# Patient Record
Sex: Male | Born: 1975 | Hispanic: Yes | Marital: Married | Smoking: Former smoker
Health system: Southern US, Community
[De-identification: ages and names within clinical notes are randomized; demographics above are authoritative.]

## PROBLEM LIST (undated history)

## (undated) DIAGNOSIS — T7840XA Allergy, unspecified, initial encounter: Secondary | ICD-10-CM

## (undated) DIAGNOSIS — K469 Unspecified abdominal hernia without obstruction or gangrene: Secondary | ICD-10-CM

## (undated) DIAGNOSIS — E119 Type 2 diabetes mellitus without complications: Secondary | ICD-10-CM

## (undated) DIAGNOSIS — K219 Gastro-esophageal reflux disease without esophagitis: Secondary | ICD-10-CM

## (undated) DIAGNOSIS — I10 Essential (primary) hypertension: Secondary | ICD-10-CM

## (undated) DIAGNOSIS — E785 Hyperlipidemia, unspecified: Secondary | ICD-10-CM

## (undated) HISTORY — DX: Unspecified abdominal hernia without obstruction or gangrene: K46.9

## (undated) HISTORY — DX: Essential (primary) hypertension: I10

## (undated) HISTORY — DX: Gastro-esophageal reflux disease without esophagitis: K21.9

## (undated) HISTORY — DX: Hyperlipidemia, unspecified: E78.5

## (undated) HISTORY — DX: Type 2 diabetes mellitus without complications: E11.9

## (undated) HISTORY — DX: Allergy, unspecified, initial encounter: T78.40XA

---

## 1999-12-14 ENCOUNTER — Emergency Department (HOSPITAL_COMMUNITY): Admission: EM | Admit: 1999-12-14 | Discharge: 1999-12-14 | Payer: Self-pay | Admitting: *Deleted

## 2010-07-26 HISTORY — PX: INGUINAL HERNIA REPAIR: SUR1180

## 2011-04-27 ENCOUNTER — Ambulatory Visit (INDEPENDENT_AMBULATORY_CARE_PROVIDER_SITE_OTHER): Payer: BC Managed Care – PPO | Admitting: Surgery

## 2011-04-27 ENCOUNTER — Encounter (INDEPENDENT_AMBULATORY_CARE_PROVIDER_SITE_OTHER): Payer: Self-pay | Admitting: Surgery

## 2011-04-27 VITALS — BP 140/86 | HR 60 | Temp 99.0°F | Resp 12 | Ht 69.5 in | Wt 234.0 lb

## 2011-04-27 DIAGNOSIS — K4090 Unilateral inguinal hernia, without obstruction or gangrene, not specified as recurrent: Secondary | ICD-10-CM | POA: Insufficient documentation

## 2011-04-27 NOTE — Progress Notes (Signed)
Chief Complaint  Patient presents with  . Other    new pt- eval supra pubic hernia    HPI Dominic [name] is a 35 y.o. male.   HPI Patient referred by Dr. Providence Lanius for evaluation of right groin pain and a right groin bulge. He has noticed it for several weeks. He is in increasingly worsening groin discomfort. He has had no obstructive symptoms. He reports that it always easily reduces. Past Medical History  Diagnosis Date  . Hyperlipidemia   . Hernia     History reviewed. No pertinent past surgical history.  History reviewed. No pertinent family history.  Social History History  Substance Use Topics  . Smoking status: Never Smoker   . Smokeless tobacco: Not on file  . Alcohol Use: 7.2 oz/week    12 Cans of beer per week    No Known Allergies  No current outpatient prescriptions on file.    Review of Systems Review of Systems  HENT: Negative.   Eyes: Negative.   Respiratory: Negative.   Cardiovascular: Negative.   Gastrointestinal: Negative.   Genitourinary: Negative.   Musculoskeletal: Negative.   Neurological: Negative.   Hematological: Negative.   Psychiatric/Behavioral: Negative.     Blood pressure 140/86, pulse 60, temperature 99 F (37.2 C), temperature source Temporal, resp. rate 12, height 5' 9.5" (1.765 m), weight 234 lb (106.142 kg).  Physical Exam Physical Exam  Constitutional: He is oriented to person, place, and time. He appears well-nourished. No distress.  HENT:  Head: Normocephalic and atraumatic.  Right Ear: External ear normal.  Left Ear: External ear normal.  Nose: Nose normal.  Mouth/Throat: Oropharynx is clear and moist. No oropharyngeal exudate.  Eyes: Conjunctivae are normal. Pupils are equal, round, and reactive to light. No scleral icterus.  Neck: Normal range of motion. Neck supple. No tracheal deviation present. No thyromegaly present.  Cardiovascular: Normal rate, regular rhythm, normal heart sounds and intact distal pulses.   No  murmur heard. Pulmonary/Chest: Effort normal and breath sounds normal. No respiratory distress. He has no wheezes. He has no rales.  Abdominal: Soft. Bowel sounds are normal. He exhibits no distension. There is no tenderness. A hernia is present. Hernia confirmed positive in the right inguinal area. Hernia confirmed negative in the left inguinal area.  Musculoskeletal: Normal range of motion. He exhibits no edema and no tenderness.  Lymphadenopathy:    He has no cervical adenopathy.  Neurological: He is alert and oriented to person, place, and time.  Skin: Skin is warm and dry. No rash noted. No erythema.  Psychiatric: His behavior is normal. Judgment normal.    Data Reviewed   Assessment    Patient with easily reducible symptomatic right inguinal hernia    Plan    Repair is recommended with mesh. I discussed both the laparoscopic and open techniques. He wishes to proceed with laparoscopic right inguinal hernia repair with mesh. I discussed the risks of surgery which include but are not limited to bleeding, infection, chronic pain, nerve entrapment, recurrence, etc. Surgery will be scheduled       Kamla Skilton A 04/27/2011, 11:07 AM

## 2011-05-03 DIAGNOSIS — K4090 Unilateral inguinal hernia, without obstruction or gangrene, not specified as recurrent: Secondary | ICD-10-CM

## 2011-05-11 ENCOUNTER — Encounter (INDEPENDENT_AMBULATORY_CARE_PROVIDER_SITE_OTHER): Payer: Self-pay | Admitting: General Surgery

## 2011-05-13 ENCOUNTER — Encounter (INDEPENDENT_AMBULATORY_CARE_PROVIDER_SITE_OTHER): Payer: Self-pay | Admitting: Surgery

## 2011-05-17 ENCOUNTER — Ambulatory Visit (INDEPENDENT_AMBULATORY_CARE_PROVIDER_SITE_OTHER): Payer: BC Managed Care – PPO | Admitting: Surgery

## 2011-05-17 ENCOUNTER — Encounter (INDEPENDENT_AMBULATORY_CARE_PROVIDER_SITE_OTHER): Payer: Self-pay | Admitting: Surgery

## 2011-05-17 VITALS — BP 142/88 | HR 64 | Temp 99.0°F | Resp 12 | Ht 69.5 in | Wt 230.4 lb

## 2011-05-17 DIAGNOSIS — Z09 Encounter for follow-up examination after completed treatment for conditions other than malignant neoplasm: Secondary | ICD-10-CM

## 2011-05-17 NOTE — Progress Notes (Signed)
Subjective:     Patient ID: Dominic [name], male   DOB: [birth year], 35 y.o.   MRN: [id number]  HPI He is here for his first postoperative visit status post laparoscopic right inguinal hernia repair with mesh. He is doing well and has only mild discomfort  Review of Systems     Objective:   Physical Exam On examination, his incisions are healing well. There is some mild swelling along the testicular cord on the right side but no evidence of recurrent hernia.    Assessment:     Patient status post laparoscopic right inguinal hernia repair with mesh    Plan:     I gave him a note to return to work tomorrow. He will still refrain from heavy lifting for one more week. I will see him back as needed

## 2011-08-17 ENCOUNTER — Ambulatory Visit (INDEPENDENT_AMBULATORY_CARE_PROVIDER_SITE_OTHER): Payer: BC Managed Care – PPO | Admitting: Surgery

## 2011-09-20 ENCOUNTER — Ambulatory Visit (INDEPENDENT_AMBULATORY_CARE_PROVIDER_SITE_OTHER): Payer: BC Managed Care – PPO | Admitting: Surgery

## 2011-09-20 ENCOUNTER — Encounter (INDEPENDENT_AMBULATORY_CARE_PROVIDER_SITE_OTHER): Payer: Self-pay | Admitting: Surgery

## 2011-09-20 VITALS — BP 142/104 | HR 68 | Temp 98.6°F | Resp 16 | Ht 69.0 in | Wt 242.4 lb

## 2011-09-20 DIAGNOSIS — R1031 Right lower quadrant pain: Secondary | ICD-10-CM

## 2011-09-20 NOTE — Progress Notes (Signed)
Subjective:     Patient ID: Dominic [name], male   DOB: [birth year], 36 y.o.   MRN: [id number]  HPI He is here for another postoperative visit. He underwent a laparoscopic right inguinal hernia repair with mesh on May 07, 2011. He is having some discomfort in the right groin. He has noticed a small nodule along his testicular cord. He did not hurt every day. He has been doing heavy lifting. He has no obstructive symptoms  Review of Systems     Objective:   Physical Exam On exam, I can feel no evidence of recurrent hernia. Along the testicular cord and the scrotum there is a 1 cm round nodule which is tender and deep. It feels mobile and like it is along the cord.    Assessment:     Right groin pain of uncertain etiology    Plan:     I'm going to place him on anti-inflammatories and Ultram. He will refrain from heavy lifting. I'll see him back for recheck. If this persists I will get an ultrasound to evaluate the nodule

## 2011-10-11 ENCOUNTER — Other Ambulatory Visit (INDEPENDENT_AMBULATORY_CARE_PROVIDER_SITE_OTHER): Payer: Self-pay | Admitting: Surgery

## 2011-10-11 ENCOUNTER — Telehealth (INDEPENDENT_AMBULATORY_CARE_PROVIDER_SITE_OTHER): Payer: Self-pay | Admitting: Surgery

## 2011-10-11 DIAGNOSIS — K469 Unspecified abdominal hernia without obstruction or gangrene: Secondary | ICD-10-CM

## 2011-10-11 NOTE — Telephone Encounter (Signed)
Pt called and states that had hernia sx about 33mo ago, last time he came for a reck he told Dr. Dorthea Cove about a small bulge he felt in the incision, he was told that it was nothing to worry about.  He states he was also told that if necessary he would order an ultra sound if necessary, he feels the bulge and it is bothering him, he does feel some pain and would like to have an [name].  He is in [place] working right now, he is wondering if he could have that scheduled and possible see Dr. Dorthea Cove on the same day since he has to come back from [place], this way he's not going back and forth.

## 2011-10-25 ENCOUNTER — Other Ambulatory Visit (INDEPENDENT_AMBULATORY_CARE_PROVIDER_SITE_OTHER): Payer: Self-pay | Admitting: Surgery

## 2011-10-25 ENCOUNTER — Ambulatory Visit
Admission: RE | Admit: 2011-10-25 | Discharge: 2011-10-25 | Disposition: A | Discharge status: Home / Self Care | Payer: BC Managed Care – PPO | Source: Ambulatory Visit | Attending: Surgery | Admitting: Surgery

## 2011-10-25 DIAGNOSIS — K469 Unspecified abdominal hernia without obstruction or gangrene: Secondary | ICD-10-CM

## 2011-10-26 ENCOUNTER — Encounter (INDEPENDENT_AMBULATORY_CARE_PROVIDER_SITE_OTHER): Payer: Self-pay | Admitting: Surgery

## 2011-10-26 ENCOUNTER — Ambulatory Visit (INDEPENDENT_AMBULATORY_CARE_PROVIDER_SITE_OTHER): Payer: BC Managed Care – PPO | Admitting: Surgery

## 2011-10-26 VITALS — BP 123/76 | HR 84 | Temp 98.0°F | Resp 20 | Ht 69.0 in | Wt 241.8 lb

## 2011-10-26 DIAGNOSIS — R1031 Right lower quadrant pain: Secondary | ICD-10-CM

## 2011-10-26 NOTE — Progress Notes (Signed)
Subjective:     Patient ID: Dominic [name], male   DOB: 06-Dec-1975, 36 y.o.   MRN: [id number]  HPI He continues to have slight pain in his right groin. It is worse when bending and sitting for long periods of time. The pain is described as sharp. He will intermittently noticed a very Small buldge  Review of Systems     Objective:   Physical Exam On exam, there is still some tenderness in the groin. I can feel a small fullness along the testicular cord but cannot reduce this area. His ultrasound suggests this may be some incarcerated fat in the testicular cord    Assessment:     Patient with right groin pain of uncertain etiology    Plan:     This may represent recurrent hernia versus nerve entrapment. I discussed getting a CAT scan versus expectant management versus groin exploration. He wishes that I proceed with groin exploration which feels very reasonable. I discussed the risks of surgery with him in detail. Surgery will thus be scheduled

## 2011-10-29 ENCOUNTER — Telehealth (INDEPENDENT_AMBULATORY_CARE_PROVIDER_SITE_OTHER): Payer: Self-pay | Admitting: General Surgery

## 2011-10-29 NOTE — Telephone Encounter (Signed)
Called in Rx to [facility] on [address] in [place]. [place], [phone] spoke to [place] and called in Hydrocodone 5/325 mg #30 with out refills ok per Dr Magnus Ivan

## 2011-11-08 ENCOUNTER — Other Ambulatory Visit (INDEPENDENT_AMBULATORY_CARE_PROVIDER_SITE_OTHER): Payer: Self-pay | Admitting: Surgery

## 2011-11-08 ENCOUNTER — Encounter (INDEPENDENT_AMBULATORY_CARE_PROVIDER_SITE_OTHER): Payer: Self-pay | Admitting: Surgery

## 2011-11-15 ENCOUNTER — Telehealth (INDEPENDENT_AMBULATORY_CARE_PROVIDER_SITE_OTHER): Payer: Self-pay

## 2011-11-15 ENCOUNTER — Encounter (INDEPENDENT_AMBULATORY_CARE_PROVIDER_SITE_OTHER): Payer: Self-pay

## 2011-11-15 ENCOUNTER — Encounter (HOSPITAL_COMMUNITY): Payer: Self-pay

## 2011-11-15 ENCOUNTER — Encounter (HOSPITAL_COMMUNITY)
Admission: RE | Admit: 2011-11-15 | Discharge: 2011-11-15 | Disposition: A | Discharge status: Home / Self Care | Payer: BC Managed Care – PPO | Source: Ambulatory Visit | Attending: Surgery | Admitting: Surgery

## 2011-11-15 LAB — SURGICAL PCR SCREEN: MRSA, PCR: NEGATIVE

## 2011-11-15 LAB — CBC
HCT: 49 % (ref 39.0–52.0)
Hemoglobin: 17.1 g/dL — ABNORMAL HIGH (ref 13.0–17.0)
WBC: 7.1 10*3/uL (ref 4.0–10.5)

## 2011-11-15 NOTE — Patient Instructions (Signed)
20 Dominic [name]  11/15/2011   Your procedure is scheduled on:  Friday 11/19/2011 at 0730  Report to [facility] at 0530 AM.  Call this number if you have problems the morning of surgery: [phone]   Remember:   Do not eat food:After Midnight.  May have clear liquids:until Midnight .  [name]  Take these medicines the morning of surgery with A SIP OF WATER: none   Do not wear jewelry  Do not wear lotions, powders, or perfumes.  Do not shave 48 hours prior to surgery(women only-shaving legs)  Do not bring valuables to the hospital.  Contacts, dentures or bridgework may not be worn into surgery.  Leave suitcase in the car. After surgery it may be brought to your room.  For patients admitted to the hospital, checkout time is 11:00 AM the day of discharge.   Patients discharged the day of surgery will not be allowed to drive home.  Name and phone number of your driver: Oscar [phone]  Special Instructions: CHG Shower Use Special Wash: 1/2 bottle night before surgery and 1/2 bottle morning of surgery.   Please read over the following fact sheets that you were given: MRSA Information, incentive spirometry sheet, sleep apnea sheet

## 2011-11-15 NOTE — Pre-Procedure Instructions (Signed)
[facility] and talked to Colman Cater, RN to let Pattricia Boss inform Dr. Abigail Miyamoto of abnormal labs CBC in EPIC.

## 2011-11-15 NOTE — Telephone Encounter (Signed)
The patient called in and spoke to [place] because he speaks [place].  He is asking to fly to [place] postop to recover from his surgery 4/26 groin exploration.  He wanted to go a week postop but I advised at least 2 weeks in case he postop complications.

## 2011-11-18 NOTE — H&P (Signed)
Dominic [name] is an 36 y.o. male.   Chief Complaint: right groin pain HPI: patient has had previous right inguinal hernia repaired with mesh.  Now with groin pain and possible bulge.  Ultrasound suggests possible hernia containing fat.  Past Medical History  Diagnosis Date  . Hyperlipidemia   . Hernia     Past Surgical History  Procedure Date  . Inguinal hernia repair 2012    right    No family history on file. Social History:  reports that he has been smoking Cigarettes.  He has a .75 pack-year smoking history. He does not have any smokeless tobacco history on file. He reports that he drinks alcohol. He reports that he does not use illicit drugs.  Allergies: No Known Allergies  No prescriptions prior to admission    No results found for this or any previous visit (from the past 48 hour(s)). No results found.  Review of Systems  All other systems reviewed and are negative.    There were no vitals taken for this visit. Physical Exam  Constitutional: He is oriented to person, place, and time. He appears well-developed and well-nourished. No distress.  Eyes: Conjunctivae are normal. Pupils are equal, round, and reactive to light.  Neck: Normal range of motion. Neck supple. No tracheal deviation present.  Cardiovascular: Normal rate, regular rhythm, normal heart sounds and intact distal pulses.   No murmur heard. Respiratory: Effort normal and breath sounds normal. No respiratory distress.  GI: Soft. Bowel sounds are normal. He exhibits no distension. There is no tenderness.       Slight fullness in right groin with tenderness  Musculoskeletal: Normal range of motion. He exhibits no edema and no tenderness.  Neurological: He is alert and oriented to person, place, and time.  Skin: Skin is warm and dry.     Assessment/Plan Patient with right groin pain, possible recurrent hernia.  Plan right groin exploration and hernia repair with mesh if hernia is present.  Risks  discussed in detail including but not limited to bleeding, infection, never entrapment, chronic pain, recurrence, etc. Discussed.  Likelihood of success unknown.  Tesla Bochicchio A 11/18/2011, 9:47 PM

## 2011-11-18 NOTE — Anesthesia Preprocedure Evaluation (Addendum)
Anesthesia Evaluation  Patient identified by MRN, date of birth, ID band Patient awake    Reviewed: Allergy & Precautions, H&P , NPO status , Patient's Chart, lab work & pertinent test results  Airway Mallampati: II TM Distance: >3 FB Neck ROM: full    Dental No notable dental hx. (+) Teeth Intact, Dental Advisory Given and Caps,    Pulmonary neg pulmonary ROS,  breath sounds clear to auscultation  Pulmonary exam normal       Cardiovascular Exercise Tolerance: Good negative cardio ROS  Rhythm:regular Rate:Normal     Neuro/Psych negative neurological ROS  negative psych ROS   GI/Hepatic negative GI ROS, Neg liver ROS,   Endo/Other  negative endocrine ROS  Renal/GU negative Renal ROS  negative genitourinary   Musculoskeletal   Abdominal   Peds  Hematology negative hematology ROS (+)   Anesthesia Other Findings   Reproductive/Obstetrics negative OB ROS                          Anesthesia Physical Anesthesia Plan  ASA: I  Anesthesia Plan: General   Post-op Pain Management:    Induction: Intravenous  Airway Management Planned: LMA  Additional Equipment:   Intra-op Plan:   Post-operative Plan:   Informed Consent: I have reviewed the patients History and Physical, chart, labs and discussed the procedure including the risks, benefits and alternatives for the proposed anesthesia with the patient or authorized representative who has indicated his/her understanding and acceptance.   Dental Advisory Given  Plan Discussed with: CRNA and Surgeon  Anesthesia Plan Comments:         Anesthesia Quick Evaluation  

## 2011-11-19 ENCOUNTER — Encounter (HOSPITAL_COMMUNITY)
Admission: RE | Disposition: A | Discharge status: Home / Self Care | Payer: Self-pay | Source: Ambulatory Visit | Attending: Surgery

## 2011-11-19 ENCOUNTER — Ambulatory Visit (HOSPITAL_COMMUNITY): Payer: BC Managed Care – PPO | Admitting: Anesthesiology

## 2011-11-19 ENCOUNTER — Ambulatory Visit (HOSPITAL_COMMUNITY)
Admission: RE | Admit: 2011-11-19 | Discharge: 2011-11-19 | Disposition: A | Discharge status: Home / Self Care | Payer: BC Managed Care – PPO | Source: Ambulatory Visit | Attending: Surgery | Admitting: Surgery

## 2011-11-19 ENCOUNTER — Encounter (HOSPITAL_COMMUNITY): Payer: Self-pay | Admitting: Anesthesiology

## 2011-11-19 ENCOUNTER — Encounter (HOSPITAL_COMMUNITY): Payer: Self-pay | Admitting: *Deleted

## 2011-11-19 DIAGNOSIS — K4090 Unilateral inguinal hernia, without obstruction or gangrene, not specified as recurrent: Secondary | ICD-10-CM | POA: Insufficient documentation

## 2011-11-19 DIAGNOSIS — K4091 Unilateral inguinal hernia, without obstruction or gangrene, recurrent: Secondary | ICD-10-CM

## 2011-11-19 DIAGNOSIS — D214 Benign neoplasm of connective and other soft tissue of abdomen: Secondary | ICD-10-CM | POA: Insufficient documentation

## 2011-11-19 HISTORY — PX: INGUINAL HERNIA REPAIR: SHX194

## 2011-11-19 HISTORY — PX: GROIN DISSECTION: SHX5250

## 2011-11-19 SURGERY — EXPLORATION, INGUINAL REGION
Anesthesia: General | Site: Groin | Laterality: Right | Wound class: Clean

## 2011-11-19 MED ORDER — LIDOCAINE HCL (CARDIAC) 20 MG/ML IV SOLN
INTRAVENOUS | Status: DC | PRN
  Administered 2011-11-19: 100 mg via INTRAVENOUS

## 2011-11-19 MED ORDER — MIDAZOLAM HCL 5 MG/5ML IJ SOLN
INTRAMUSCULAR | Status: DC | PRN
  Administered 2011-11-19: 2 mg via INTRAVENOUS

## 2011-11-19 MED ORDER — 0.9 % SODIUM CHLORIDE (POUR BTL) OPTIME
TOPICAL | Status: DC | PRN
  Administered 2011-11-19: 1000 mL

## 2011-11-19 MED ORDER — OXYCODONE HCL 5 MG PO TABS
ORAL_TABLET | ORAL | Status: AC
  Administered 2011-11-19: 5 mg
  Filled 2011-11-19: qty 1

## 2011-11-19 MED ORDER — FENTANYL CITRATE 0.05 MG/ML IJ SOLN
INTRAMUSCULAR | Status: AC
  Filled 2011-11-19: qty 2

## 2011-11-19 MED ORDER — ACETAMINOPHEN 10 MG/ML IV SOLN
INTRAVENOUS | Status: AC
  Filled 2011-11-19: qty 100

## 2011-11-19 MED ORDER — FENTANYL CITRATE 0.05 MG/ML IJ SOLN
INTRAMUSCULAR | Status: DC | PRN
  Administered 2011-11-19 (×5): 50 ug via INTRAVENOUS

## 2011-11-19 MED ORDER — LACTATED RINGERS IV SOLN
INTRAVENOUS | Status: DC
  Administered 2011-11-19: 1000 mL via INTRAVENOUS

## 2011-11-19 MED ORDER — DEXAMETHASONE SODIUM PHOSPHATE 10 MG/ML IJ SOLN
INTRAMUSCULAR | Status: DC | PRN
  Administered 2011-11-19: 10 mg via INTRAVENOUS

## 2011-11-19 MED ORDER — OXYCODONE-ACETAMINOPHEN 7.5-325 MG PO TABS: 1.0000 | ORAL_TABLET | ORAL | Status: AC | PRN

## 2011-11-19 MED ORDER — PROPOFOL 10 MG/ML IV EMUL
INTRAVENOUS | Status: DC | PRN
  Administered 2011-11-19: 200 mg via INTRAVENOUS
  Administered 2011-11-19: 50 mg via INTRAVENOUS
  Administered 2011-11-19: 100 mg via INTRAVENOUS

## 2011-11-19 MED ORDER — CEFAZOLIN SODIUM-DEXTROSE 2-3 GM-% IV SOLR
2.0000 g | INTRAVENOUS | Status: AC
  Administered 2011-11-19: 2 g via INTRAVENOUS

## 2011-11-19 MED ORDER — ONDANSETRON HCL 4 MG/2ML IJ SOLN
INTRAMUSCULAR | Status: DC | PRN
  Administered 2011-11-19: 4 mg via INTRAVENOUS

## 2011-11-19 MED ORDER — FENTANYL CITRATE 0.05 MG/ML IJ SOLN
25.0000 ug | INTRAMUSCULAR | Status: DC | PRN
  Administered 2011-11-19 (×4): 25 ug via INTRAVENOUS

## 2011-11-19 MED ORDER — OXYCODONE HCL 5 MG PO TABS: 5.0000 mg | ORAL_TABLET | ORAL | Status: DC | PRN

## 2011-11-19 MED ORDER — LACTATED RINGERS IV SOLN
INTRAVENOUS | Status: DC | PRN
  Administered 2011-11-19: 07:00:00 via INTRAVENOUS

## 2011-11-19 MED ORDER — BUPIVACAINE-EPINEPHRINE 0.5% -1:200000 IJ SOLN
INTRAMUSCULAR | Status: DC | PRN
  Administered 2011-11-19: 20 mL

## 2011-11-19 MED ORDER — CEFAZOLIN SODIUM-DEXTROSE 2-3 GM-% IV SOLR
INTRAVENOUS | Status: AC
  Filled 2011-11-19: qty 50

## 2011-11-19 MED ORDER — ACETAMINOPHEN 10 MG/ML IV SOLN
INTRAVENOUS | Status: DC | PRN
  Administered 2011-11-19: 1000 mg via INTRAVENOUS

## 2011-11-19 MED ORDER — BUPIVACAINE-EPINEPHRINE (PF) 0.5% -1:200000 IJ SOLN
INTRAMUSCULAR | Status: AC
  Filled 2011-11-19: qty 10

## 2011-11-19 MED ORDER — SODIUM CHLORIDE 0.9 % IV SOLN: 250.0000 mL | INTRAVENOUS | Status: DC | PRN

## 2011-11-19 SURGICAL SUPPLY — 50 items
APL SKNCLS STERI-STRIP NONHPOA (GAUZE/BANDAGES/DRESSINGS) ×1
APPLICATOR COTTON TIP 6IN STRL (MISCELLANEOUS) IMPLANT
BENZOIN TINCTURE PRP APPL 2/3 (GAUZE/BANDAGES/DRESSINGS) ×1 IMPLANT
BLADE SURG 10 STRL SS (BLADE) ×2 IMPLANT
BLADE SURG ROTATE 9660 (MISCELLANEOUS) IMPLANT
CANISTER SUCTION 1200CC (MISCELLANEOUS) IMPLANT
CANISTER SUCTION 2500CC (MISCELLANEOUS) IMPLANT
CLEANER CAUTERY TIP 5X5 PAD (MISCELLANEOUS) ×1 IMPLANT
CLOTH BEACON ORANGE TIMEOUT ST (SAFETY) ×2 IMPLANT
CLSR STERI-STRIP ANTIMIC 1/2X4 (GAUZE/BANDAGES/DRESSINGS) ×1 IMPLANT
COVER MAYO STAND STRL (DRAPES) ×2 IMPLANT
COVER TABLE BACK 60X90 (DRAPES) ×2 IMPLANT
DRAIN PENROSE 18X1/2 LTX STRL (DRAIN) ×2 IMPLANT
DRAPE INCISE IOBAN 66X45 STRL (DRAPES) ×2 IMPLANT
DRAPE PED LAPAROTOMY (DRAPES) ×2 IMPLANT
DRSG TEGADERM 4X4.75 (GAUZE/BANDAGES/DRESSINGS) ×1 IMPLANT
ELECT REM PT RETURN 9FT ADLT (ELECTROSURGICAL) ×2
ELECTRODE REM PT RTRN 9FT ADLT (ELECTROSURGICAL) ×1 IMPLANT
GLOVE INDICATOR 8.0 STRL GRN (GLOVE) ×2 IMPLANT
GLOVE OPTIFIT SS 7.5 STRL LX (GLOVE) ×2 IMPLANT
GOWN W/COTTON TOWEL STD LRG (GOWNS) ×2 IMPLANT
GOWN XL W/COTTON TOWEL STD (GOWNS) ×2 IMPLANT
MESH PARIETEX PROGRIP RIGHT (Mesh General) ×1 IMPLANT
NDL HYPO 25X1 1.5 SAFETY (NEEDLE) ×1 IMPLANT
NEEDLE HYPO 22GX1.5 SAFETY (NEEDLE) ×2 IMPLANT
NEEDLE HYPO 25X1 1.5 SAFETY (NEEDLE) ×2 IMPLANT
NS IRRIG 500ML POUR BTL (IV SOLUTION) ×2 IMPLANT
PACK BASIN DAY SURGERY FS (CUSTOM PROCEDURE TRAY) ×2 IMPLANT
PAD CLEANER CAUTERY TIP 5X5 (MISCELLANEOUS) ×1
PENCIL BUTTON HOLSTER BLD 10FT (ELECTRODE) ×2 IMPLANT
SPONGE GAUZE 4X4 12PLY (GAUZE/BANDAGES/DRESSINGS) ×1 IMPLANT
SPONGE LAP 4X18 X RAY DECT (DISPOSABLE) ×2 IMPLANT
STAPLER VISISTAT 35W (STAPLE) IMPLANT
STRIP CLOSURE SKIN 1/2X4 (GAUZE/BANDAGES/DRESSINGS) ×1 IMPLANT
SUT MNCRL AB 4-0 PS2 18 (SUTURE) ×2 IMPLANT
SUT PROLENE 2 0 CT2 30 (SUTURE) ×4 IMPLANT
SUT SILK 2 0 SH (SUTURE) ×1 IMPLANT
SUT SURG 0 T 19/GS 22 1969 62 (SUTURE) IMPLANT
SUT VIC AB 3-0 54X BRD REEL (SUTURE) IMPLANT
SUT VIC AB 3-0 BRD 54 (SUTURE)
SUT VIC AB 3-0 CT1 27 (SUTURE) ×2
SUT VIC AB 3-0 CT1 27XBRD (SUTURE) ×1 IMPLANT
SUT VIC AB 3-0 CT1 TAPERPNT 27 (SUTURE) IMPLANT
SYR BULB IRRIGATION 50ML (SYRINGE) IMPLANT
SYR CONTROL 10ML LL (SYRINGE) IMPLANT
TOWEL OR 17X24 6PK STRL BLUE (TOWEL DISPOSABLE) ×4 IMPLANT
TRAY DSU PREP LF (CUSTOM PROCEDURE TRAY) ×2 IMPLANT
TUBE CONNECTING 12X1/4 (SUCTIONS) IMPLANT
WATER STERILE IRR 500ML POUR (IV SOLUTION) ×1 IMPLANT
YANKAUER SUCT BULB TIP NO VENT (SUCTIONS) IMPLANT

## 2011-11-19 NOTE — Progress Notes (Signed)
Pt up in room and ambulated down hall to bathroom.  Pt voided large amount clear yellow urine. Pt tolerated well.  Pt is requesting a second pain pill.

## 2011-11-19 NOTE — Op Note (Signed)
GROIN EXPLORATION, HERNIA REPAIR INGUINAL ADULT  Procedure Note  Dominic [name] 11/19/2011   Pre-op Diagnosis: right groin pain     Post-op Diagnosis: recurrent right inguinal hernia and right groin mass  Procedure(s): GROIN EXPLORATION HERNIA REPAIR INGUINAL ADULT RIGHT WITH MESH EXCISION RIGHT GROIN MASS  Surgeon(s): Shelly Rubenstein, MD  Anesthesia: General  Staff:  Gerda Diss, RN - Circulator Jessica Sweat Mann, RN - Scrub Person  Estimated Blood Loss: Minimal               Specimens:  Hernia sac and groin mass  Indications: This is [name] 36 year old gentleman who has had [name] previous laparoscopic right inguinal hernia repair with mesh. He now has increasing groin pain and [name] right groin mass. An ultrasound suggested [name] recurrent hernia.  Findings: The patient was found to have [name] firm, fatty mass along the testicular cord on the right side. He also had [name] recurrent indirect inguinal hernia.  Procedure: The patient was brought to the operating room and identified as the correct patient. He was placed supine on the operating room table and general anesthesia was induced. I performed an ilioinguinal nerve block with Marcaine and anesthetized with Marcaine as well after the patient was prepped and draped. I then made [name] longitudinal incision in the right groin. I took this down through Scarpa's fascia with electrocautery. The testicular cord and its structures were identified and controlled [name] Penrose drain. The patient had [name] recurrent indirect hernia sac which was then separated from cord structures. He also had [name] hard, fatty mass along his testicular cord. This did not appear to be consistent with the testicle. I examined the scrotum and both testicles appeared to be intact. At this point I excised the hernia sac and the mass and control the base of the sac and the mass with [name] silk suture. I then sent the mass and the sac to pathology for evaluation. I then brought [name] piece of pro-grip  Prolene mesh to the field and placed around the cord structures. I secured it to the pubic tubercle with [name] 2-0 Vicryl suture. Good coverage of the inguinal floor appeared to be achieved. I then closed tissue of the top with [name] running 2-0 Vicryl suture. I anesthetized the skin further with Marcaine. Scarpa's fascia then closed with 3-0 Vicryl sutures and the skin was closed with [name] running 4-0 Monocryl. Steri-Strips, gauze, and Tegaderm were then applied. I again examined the scrotum both testicles were found to be in place. The patient tolerated the procedure well. All the counts were correct at the end of the procedure. The patient was then extubated in the operating room and taken in [name] stable condition to the recovery room.          Dominic [name]   Date: 11/19/2011  Time: 7:54 AM

## 2011-11-19 NOTE — Anesthesia Postprocedure Evaluation (Signed)
  Anesthesia Post-op Note  Patient: Dominic [name]  Procedure(s) Performed: Procedure(s) (LRB): GROIN EXPLORATION (Right) HERNIA REPAIR INGUINAL ADULT (Right)  Patient Location: PACU  Anesthesia Type: General  Level of Consciousness: awake and alert   Airway and Oxygen Therapy: Patient Spontanous Breathing  Post-op Pain: mild  Post-op Assessment: Post-op Vital signs reviewed, Patient's Cardiovascular Status Stable, Respiratory Function Stable, Patent Airway and No signs of Nausea or vomiting  Post-op Vital Signs: stable  Complications: No apparent anesthesia complications

## 2011-11-19 NOTE — Progress Notes (Signed)
Pt's ice pack refilled and placed on pt's right groin for comfort.

## 2011-11-19 NOTE — Transfer of Care (Signed)
Immediate Anesthesia Transfer of Care Note  Patient: Dominic [name]  Procedure(s) Performed: Procedure(s) (LRB): GROIN EXPLORATION (Right) HERNIA REPAIR INGUINAL ADULT (Right)  Patient Location: PACU  Anesthesia Type: General  Level of Consciousness: sedated, patient cooperative and responds to stimulaton  Airway & Oxygen Therapy: Patient Spontanous Breathing and Patient connected to face mask oxgen  Post-op Assessment: Report given to PACU RN and Post -op Vital signs reviewed and stable  Post vital signs: Reviewed and stable  Complications: No apparent anesthesia complications

## 2011-11-19 NOTE — Interval H&P Note (Signed)
History and Physical Interval Note:  He has had no change in his history or exam  11/19/2011 7:01 AM  Dominic [name]  has presented today for surgery, with the diagnosis of right groin pain  The various methods of treatment have been discussed with the patient and family. After consideration of risks, benefits and other options for treatment, the patient has consented to  Procedure(s) (LRB): GROIN EXPLORATION (Right) as a surgical intervention .  The patients' history has been reviewed, patient examined, no change in status, stable for surgery.  I have reviewed the patients' chart and labs.  Questions were answered to the patient's satisfaction.     [name]

## 2011-11-19 NOTE — Discharge Instructions (Signed)
[facility] _______[facility]  UMBILICAL OR INGUINAL HERNIA REPAIR: POST OP INSTRUCTIONS  Always review your discharge instruction sheet given to you by the facility where your surgery was performed. IF YOU HAVE DISABILITY OR FAMILY LEAVE FORMS, YOU MUST BRING THEM TO THE OFFICE FOR PROCESSING.   DO NOT GIVE THEM TO YOUR DOCTOR.  1. A  prescription for pain medication may be given to you upon discharge.  Take your pain medication as prescribed, if needed.  If narcotic pain medicine is not needed, then you may take acetaminophen (Tylenol) or ibuprofen (Advil) as needed. 2. Take your usually prescribed medications unless otherwise directed. 3. If you need a refill on your pain medication, please contact your pharmacy.  They will contact our office to request authorization. Prescriptions will not be filled after 5 pm or on week-ends. 4. You should follow a light diet the first 24 hours after arrival home, such as soup and crackers, etc.  Be sure to include lots of fluids daily.  Resume your normal diet the day after surgery. 5. Most patients will experience some swelling and bruising around the umbilicus or in the groin and scrotum.  Ice packs and reclining will help.  Swelling and bruising can take several days to resolve.  6. It is common to experience some constipation if taking pain medication after surgery.  Increasing fluid intake and taking a stool softener (such as Colace) will usually help or prevent this problem from occurring.  A mild laxative (Milk of Magnesia or Miralax) should be taken according to package directions if there are no bowel movements after 48 hours. 7. Unless discharge instructions indicate otherwise, you may remove your bandages 24-48 hours after surgery, and you may shower at that time.  You may have steri-strips (small skin tapes) in place directly over the incision.  These strips should be left on the skin for 7-10 days.  If your surgeon used skin glue on the  incision, you may shower in 24 hours.  The glue will flake off over the next 2-3 weeks.  Any sutures or staples will be removed at the office during your follow-up visit. 8. ACTIVITIES:  You may resume regular (light) daily activities beginning the next day--such as daily self-care, walking, climbing stairs--gradually increasing activities as tolerated.  You may have sexual intercourse when it is comfortable.  Refrain from any heavy lifting or straining until approved by your doctor. a. You may drive when you are no longer taking prescription pain medication, you can comfortably wear a seatbelt, and you can safely maneuver your car and apply brakes. b. RETURN TO WORK:  _____HE CAN DO NO LIFTING GREATER THAN 20 POUNDS FOR 4 TO 6 WEEKS_______________________________________________ 9. You should see your doctor in the office for a follow-up appointment approximately 2-3 weeks after your surgery.  Make sure that you call for this appointment within a day or two after you arrive home to insure a convenient appointment time. 10. OTHER INSTRUCTIONS: ice pack as needed 11. You may fly on plane in 1 week __________________________________________________________________________________________________________________________________________________________________________________________  WHEN TO CALL YOUR DOCTOR: 1. Fever over 101.0 2. Inability to urinate 3. Nausea and/or vomiting 4. Extreme swelling or bruising 5. Continued bleeding from incision. 6. Increased pain, redness, or drainage from the incision  The clinic staff is available to answer your questions during regular business hours.  Please don't hesitate to call and ask to speak to one of the nurses for clinical concerns.  If you have a medical emergency,  go to the nearest emergency room or call 911.  A surgeon from [facility] is always on call at the hospital   [address], [address], [place], [place]  [address] ?  [address] [address], [place], [place]   [address] [phone] ? [phone] ? FAX [phone] Web site: [email or web address]    TO WHOM IT MAY CONCERN,  MR Cerney CAN DO NO LIFTING GREATER THAN 20 POUND FOR 4 TO 6 WEEKS.  IF YOU HAVE ANY QUESTIONS, PLEASE FEEL FREE TO CALL.  DR. Abigail Miyamoto

## 2011-11-24 ENCOUNTER — Encounter (HOSPITAL_COMMUNITY): Payer: Self-pay | Admitting: Surgery

## 2011-11-25 ENCOUNTER — Ambulatory Visit (INDEPENDENT_AMBULATORY_CARE_PROVIDER_SITE_OTHER): Payer: BC Managed Care – PPO | Admitting: Surgery

## 2011-11-25 ENCOUNTER — Encounter (INDEPENDENT_AMBULATORY_CARE_PROVIDER_SITE_OTHER): Payer: Self-pay | Admitting: Surgery

## 2011-11-25 VITALS — BP 125/86 | HR 87 | Temp 97.3°F | Ht 69.0 in | Wt 240.0 lb

## 2011-11-25 DIAGNOSIS — Z09 Encounter for follow-up examination after completed treatment for conditions other than malignant neoplasm: Secondary | ICD-10-CM

## 2011-11-25 NOTE — Progress Notes (Signed)
Subjective:     Patient ID: Dominic [name], male   DOB: [birth year], 36 y.o.   MRN: [id number]  HPI He is here for a postop visit status post open right inguinal hernia. Mesh as well as removal of a large lipoma along the cord. He is feeling much better.  Review of Systems     Objective:   Physical Exam On exam, his incision is healing well. There is moderate swelling. There is no evidence of recurrence    Assessment:     A. status post right inguinal hernia with mesh for recurrent hernia    Plan:     I will see him back in 2 months. I renewed his hydrocodone. He will refrain from heavy lifting for 3 more weeks

## 2012-01-21 ENCOUNTER — Encounter (INDEPENDENT_AMBULATORY_CARE_PROVIDER_SITE_OTHER): Payer: BC Managed Care – PPO | Admitting: Surgery

## 2012-01-31 ENCOUNTER — Other Ambulatory Visit (INDEPENDENT_AMBULATORY_CARE_PROVIDER_SITE_OTHER): Payer: Self-pay | Admitting: General Surgery

## 2012-01-31 ENCOUNTER — Telehealth (INDEPENDENT_AMBULATORY_CARE_PROVIDER_SITE_OTHER): Payer: Self-pay | Admitting: General Surgery

## 2012-01-31 ENCOUNTER — Encounter (INDEPENDENT_AMBULATORY_CARE_PROVIDER_SITE_OTHER): Payer: Self-pay | Admitting: General Surgery

## 2012-01-31 NOTE — Telephone Encounter (Signed)
Called in Rx to [facility] in [place]. [place], [phone].  called in Hydrocodone 5/325 #40 w/o refills per Dr Magnus Ivan and also told pt that I faxed his note to his work place

## 2012-03-06 ENCOUNTER — Ambulatory Visit (INDEPENDENT_AMBULATORY_CARE_PROVIDER_SITE_OTHER): Payer: BC Managed Care – PPO | Admitting: Surgery

## 2012-03-06 ENCOUNTER — Encounter (INDEPENDENT_AMBULATORY_CARE_PROVIDER_SITE_OTHER): Payer: Self-pay | Admitting: Surgery

## 2012-03-06 VITALS — BP 126/80 | HR 82 | Temp 98.4°F | Resp 18 | Ht 69.0 in | Wt 241.4 lb

## 2012-03-06 DIAGNOSIS — R109 Unspecified abdominal pain: Secondary | ICD-10-CM

## 2012-03-06 DIAGNOSIS — G8918 Other acute postprocedural pain: Secondary | ICD-10-CM

## 2012-03-06 NOTE — Progress Notes (Signed)
Subjective:     Patient ID: Dominic [name], male   DOB: [birth year], 36 y.o.   MRN: [id number]  HPI He is still having some postoperative discomfort in the right groin but he continues to slowly improve. He has returned to work  Review of Systems     Objective:   Physical Exam There is much less swelling in the right groin. I can feel no evidence of recurrence    Assessment:     Patient status post right inguinal hernia repair with mesh and removal of a small benign mass on the testicular cord    Plan:     I renewed his hydrocodone. I will see him back as needed

## 2014-04-06 ENCOUNTER — Ambulatory Visit (INDEPENDENT_AMBULATORY_CARE_PROVIDER_SITE_OTHER): Payer: BC Managed Care – PPO | Admitting: Family Medicine

## 2014-04-06 VITALS — BP 122/84 | HR 58 | Temp 98.1°F | Resp 16 | Ht 68.0 in | Wt 238.0 lb

## 2014-04-06 DIAGNOSIS — Z0000 Encounter for general adult medical examination without abnormal findings: Secondary | ICD-10-CM

## 2014-04-06 LAB — POCT CBC
GRANULOCYTE PERCENT: 66.7 % (ref 37–80)
HEMATOCRIT: 52.5 % (ref 43.5–53.7)
HEMOGLOBIN: 17.6 g/dL (ref 14.1–18.1)
Lymph, poc: 2 (ref 0.6–3.4)
MCH: 31.1 pg (ref 27–31.2)
MCHC: 33.6 g/dL (ref 31.8–35.4)
MCV: 92.6 fL (ref 80–97)
MID (CBC): 0.2 (ref 0–0.9)
MPV: 8.3 fL (ref 0–99.8)
PLATELET COUNT, POC: 204 10*3/uL (ref 142–424)
POC Granulocyte: 4.5 (ref 2–6.9)
POC LYMPH PERCENT: 29.9 %L (ref 10–50)
POC MID %: 3.4 %M (ref 0–12)
RBC: 5.66 M/uL (ref 4.69–6.13)
RDW, POC: 12.5 %
WBC: 6.8 10*3/uL (ref 4.6–10.2)

## 2014-04-06 LAB — COMPREHENSIVE METABOLIC PANEL
ALT: 32 U/L (ref 0–53)
AST: 25 U/L (ref 0–37)
Albumin: 4.5 g/dL (ref 3.5–5.2)
Alkaline Phosphatase: 68 U/L (ref 39–117)
BILIRUBIN TOTAL: 2.1 mg/dL — AB (ref 0.2–1.2)
BUN: 11 mg/dL (ref 6–23)
CALCIUM: 9.8 mg/dL (ref 8.4–10.5)
CO2: 23 meq/L (ref 19–32)
Chloride: 103 mEq/L (ref 96–112)
Creat: 0.65 mg/dL (ref 0.50–1.35)
Glucose, Bld: 101 mg/dL — ABNORMAL HIGH (ref 70–99)
POTASSIUM: 4 meq/L (ref 3.5–5.3)
SODIUM: 137 meq/L (ref 135–145)
Total Protein: 7.8 g/dL (ref 6.0–8.3)

## 2014-04-06 LAB — LIPID PANEL
CHOL/HDL RATIO: 4.6 ratio
Cholesterol: 176 mg/dL (ref 0–200)
HDL: 38 mg/dL — AB (ref >39)
LDL Cholesterol: 91 mg/dL (ref 0–99)
TRIGLYCERIDES: 233 mg/dL — AB (ref <150)
VLDL: 47 mg/dL — ABNORMAL HIGH (ref 0–40)

## 2014-04-06 LAB — IFOBT (OCCULT BLOOD): IFOBT: NEGATIVE

## 2014-04-06 LAB — POCT GLYCOSYLATED HEMOGLOBIN (HGB A1C): HEMOGLOBIN A1C: 5.2

## 2014-04-06 NOTE — Progress Notes (Signed)
Physical examination:  History: Patient is here for a job physical examination and he would like her labs checked. He has lost about 618 pounds over the last year he says. He wants to lose more. He does have a history of fatty liver  Past medical history: Operations: Umbilical and inguinal herniorrhaphies Medical illnesses: None Allergies: None Regular medications: None  Family history: Both parents live in [place] but have diabetes and hyperlipidemia  Social history: He works doing Teacher, music for [employer]. His wife and 3 children are in [place]. He is trying to get more regular exercise in the right and drink less beer. He lives with some friends here. He has not been back to [place] to see his family for a year or more. They are trying to get these is to come to the [name]. He is a [name] citizen.  Review of systems: Constitutional: Unremarkable H. EENT: Unremarkable Cardiovascular: Unremarkable Respiratory: From his weight he gets a little short of breath on exertion. Gastrointestinal: Unremarkable Genitourinary: Unremarkable Musculoskeletal: Unremarkable Neurologic: Unremarkable Dermatologic: Unremarkable Hematologic: Unremarkable Psychiatric: Unremarkable   Physical examination: Well-developed obese man in no acute distress. TMs are normal. Eyes PERRLA. Fundi benign. Throat clear. Neck supple without nodes thyromegaly. No carotid bruits. Chest is clear to auscultation. Heart regular without murmurs gallops or arrhythmias. And soft without mass tenderness. Normal male external genitalia with no hernias. Digital rectal exam his prostate be normal in size shape. Extremities unremarkable. Skin unremarkable. Joints appear unremarkable. He hurts some in his knees.  Assessment: Annual physical examination Obesity History of inguinal and umbilical herniorrhaphies Arthralgias of knees Family history of diabetes History of fatty liver  Plan: Stressed the need to lose weight. Check  labs.  Results for orders placed in visit on 04/06/14  POCT CBC      Result Value Ref Range   WBC 6.8  4.6 - 10.2 K/uL   Lymph, poc 2.0  0.6 - 3.4   POC LYMPH PERCENT 29.9  10 - 50 %L   MID (cbc) 0.2  0 - 0.9   POC MID % 3.4  0 - 12 %M   POC Granulocyte 4.5  2 - 6.9   Granulocyte percent 66.7  37 - 80 %G   RBC 5.66  4.69 - 6.13 M/uL   Hemoglobin 17.6  14.1 - 18.1 g/dL   HCT, POC 52.5  43.5 - 53.7 %   MCV 92.6  80 - 97 fL   MCH, POC 31.1  27 - 31.2 pg   MCHC 33.6  31.8 - 35.4 g/dL   RDW, POC 12.5     Platelet Count, POC 204  142 - 424 K/uL   MPV 8.3  0 - 99.8 fL  IFOBT (OCCULT BLOOD)      Result Value Ref Range   IFOBT Negative    POCT GLYCOSYLATED HEMOGLOBIN (HGB A1C)      Result Value Ref Range   Hemoglobin A1C 5.2

## 2014-04-06 NOTE — Patient Instructions (Addendum)
Get regular exercise and try to eat less and drink less beer.  Take Tylenol or ibuprofen or Aleve if you're having a lot of trouble with knee pain.  I will let you know the results of the rest your labs in a few days.  Get an annual physical.  Your blood tests do not show any evidence of diabetes.

## 2014-04-07 ENCOUNTER — Encounter: Payer: Self-pay | Admitting: Family Medicine

## 2014-04-17 ENCOUNTER — Telehealth: Payer: Self-pay | Admitting: *Deleted

## 2014-04-17 NOTE — Telephone Encounter (Signed)
Pt called to inquire about recent lab results. Looks like they haven't yet been reviewed , please advise.

## 2014-04-21 NOTE — Telephone Encounter (Signed)
The labs were sent as well as called on 9/13.  Please resend a copy of the labs.

## 2014-05-31 ENCOUNTER — Ambulatory Visit (INDEPENDENT_AMBULATORY_CARE_PROVIDER_SITE_OTHER): Payer: BC Managed Care – PPO | Admitting: Family Medicine

## 2014-05-31 ENCOUNTER — Ambulatory Visit (INDEPENDENT_AMBULATORY_CARE_PROVIDER_SITE_OTHER): Payer: BC Managed Care – PPO

## 2014-05-31 VITALS — BP 126/76 | HR 56 | Temp 98.5°F | Resp 16 | Ht 68.0 in | Wt 229.0 lb

## 2014-05-31 DIAGNOSIS — R1011 Right upper quadrant pain: Secondary | ICD-10-CM

## 2014-05-31 DIAGNOSIS — Z23 Encounter for immunization: Secondary | ICD-10-CM

## 2014-05-31 DIAGNOSIS — K219 Gastro-esophageal reflux disease without esophagitis: Secondary | ICD-10-CM

## 2014-05-31 DIAGNOSIS — S39012A Strain of muscle, fascia and tendon of lower back, initial encounter: Secondary | ICD-10-CM

## 2014-05-31 DIAGNOSIS — E669 Obesity, unspecified: Secondary | ICD-10-CM

## 2014-05-31 LAB — POCT CBC
GRANULOCYTE PERCENT: 63.5 % (ref 37–80)
HCT, POC: 46.9 % (ref 43.5–53.7)
HEMOGLOBIN: 16.1 g/dL (ref 14.1–18.1)
Lymph, poc: 2.8 (ref 0.6–3.4)
MCH: 31.3 pg — AB (ref 27–31.2)
MCHC: 34.4 g/dL (ref 31.8–35.4)
MCV: 91.2 fL (ref 80–97)
MID (cbc): 0.3 (ref 0–0.9)
MPV: 8.8 fL (ref 0–99.8)
POC Granulocyte: 5.4 (ref 2–6.9)
POC LYMPH PERCENT: 32.6 %L (ref 10–50)
POC MID %: 3.9 % (ref 0–12)
Platelet Count, POC: 217 10*3/uL (ref 142–424)
RBC: 5.15 M/uL (ref 4.69–6.13)
RDW, POC: 12.7 %
WBC: 8.5 10*3/uL (ref 4.6–10.2)

## 2014-05-31 MED ORDER — METHOCARBAMOL 500 MG PO TABS: 500.0000 mg | ORAL_TABLET | Freq: Three times a day (TID) | ORAL | Status: DC | PRN

## 2014-05-31 NOTE — Patient Instructions (Signed)
We are going to set you up for a gallbladder ultrasound over the next few days.  If you have any worsening of your symptoms of fever please seek care right away I will be in touch with the rest of your labs Use the robaxin as needed to relax your back muscles- let me know if you are not getting better.

## 2014-05-31 NOTE — Progress Notes (Signed)
[facility] [address] [place] [address] [phone]  Date:  05/31/2014   Name:  Dominic [name]   DOB:  1975-08-26   MRN:  [id number]  PCP:  PROVIDER NOT IN SYSTEM    Chief Complaint: Abdominal Pain; Flu Vaccine; and Medication Refill   History of Present Illness:  Dominic [name] is a 38 y.o. very pleasant male patient who presents with the following:  He has noted some RUQ pain for about 3 weeks.  It is there "pretty much all the time."  Not worse after eating.  He feels worse when he lies down at night. He is trying to avoid greasy foods and has lost some weight due to his diet changes He is still taking omeprazole and does not feel that he has any heartburn.  However he does need a refill  No nausea or vomiting.   No chest pain, no SOB  He has also noted a feeling of muscle spasm in his left lower back.  The pain does not run down his leg, no numbness or weakness  Wt Readings from Last 3 Encounters:  05/31/14 229 lb (103.874 kg)  04/06/14 238 lb (107.956 kg)  03/06/12 241 lb 6.4 oz (109.498 kg)   He is working in [employer] right now.  He is home on Fridays and would be available for an ultrasound on Friday   Patient Active Problem List   Diagnosis Date Noted  . Right inguinal hernia 04/27/2011    Past Medical History  Diagnosis Date  . Hyperlipidemia   . Hernia     Past Surgical History  Procedure Laterality Date  . Inguinal hernia repair  2012    right  . Groin dissection  11/19/2011    Procedure: GROIN EXPLORATION;  Surgeon: Harl Bowie, MD;  Location: [facility];  Service: General;  Laterality: Right;  Right Groin Exploration  . Inguinal hernia repair  11/19/2011    Procedure: HERNIA REPAIR INGUINAL ADULT;  Surgeon: Harl Bowie, MD;  Location: [facility];  Service: General;  Laterality: Right;  excision of right groin mass    History  Substance Use Topics  . Smoking status: Former Smoker -- 0.25 packs/day for 3 years   Types: Cigarettes  . Smokeless tobacco: Not on file  . Alcohol Use: 0.0 oz/week    5-6 Cans of beer per week     Comment: per week    History reviewed. No pertinent family history.  No Known Allergies  Medication list has been reviewed and updated.  No current outpatient prescriptions on file prior to visit.   No current facility-administered medications on file prior to visit.    Review of Systems:  As per HPI- otherwise negative.   Physical Examination: Filed Vitals:   05/31/14 1147  BP: 126/76  Pulse: 56  Temp: 99.2 F (37.3 C)  Resp: 16   Filed Vitals:   05/31/14 1147  Height: 5\' 8"  (1.727 m)  Weight: 229 lb (103.874 kg)   Body mass index is 34.83 kg/(m^2). Ideal Body Weight: Weight in (lb) to have BMI = 25: 164.1  GEN: WDWN, NAD, Non-toxic, A & O x 3, obese but looks well HEENT: Atraumatic, Normocephalic. Neck supple. No masses, No LAD.  Bilateral TM wnl, oropharynx normal.  PEERL,EOMI.   Ears and Nose: No external deformity. CV: RRR, No M/G/R. No JVD. No thrill. No extra heart sounds. PULM: CTA B, no wheezes, crackles, rhonchi. No retractions. No resp. distress. No accessory  muscle use. ABD: S, NT, ND, +BS. No rebound. No HSM.  Mild RUQ tenderness, negative murphy's sign EXTR: No c/c/e NEURO Normal gait.  PSYCH: Normally interactive. Conversant. Not depressed or anxious appearing.  Calm demeanor.  tenderness and spasm in left lower lumbar muscles Normal BLE strength, sensation and DTR  [facility] reading (PRIMARY) by  Dr. Lorelei Pont. Right ribs: negative CXR: negative  RIGHT RIBS AND CHEST - 3+ VIEW  COMPARISON: Concurrently obtained chest x-ray  FINDINGS: No fracture or other bone lesions are seen involving the ribs. There is no evidence of pneumothorax or pleural effusion. Both lungs are clear. Heart size and mediastinal contours are within normal limits.  IMPRESSION: Negative.  EXAM: CHEST - 1 VIEW  COMPARISON: Concurrently obtained radiographs  of the right ribs  FINDINGS: The frontal view of the chest is included with the rib series images.  The lungs are clear and negative for focal airspace consolidation, pulmonary edema or suspicious pulmonary nodule. No pleural effusion or pneumothorax. Cardiac and mediastinal contours are within normal limits. No acute fracture or lytic or blastic osseous lesions. The visualized upper abdominal bowel gas pattern is unremarkable.  IMPRESSION: No active cardiopulmonary disease.    Results for orders placed or performed in visit on 05/31/14  POCT CBC  Result Value Ref Range   WBC 8.5 4.6 - 10.2 K/uL   Lymph, poc 2.8 0.6 - 3.4   POC LYMPH PERCENT 32.6 10 - 50 %L   MID (cbc) 0.3 0 - 0.9   POC MID % 3.9 0 - 12 %M   POC Granulocyte 5.4 2 - 6.9   Granulocyte percent 63.5 37 - 80 %G   RBC 5.15 4.69 - 6.13 M/uL   Hemoglobin 16.1 14.1 - 18.1 g/dL   HCT, POC 46.9 43.5 - 53.7 %   MCV 91.2 80 - 97 fL   MCH, POC 31.3 (A) 27 - 31.2 pg   MCHC 34.4 31.8 - 35.4 g/dL   RDW, POC 12.7 %   Platelet Count, POC 217 142 - 424 K/uL   MPV 8.8 0 - 99.8 fL    Assessment and Plan: RUQ pain - Plan: POCT CBC, Comprehensive metabolic panel, DG Chest 1 View, DG Ribs Unilateral W/Chest Right, methocarbamol (ROBAXIN) 500 MG tablet  Immunization due  Gastroesophageal reflux disease, esophagitis presence not specified  Flu vaccine need - Plan: Flu Vaccine QUAD 36+ mos IM  Lumbar strain, initial encounter - Plan: US Abdomen Limited RUQ  [name] has noted RUQ pain for 3 weeks which is suspicious for gallstones.  VS and CBC are reassuring that he does not have cholecystitis.  Will need an ultrasound We were able to set up a RUQ [name] for him next week, 11/13 at 9:40 [place] [facility].  He is aware of appt and will seek care right away if any worsening in the meantime  Robaxin as needed for back pain  Signed Dominic Blinks, MD

## 2014-06-01 LAB — COMPREHENSIVE METABOLIC PANEL
ALBUMIN: 4.6 g/dL (ref 3.5–5.2)
ALT: 28 U/L (ref 0–53)
AST: 24 U/L (ref 0–37)
Alkaline Phosphatase: 69 U/L (ref 39–117)
BUN: 14 mg/dL (ref 6–23)
CALCIUM: 9.3 mg/dL (ref 8.4–10.5)
CHLORIDE: 105 meq/L (ref 96–112)
CO2: 23 meq/L (ref 19–32)
CREATININE: 0.65 mg/dL (ref 0.50–1.35)
Glucose, Bld: 84 mg/dL (ref 70–99)
POTASSIUM: 4.1 meq/L (ref 3.5–5.3)
SODIUM: 140 meq/L (ref 135–145)
TOTAL PROTEIN: 7.5 g/dL (ref 6.0–8.3)
Total Bilirubin: 1.7 mg/dL — ABNORMAL HIGH (ref 0.2–1.2)

## 2014-06-02 ENCOUNTER — Encounter: Payer: Self-pay | Admitting: Family Medicine

## 2014-06-07 ENCOUNTER — Telehealth: Payer: Self-pay

## 2014-06-07 ENCOUNTER — Ambulatory Visit
Admission: RE | Admit: 2014-06-07 | Discharge: 2014-06-07 | Disposition: A | Discharge status: Home / Self Care | Payer: BC Managed Care – PPO | Source: Ambulatory Visit | Attending: Family Medicine | Admitting: Family Medicine

## 2014-06-07 DIAGNOSIS — S39012A Strain of muscle, fascia and tendon of lower back, initial encounter: Secondary | ICD-10-CM

## 2014-06-07 DIAGNOSIS — R1011 Right upper quadrant pain: Secondary | ICD-10-CM

## 2014-06-07 NOTE — Telephone Encounter (Signed)
Called and LMOM- his [name] looked ok.  Will call him back tomorrow to go over details but there is nothing emergent

## 2014-06-07 NOTE — Telephone Encounter (Signed)
Patient went to [facility] today and was told they sent [name] his results.  He is very concerned and came immediately over here to see if he could get the results.  I told him [facility] was not here and he asked if any other doctor could look at the results.  Please call [phone]

## 2014-06-07 NOTE — Telephone Encounter (Signed)
Please advise results to report to the pt.

## 2014-06-08 NOTE — Telephone Encounter (Signed)
Called him again- no answer.  LMOM

## 2014-06-10 ENCOUNTER — Encounter: Payer: Self-pay | Admitting: Family Medicine

## 2014-06-10 MED ORDER — METHOCARBAMOL 500 MG PO TABS: 500.0000 mg | ORAL_TABLET | Freq: Three times a day (TID) | ORAL | Status: DC | PRN

## 2014-06-10 NOTE — Telephone Encounter (Signed)
Called him back.  His gallbladder looks ok but it does appear that he has fatty liver. Encouraged further weight loss; he will try to get to 210- 215. He will come and see me in about 2 months for a recheck.  Will follow-up on his labs then

## 2014-06-10 NOTE — Addendum Note (Signed)
Addended by: Lamar Blinks C on: 06/10/2014 05:53 PM   Modules accepted: Orders

## 2014-08-09 ENCOUNTER — Ambulatory Visit (INDEPENDENT_AMBULATORY_CARE_PROVIDER_SITE_OTHER): Payer: BLUE CROSS/BLUE SHIELD | Admitting: Family Medicine

## 2014-08-09 VITALS — BP 126/82 | HR 59 | Temp 98.2°F | Resp 18 | Ht 69.0 in | Wt 225.0 lb

## 2014-08-09 DIAGNOSIS — K219 Gastro-esophageal reflux disease without esophagitis: Secondary | ICD-10-CM

## 2014-08-09 DIAGNOSIS — M722 Plantar fascial fibromatosis: Secondary | ICD-10-CM

## 2014-08-09 DIAGNOSIS — J069 Acute upper respiratory infection, unspecified: Secondary | ICD-10-CM

## 2014-08-09 DIAGNOSIS — R05 Cough: Secondary | ICD-10-CM

## 2014-08-09 DIAGNOSIS — M545 Low back pain, unspecified: Secondary | ICD-10-CM

## 2014-08-09 DIAGNOSIS — R059 Cough, unspecified: Secondary | ICD-10-CM

## 2014-08-09 MED ORDER — METHOCARBAMOL 500 MG PO TABS
500.0000 mg | ORAL_TABLET | Freq: Three times a day (TID) | ORAL | Status: DC | PRN
Start: 2014-08-09 — End: 2014-10-05

## 2014-08-09 MED ORDER — BENZONATATE 100 MG PO CAPS: 100.0000 mg | ORAL_CAPSULE | Freq: Three times a day (TID) | ORAL | Status: DC | PRN

## 2014-08-09 MED ORDER — OMEPRAZOLE 20 MG PO CPDR: 20.0000 mg | DELAYED_RELEASE_CAPSULE | Freq: Every day | ORAL | Status: DC | PRN

## 2014-08-09 NOTE — Progress Notes (Addendum)
Subjective:  This chart was scribed for Merri Ray, MD, by Tamsen Roers, at [facility].  This patient was seen in room 13 and the patient's care was started at 8:55 AM.   Patient ID: Dominic [name], male    DOB: Jun 03, 1976, 39 y.o.   MRN: [id number]  HPI  HPI Comments: Dominic [name] is a 39 y.o. male who presents to [facility] complaining of a cough and foot pain.   Cough Patient states he has a dry cough which started three days ago and has been sick ever since. Patient notes his first symptom was a cough.  Patient states that those who are around him have been sick as well (at home and work).  Patient has not measured his temperature but feels flushed. He is not taking any medication for his cold and he denies a fever.    Foot pain: Foot pain has been present for two months and notes it is worse in the mornings when he first wakes up.  Patient has never had any injury to his foot no has he  had any changes in his work.  Notes he only messages his foot to alleviate the pain.    ---Dr. Silvestre Mesi  treats him for GERD and his back pain.  GERD: Takes an acid blocker every couple days and notes it helps with the heartburn.  He was last seen in January and notes he will see Dr. Edilia Bo again in about a month.  Patient notes he is now eating more vegetables and trying to watch his diet.   Back pain:  Patient takes a muscle relaxer once a day at bedtime  for back pain.  He notes he may have pulled a muscle at work (he works Teacher, music).  He has not tried going without medication. He was last seen in  November by Dr. Edilia Bo.  Denies bowel incontinence, numbness,radiating pain, abdominal pain, hematochezia, or changes in stool color.   Notes the pain is alleviates when he takes medication but notices it again when he drives long hours. Patient is currently not doing any exercises for the pain.     --[place] and Spanish spoken as patient  understands both.   At the end of the visit as providing after the visit summary, patient requested refills of his other medications.    Patient Active Problem List   Diagnosis Date Noted  . Obesity 05/31/2014  . Right inguinal hernia 04/27/2011   Past Medical History  Diagnosis Date  . Hyperlipidemia   . Hernia    Past Surgical History  Procedure Laterality Date  . Inguinal hernia repair  2012    right  . Groin dissection  11/19/2011    Procedure: GROIN EXPLORATION;  Surgeon: Harl Bowie, MD;  Location: [facility];  Service: General;  Laterality: Right;  Right Groin Exploration  . Inguinal hernia repair  11/19/2011    Procedure: HERNIA REPAIR INGUINAL ADULT;  Surgeon: Harl Bowie, MD;  Location: [facility];  Service: General;  Laterality: Right;  excision of right groin mass   No Known Allergies Prior to Admission medications   Medication Sig Start Date End Date Taking? Authorizing Provider  methocarbamol (ROBAXIN) 500 MG tablet Take 1 tablet (500 mg total) by mouth every 8 (eight) hours as needed for muscle spasms. 06/10/14  Yes Gay Filler Copland, MD  OMEPRAZOLE PO Take 20 mg by mouth daily.   Yes Historical Provider, MD   History  Social History  . Marital Status: Married    Spouse Name: N/A    Number of Children: N/A  . Years of Education: N/A   Occupational History  . Not on file.   Social History Main Topics  . Smoking status: Former Smoker -- 0.25 packs/day for 3 years    Types: Cigarettes  . Smokeless tobacco: Not on file  . Alcohol Use: 0.0 oz/week    5-6 Cans of beer per week     Comment: per week  . Drug Use: No  . Sexual Activity: Not on file   Other Topics Concern  . Not on file   Social History Narrative         Review of Systems  Constitutional: Negative for fever.  Respiratory: Positive for cough.   Musculoskeletal: Positive for myalgias.       Objective:   Physical Exam  Constitutional: He is oriented to person, place, and  time. He appears well-developed and well-nourished.  HENT:  Head: Normocephalic and atraumatic.  Right Ear: Tympanic membrane, external ear and ear canal normal.  Left Ear: Tympanic membrane, external ear and ear canal normal.  Nose: No rhinorrhea.  Mouth/Throat: Oropharynx is clear and moist and mucous membranes are normal. No oropharyngeal exudate or posterior oropharyngeal erythema.  Sinuses are non tender   Eyes: Conjunctivae are normal. Pupils are equal, round, and reactive to light.  Neck: Neck supple.  No lymphoadenopathy   Cardiovascular: Normal rate, regular rhythm, normal heart sounds and intact distal pulses.   No murmur heard. Pulmonary/Chest: Effort normal and breath sounds normal. He has no wheezes. He has no rhonchi. He has no rales.  Abdominal: Soft. There is no tenderness.  Musculoskeletal:  Right Foot:  Tender to palpation on the proximal plantar fascaia  No bony tenderness in foot or ankle.   Exhibits some mild pes planus  Has a mortons second toe.   Low back has full ROM Minimal tenderness on the left para spinals Approx. L4- L5     Lymphadenopathy:    He has no cervical adenopathy.  Neurological: He is alert and oriented to person, place, and time.  Reflexes are 2+ to patella and achilles   Skin: Skin is warm and dry. No rash noted.  Psychiatric: He has a normal mood and affect. His behavior is normal.  Vitals reviewed.   Filed Vitals:   08/09/14 0827  BP: 126/82  Pulse: 59  Temp: 98.2 F (36.8 C)  TempSrc: Oral  Resp: 18  Height: 5\' 9"  (1.753 m)  Weight: 225 lb (102.059 kg)  SpO2: 96%         Assessment & Plan:   Dominic [name] is a 39 y.o. male Cough - Plan: benzonatate (TESSALON) 100 MG capsule, Acute upper respiratory infection  -viral URI likely. Reassuring exam. Sx care wih fluids, rest, saline ns, mucinex or mucinex DM, or Tessalon if needed. rtc precautions given and if not improving into next week - consider abx then, but discussed  viral etiology most likely now.  Understanding expressed.   Plantar fasciitis of right foot  -stretches, arch insert, and ice massage as needed. rtc if persistent or worsening.   Gastroesophageal reflux disease, esophagitis presence not specified - Plan: omeprazole (PRILOSEC) 20 MG capsule  -trigger avoidance per handout, refilled omeprazole.   Left-sided low back pain without sciatica - Plan: methocarbamol (ROBAXIN) 500 MG tablet  -recurrent,  notes after long drive. HEP and back care manual given. Refilled Robaxin for now - intermittent  use if needed, and if persistent next month - RTC for recheck.   Meds ordered this encounter  Medications  . benzonatate (TESSALON) 100 MG capsule    Sig: Take 1 capsule (100 mg total) by mouth 3 (three) times daily as needed for cough.    Dispense:  20 capsule    Refill:  0  . omeprazole (PRILOSEC) 20 MG capsule    Sig: Take 1 capsule (20 mg total) by mouth daily as needed.    Dispense:  30 capsule    Refill:  0  . methocarbamol (ROBAXIN) 500 MG tablet    Sig: Take 1 tablet (500 mg total) by mouth every 8 (eight) hours as needed for muscle spasms.    Dispense:  30 tablet    Refill:  0   Patient Instructions  Saline nasal spray atleast 4 times per day, over the counter mucinex or mucinex DM for cough OR tessalon perles if needed. drink plenty of fluids. Return to the clinic or go to the nearest emergency room if any of your symptoms worsen or new symptoms occur.  Stretches as discussed and ice massage for foot pain.  If not improving in next 6 weeks - recheck.   Exercises for low back pain, muscle relaxant (Robaxin) if needed. Follow up in next month if not improved.   See foods to avoid for heartburn. Omeprazole if needed and follow up with Dr. Lorelei Pont as planned.   Return to the clinic or go to the nearest emergency room if any of your symptoms worsen or new symptoms occur.  Tos - Adultos  (Cough, Adult)  La tos es un reflejo que ayuda a  limpiar las vas areas y Patent examiner. Puede ayudar a curar el organismo o ser Ardelia Mems reaccin a un irritante. La tos puede durar [employer] 2  3 semanas (aguda) o puede durar ms de 8 semanas (crnica)  CAUSAS  Tos aguda:   Infecciones virales o bacterianas. Tos crnica.   Infecciones.  Alergias.  Asma.  Goteo post nasal.  El hbito de fumar.  Acidez o reflujo gstrico.  Algunos medicamentos.  Problemas pulmonares crnicos  Cncer. SNTOMAS   Tos.  Cristy Hilts.  Dolor en el pecho.  Aumento en el ritmo respiratorio.  Ruidos agudos al respirar (sibilancias).  Moco coloreado al toser (esputo). TRATAMIENTO   Un tos de causa bacteriana puede tratarse con antibiticos.  La tos de origen viral debe seguir su curso y no responde a los antibiticos.  El mdico podr recomendar otros tratamientos si tiene tos crnica. INSTRUCCIONES PARA EL CUIDADO EN EL HOGAR   Solo tome medicamentos que se pueden comprar sin receta o recetados para Conservation officer, historic buildings, Tree surgeon o fiebre, como le indica el mdico. Utilice antitusivos slo en la forma indicada por el mdico.  Use un vaporizador o humidificador de niebla fra en la habitacin para ayudar a aflojar las secreciones.  Duerma en posicin semi erguida si la tos empeora por la noche.  Descanse todo lo que pueda.  Si fuma, abandone el hbito. SOLICITE ATENCIN MDICA DE INMEDIATO SI:   Observa pus en el esputo.  La tos empeora.  No puede controlar la tos con antitusivos y no puede dormir debido a Presenter, broadcasting.  Comienza a escupir sangre al toser.  Tiene dificultad para respirar.  El dolor empeora o no puede controlarlo con los medicamentos.  Tiene fiebre. ASEGRESE DE QUE:   Comprende estas instrucciones.  Controlar su enfermedad.  Solicitar ayuda de inmediato si no mejora o si  empeora. Document Released: 02/17/2011 Document Revised: 10/04/2011 [facility] Patient Information 2015 [place]. This information is not intended to  replace advice given to you by your health care provider. Make sure you discuss any questions you have with your health care provider.  Infeccin de las vas areas superiores en los adultos (Upper Respiratory Infection, Adult)  La infeccin respiratoria de las vas areas superiores se conoce tambin como resfro comn. Las vas areas superiores [employer] senos nasales, la garganta, la trquea, y los bronquios. Los bronquios son las vas areas que conducen el aire a los pulmones. La mayor parte de las personas mejora luego de una [place], Armed forces training and education officer los sntomas pueden durar [employer] Disney. La tos residual puede durar ms. CAUSAS Varios tipos de virus pueden causar la infeccin de los tejidos que cubren las vas areas superiores. Los tejidos se irritan y se inflaman y se originan secreciones. Tambin es frecuente la produccin de moco. El resfro es contagioso. El virus se disemina fcilmente a otras personas por contacto oral. Aqu se incluyen los besos, el compartir un vaso y el toser o Brewing technologist. Tambin puede diseminarse tocndose la boca o la [place] y luego tocando una superficie que luego tocan Producer, television/film/video.  SNTOMAS Los sntomas se desarrollan entre uno y tres das luego de Dietitian en contacto con el virus. Pueden variar de Ardelia Mems persona a otra. Incluyen:  Secrecin nasal.  Estornudos  Congestin nasal.  Irritacin de los senos nasales.  Dolor de Investment banker, operational.  Prdida de la voz (laringitis).  Tos.  Fatiga.  Dolores musculares.  Prdida del apetito.  Dolor de [place].  Fiebre no muy elevada. DIAGNSTICO Puede diagnosticarse a s mismo la infeccin respiratoria, segn los sntomas habituales, ya que la mayor parte de las personas se resfra dos o tres veces al ao. El profesional puede confirmarlo basndose en el examen fsico. Lo ms importante es que el profesional verifique que los sntomas no se deben a otra enfermedad como anginas, sinusitis, neumona, asma o epiglotitis.  Para diagnosticar el resfrio comn, no es necesario que haga anlisis de [place], pruebas en la garganta o radiografas, pero en algunos casos puede ser de utilidad para excluir otros problemas ms graves. El mdico decidir si necesita otras pruebas. RIESGOS Y COMPLICACIONES Tendr mayor riesgo de sufrir un resfro grave si consume cigarrillos, sufre una enfermedad cardaca (como insuficiencia cardaca) o pulmonar crnica (como asma) o si tiene un debilitamiento del sistema inmunolgico. Las personas muy jvenes o muy mayores tienen riesgo de sufrir infecciones ms graves. La sinusitis bacteriana, las infecciones del odo medio y la neumona bacteriana pueden complicar el resfro comn. El resfro puede exacerbar el asma y la enfermedad pulmonar obstructiva crnica. En algunos casos estas complicaciones requieren la atencin en un servicio de emergencias y pueden poner en peligro la vida. PREVENCIN La mejor manera de protegerse para no contraer un resfro es Theatre manager una buena higiene. Evite el contacto bucal o de las manos con personas con sntomas de resfro. Si se produce el contacto, lvese las manos con frecuencia. No hay pruebas firmes que indiquen que la vitamina C, la vitamina E, la equincea o la actividad fsica reduzcan las posibilidades de tener una infeccin. Sin embargo, siempre se recomienda Scientific laboratory technician y Lucilla Edin buena nutricin. TRATAMIENTO El tratamiento est dirigido a Herbalist sntomas. Esta enfermedad no tiene [place]. Los antibiticos no son eficaces, ya que esta infeccin la causa un virus y no una bacteria. El tratamiento incluye:  Aumente la ingesta de lquidos. Consumo de  bebidas deportivas, que proporcionan electrolitos,azcares e hidratacin.  Inhale vapor caliente (de un vaporizador o de la ducha).  Tomar sopa de pollo u otros lquidos claros, y [employer] buena nutricin.  Descanse lo suficiente.  Haga grgaras o coma pastillas para [employer].  Control de la fiebre con ibuprofeno o acetaminofen, segn las indicaciones del mdico.  Aumento del uso del inhalador, si sufre asma. Las pastillas y los geles de zinc durante las primeras 24 horas de iniciado el resfro comn, pueden disminuir la duracin y Public house manager la gravedad de los sntomas. Los medicamentos para Conservation officer, historic buildings pueden disminuir la fiebre, Public house manager los dolores musculares y Conservation officer, historic buildings de Investment banker, operational. Se dispone de una gran variedad de medicamentos de venta libre para tratar la congestin y la secrecin nasal. El profesional podr recomendarle inhalantes para los otros sntomas. INSTRUCCIONES PARA EL CUIDADO DOMICILIARIO  Utilice los medicamentos de venta libre o de prescripcin para Conservation officer, historic buildings, el malestar o la [place], segn se lo indique el profesional que lo asiste.  Utilice un vaporizador caliente o inhale vapor, haciendo salir agua de la ducha para aumentar la humedad [place]. Esto mantendr las secreciones hmedas y Arts development officer ms fcil respirar.  Beba gran cantidad de lquido para mantener la orina de tono claro o color amarillo plido.  Descanse todo lo que pueda.  Regrese a su trabajo cuando la temperatura se haya normalizado, o cuando el profesional que lo asiste se lo indique. Quizs sea necesario que permanezca en su casa durante un tiempo ms prolongado para Industrial/product designer a Producer, television/film/video. Tambin puede utilizar un barbijo y ser cuidadoso con el lavado de manos para evitar la diseminacin del virus. SOLICITE ATENCIN MDICA SI:  Luego de los primeros das siente que empeora en vez de Blue.  Necesita que Software engineer brinde ms informacin relacionada con los medicamentos para [employer] sntomas.  Siente escalofros, le falta el aire o escupe moco de color marrn o rojo. Estos pueden ser sntomas de neumona.  Tiene una secrecin nasal de color amarillo o marrn, o siente dolor en el rostro, especialmente cuando se inclina hacia adelante. Estos  pueden ser sntomas de sinusitis.  Tiene fiebre, siente el cuello hinchado, tiene dolor al tragar u observa manchas blancas en el fondo de la garganta. Estos pueden ser sntomas de angina por estreptococo. Dana Allan ATENCIN MDICA DE INMEDIATO SI:  Jaclynn Guarneri.  Comienza a sentir Clinical cytogeneticist de cabeza intenso o persistente, dolor de odos, en el seno nasal o en el pecho.  Tiene tos y esta se prolonga demasiado, tose y escupe sangre, la mucosidad habitual se modifica (si tiene una enfermedad pulmonar crnica) o respira con dificultad.  Siente rigidez en el cuello o dolor de cabeza intenso. Document Released: 04/21/2005 Document Revised: 10/04/2011 [facility] Patient Information 2015 [place]. This information is not intended to replace advice given to you by your health care provider. Make sure you discuss any questions you have with your health care provider.  Fascitis plantar (sndrome del espoln en el taln) con rehabilitacin (Plantar Fasciitis, Heel Spur Syndrome, with Rehab) La fascia plantar es una estructura fibrosa, tipo ligamento, de tejido blando que abarca la parte inferior del pie. La fascitis plantar es una enfermedad que ocasiona dolor en el pie debido a la inflamacin del tejido. SNTOMAS  Dolor y sensibilidad en la planta del pie.  Dolor especialmente al ponerse de pie o caminar. CAUSAS La fascitis plantar est causada por irritacin y lesin en la fascia plantar debajo  del pie. Los mecanismos ms frecuentes de una lesin son:  Golpe directo en la planta del pie.  Dao a un pequeo nervio que [employer], Mudlogger en que la fascia principal se une al hueso del taln.  Estrs aplicado en la fascia plantar debido a espolones seos. EL RIESGO AUMENTA CON:   Actividades que estresan la fascia plantar (correr, saltar, pivotar o cortar).  Poca fuerza y flexibilidad.  Calzado mal ajustado.  Msculos de la pantorrilla tensos.  Pie plano.  No hacer un  precalentamiento adecuado.  Obesidad. PREVENCIN  Precalentamiento adecuado y elongacin antes de la [place].  Descanso y recuperacin entre actividades.  Mantener la forma fsica:  Kerry Hough, flexibilidad y resistencia muscular.  Capacidad cardiovascular.  Mantenga un peso corporal adecuado.  Evite el estrs en la fascia plantar.  Para deportistas con pie plano, utilizacin de plantillas anatmicas para los arcos. PRONSTICO Si se trata adecuadamente, generalmente es curable sin [place]. En ocasiones requiere someterse a [place]. POSIBLES COMPLICACIONES  La recurrencia frecuente de los sntomas puede dar como resultado un problema crnico.  Problemas en la cintura causados para compensar la lesin, como renguera.  Dolor o debilidad en el pie al adelantar el pie luego de la [place].  Inflamacin crnica, cicatrizacin y ruptura parcial o completa de la fascia, que se produce luego de repetidas inyecciones. TRATAMIENTO El tratamiento inicial incluye el uso de medicamentos y la aplicacin de hielo para reducir Conservation officer, historic buildings y la inflamacin. Los ejercicios de elongacin y fortalecimiento pueden ayudar a reducir Conservation officer, historic buildings con la actividad, en especial los dirigidos al tendn de [place]. Los ejercicios pueden Press photographer o con un terapeuta. El [employer] podr recomendar que utilice tacos o soportes para el arco para ayudar a Software engineer de la fascia plantar. En algunos casos se indica una inyeccin de corticoides para reducir la inflamacin. Si los sntomas persisten por ms de 6 meses de tratamiento no quirrgico (conservador), se Biochemist, clinical.  MEDICAMENTOS  Si necesita analgsicos, se recomiendan los antiinflamatorios no esteroides, como aspirina e ibuprofeno y otros calmantes menores, como acetaminofeno  No tome medicamentos para Conservation officer, historic buildings dentro de los 7 das previos a la [place].  Los analgsicos prescriptos se indicarn si el mdico lo considera  necesario. Utilcelos como se le indique y slo cuando lo necesite.  En algunos casos se indica una inyeccin de corticosteroides. Estas inyecciones deben reservarse para los casos graves, porque slo se pueden administrar una determinada cantidad de veces. CALOR Y FRO  El tratamiento con fro [employer] y reduce la inflamacin. El fro debe aplicarse durante 10 a 15 minutos cada 2  3 horas para reducir la inflamacin y Conservation officer, historic buildings e inmediatamente despus de cualquier actividad que agrava los sntomas. Utilice bolsas de hielo o masajee la zona con un trozo de hielo (masaje de hielo).  El calor puede usarse antes de Neurosurgeon y [place] fortalecimiento indicadas por el profesional, le fisioterapeuta o Industrial/product designer. Utilice una bolsa trmica o sumerja la lesin en agua caliente. SOLICITE ATENCIN MDICA DE INMEDIATO SI:  El tratamiento no lo beneficia, o el trastorno empeora.  Los medicamentos producen efectos secundarios. EJERCICIOS EJERCICIOS DE AMPLITUD DE MOVIMIENTOS Y ELONGACIN - Fascitis plantar (sndrome del espoln en el taln) Estos ejercicios le ayudarn en la recuperacin de la lesin. Los sntomas podrn aliviarse con o sin asistencia adicional de su mdico, fisioterapeuta o Administrator, sports. Al completar estos ejercicios, recuerde:   Restaurar la  flexibilidad del tejido ayuda a que las articulaciones recuperen el movimiento normal. Esto permite que el movimiento y la actividad sea ms saludables y [employer].  Para que sea efectiva, cada elongacin debe realizarse durante al menos 30 segundos.  La elongacin nunca debe ser dolorosa. Deber sentir slo un alargamiento o distensin suave del tejido que estira. AMPLITUD DE MOVIMIENTOS - Extensin de los dedos - flexin  Sintese con la pierna derecha / izquierdo cruzada sobre la rodilla opuesta.  [employer] dedos de los pies y empjelos hacia usted. Debe sentir un estiramiento suave en la zona inferior de los dedos y  del pie.  Mantenga esta posicin durante __________ segundos.  Luego, tome los dedos de los pies y empjelos [place]. Debe sentir un estiramiento suave en la zona superior de los dedos y del pie.  Mantenga esta posicin durante __________ segundos. Reptalo __________ veces. Realice este estiramiento __________ Vicenta Aly por da.  AMPLITUD DE MOVIMIENTOS - Dorsiflexin del tobillo - Rosealee Albee, asistida  Qutese los zapatos y sintese en una silla, preferiblemente en una superficie sin alfombra.  Coloque el pie derecha / izquierdo debajo de la rodilla. Extienda la pierna contraria para estar apoyado.  Con el taln hacia abajo, deslice el pie derecha / izquierdo hacia la silla hasta que sienta un estiramiento en el tobillo o pantorrilla. Si no lo siente, deslice la cadera hacia adelante [employer] borde de la silla, manteniendo el taln [place].  Mantenga esta posicin durante __________ segundos. Reptalo __________ veces. Realice este estiramiento __________ Vicenta Aly por da.  Imbery manos en la pared.  Extienda la pierna derecha / izquierdo y Quarry manager la rodilla levemente flexionada.  Apunte los dedos ligeramente hacia adentro con el pie de atrs.  Mantenga el taln derecha / izquierdo en el suelo y la rodilla recta, cambie el peso hacia la pared y no permita que la espalda se arquee.  Debe sentir un estiramiento en la pantorrila derecha / izquierdo. Mantenga esta posicicin durante __________ segundos. Reptalo __________ veces. Realice este estiramiento __________ Vicenta Aly por da. [place] manos en la pared.  Extienda la pierna derecha / izquierdo y Quarry manager la rodilla levemente flexionada.  Apunte los dedos ligeramente hacia adentro con el pie de atrs.  Mantenga el taln derecha / izquierdo en el suelo, doble la rodilla de atrs y cambie suavemente el peso sobre la pierna de atrs hasta que sienta un ligero  estiramiento en la pantorrilla de atrs.  Mantenga esta posicicin durante __________ segundos. Reptalo __________ veces. Realice este estiramiento __________ Vicenta Aly por da. ELONGACIN - Gastrocsoleus De pie Nota: Este ejercicio puede ser muy estresante para el pie y el tobillo. Realice los ejercicios slo en la forma indicada por el profesional que lo asiste.   Coloque la regin metatarsiana del pie derecha / izquierdo y Banker en el mismo escaln.  Si es necesario, sostngase de la pared o de la barandilla de una escalera para mantener el equilibrio.  Levante lentamente el [employer], y permita que el peso del cuerpo presione el taln sobre el borde del escaln.  Debe sentir un estiramiento en la pantorrilla derecha / izquierdo.  Mantenga esta posicicin durante __________ segundos.  Repita este ejercicio con la rodilla derecha / izquierdo levemente flexionada. Reptalo __________ veces. Realice este estiramiento __________ Vicenta Aly por da.  EJERCICIOS DE FORTALECIMIENTO - Fascitis plantar (sndrome del espoln en el taln) Estos ejercicios le ayudarn en la recuperacin  de la lesin. Los sntomas podrn desaparecer con o sin mayor intervencin del profesional, el fisioterapeuta o Industrial/product designer. Al completar estos ejercicios, recuerde:   Los msculos pueden ganar tanto la resistencia como la fortaleza que necesita para sus actividades diarias a travs de ejercicios controlados.  Realice los ejercicios como se lo indic el mdico, el fisioterapeuta o Industrial/product designer. Aumente la resistencia y las repeticiones segn se le haya indicado. [place] de toalla  Sintese en una silla en una superficie no alfombrada.  Coloque el pie en Rozann Lesches, y mantenga el taln en el suelo.  Coloque la toalla alrededor del taln pero envuelva slo los dedos del pie. Mantenga el taln contra el piso.  Aada ____________________ al extremo de la toalla si el mdico, fisioterapeuta o entrenador se lo  indican. Reptalo __________ veces. Realice este ejercicio __________ veces por da. FUERZA - Inversin del tobillo  Asegure un extremo de una banda de goma para ejercicios a un objeto fijo (mesa, columna). Ate el extremo opuesto a su pie, justo antes de los dedos.  Coloque los puos [employer] rodillas. Esto har que la fuerza se concentre en el tobillo.  Lentamente, tire del dedo gordo [place] y Johny Shears y asegrese de que la banda est posicionada de tal forma que puede resistir el movimiento completo.  Mantenga esta posicicin durante __________ segundos.  Haga que los msculos resistan la banda mientras tira lentamente el pie hacia atrs hasta la posicin inicial. Reptalo __________ veces. Realice este ejercicio __________ veces por da.  Document Released: 04/28/2006 Document Revised: 10/04/2011 [facility] Patient Information 2015 [place]. This information is not intended to replace advice given to you by your health care provider. Make sure you discuss any questions you have with your health care provider.  Opciones de alimentos para pacientes con reflujo gastroesofgico (Food Choices for Gastroesophageal Reflux Disease) Cuando se tiene reflujo gastroesofgico (ERGE), los alimentos que se ingieren y los hbitos de alimentacin son muy importantes. Elegir los alimentos adecuados puede ayudar a Public house manager las molestias ocasionadas por el [place]. [place]?  Elija las frutas, los vegetales, los cereales integrales, los productos lcteos, la carne de [place], de pescado y de ave con bajo contenido de grasas.  Limite las grasas, como los [place], los aderezos para [place], la [place], los frutos secos y Publishing copy.  Lleve un registro de las comidas para identificar los alimentos que ocasionan sntomas.  Evite los alimentos que le ocasionen reflujo. Pueden ser distintos para cada persona.  Haga comidas pequeas con frecuencia en lugar de tres comidas  [employer].  Coma lentamente, en un clima distendido.  Limite el consumo de alimentos fritos.  Cocine los alimentos utilizando mtodos que no sean la fritura.  Evite el consumo alcohol.  Evite beber grandes cantidades de lquidos con las comidas.  Evite agacharse o recostarse hasta despus de 2 o 3horas de haber comido. QU ALIMENTOS NO SE RECOMIENDAN? Los siguientes son algunos alimentos y bebidas que pueden empeorar los sntomas: Astronomer. Jugo de tomate. Salsa de tomate y espagueti. Ajes. [place] y [place]. Rbano picante. Frutas [place]aran[place], pomelos y limn (fruta y [place]). Carnes Carnes de [place], de pescado y de ave con gran contenido de grasas. Esto incluye los perros calientes, las [place], el [place], la salchicha, el salame y el tocino. Lcteos Leche entera y [place]. [employer]. Crema. [place]. [place]. Queso crema.  Bebidas Caf y t negro, con o sin cafena Bebidas gaseosas o  energizantes. Condimentos Salsa picante. Salsa barbacoa.  Dulces/postres Chocolate y cacao. [place]. Menta y mentol. Grasas y [employer] con alto contenido de grasas, incluidas las papas fritas. Otros [place]. Especias picantes, como la [employer], la pimienta blanca, la pimienta roja, la pimienta de cayena, el curry en [place], los clavos de [place], el jengibre y el [place] en polvo. Los artculos mencionados arriba pueden no ser [employer] de las bebidas y los alimentos que se Higher education careers adviser. Comunquese con el nutricionista para recibir ms informacin. Document Released: 04/21/2005 Document Revised: 07/17/2013 [facility] Patient Information 2015 [place]. This information is not intended to replace advice given to you by your health care provider. Make sure you discuss any questions you have with your health care provider.      I personally performed the services described in this documentation, which was scribed in my presence. The  recorded information has been reviewed and considered, and addended by me as needed.

## 2014-08-09 NOTE — Patient Instructions (Addendum)
Saline nasal spray atleast 4 times per day, over the counter mucinex or mucinex DM for cough OR tessalon perles if needed. drink plenty of fluids. Return to the clinic or go to the nearest emergency room if any of your symptoms worsen or new symptoms occur.  Stretches as discussed and ice massage for foot pain.  If not improving in next 6 weeks - recheck.   Exercises for low back pain, muscle relaxant (Robaxin) if needed. Follow up in next month if not improved.   See foods to avoid for heartburn. Omeprazole if needed and follow up with Dr. Lorelei Pont as planned.   Return to the clinic or go to the nearest emergency room if any of your symptoms worsen or new symptoms occur.  Tos - Adultos  (Cough, Adult)  La tos es un reflejo que ayuda a limpiar las vas areas y Patent examiner. Puede ayudar a curar el organismo o ser Ardelia Mems reaccin a un irritante. La tos puede durar [employer] 2  3 semanas (aguda) o puede durar ms de 8 semanas (crnica)  CAUSAS  Tos aguda:   Infecciones virales o bacterianas. Tos crnica.   Infecciones.  Alergias.  Asma.  Goteo post nasal.  El hbito de fumar.  Acidez o reflujo gstrico.  Algunos medicamentos.  Problemas pulmonares crnicos  Cncer. SNTOMAS   Tos.  Cristy Hilts.  Dolor en el pecho.  Aumento en el ritmo respiratorio.  Ruidos agudos al respirar (sibilancias).  Moco coloreado al toser (esputo). TRATAMIENTO   Un tos de causa bacteriana puede tratarse con antibiticos.  La tos de origen viral debe seguir su curso y no responde a los antibiticos.  El mdico podr recomendar otros tratamientos si tiene tos crnica. INSTRUCCIONES PARA EL CUIDADO EN EL HOGAR   Solo tome medicamentos que se pueden comprar sin receta o recetados para Conservation officer, historic buildings, Tree surgeon o fiebre, como le indica el mdico. Utilice antitusivos slo en la forma indicada por el mdico.  Use un vaporizador o humidificador de niebla fra en la habitacin para ayudar a aflojar las  secreciones.  Duerma en posicin semi erguida si la tos empeora por la noche.  Descanse todo lo que pueda.  Si fuma, abandone el hbito. SOLICITE ATENCIN MDICA DE INMEDIATO SI:   Observa pus en el esputo.  La tos empeora.  No puede controlar la tos con antitusivos y no puede dormir debido a Presenter, broadcasting.  Comienza a escupir sangre al toser.  Tiene dificultad para respirar.  El dolor empeora o no puede controlarlo con los medicamentos.  Tiene fiebre. ASEGRESE DE QUE:   Comprende estas instrucciones.  Controlar su enfermedad.  Solicitar ayuda de inmediato si no mejora o si empeora. Document Released: 02/17/2011 Document Revised: 10/04/2011 [facility] Patient Information 2015 [place]. This information is not intended to replace advice given to you by your health care provider. Make sure you discuss any questions you have with your health care provider.  Infeccin de las vas areas superiores en los adultos (Upper Respiratory Infection, Adult)  La infeccin respiratoria de las vas areas superiores se conoce tambin como resfro comn. Las vas areas superiores Verizon senos nasales, la garganta, la trquea, y los bronquios. Los bronquios son las vas areas que conducen el aire a los pulmones. La mayor parte de las personas mejora luego de una [place], Armed forces training and education officer los sntomas pueden durar Walt Disney. La tos residual puede durar ms. CAUSAS Varios tipos de virus pueden causar la infeccin de los tejidos que cubren las vas areas  superiores. Los tejidos se irritan y se inflaman y se originan secreciones. Tambin es frecuente la produccin de moco. El resfro es contagioso. El virus se disemina fcilmente a otras personas por contacto oral. Aqu se incluyen los besos, el compartir un vaso y el toser o Brewing technologist. Tambin puede diseminarse tocndose la boca o la [place] y luego tocando una superficie que luego tocan Producer, television/film/video.  SNTOMAS Los sntomas se desarrollan entre  uno y tres das luego de Dietitian en contacto con el virus. Pueden variar de Ardelia Mems persona a otra. Incluyen:  Secrecin nasal.  Estornudos  Congestin nasal.  Irritacin de los senos nasales.  Dolor de Investment banker, operational.  Prdida de la voz (laringitis).  Tos.  Fatiga.  Dolores musculares.  Prdida del apetito.  Dolor de [place].  Fiebre no muy elevada. DIAGNSTICO Puede diagnosticarse a s mismo la infeccin respiratoria, segn los sntomas habituales, ya que la mayor parte de las personas se resfra dos o tres veces al ao. El profesional puede confirmarlo basndose en el examen fsico. Lo ms importante es que el profesional verifique que los sntomas no se deben a otra enfermedad como anginas, sinusitis, neumona, asma o epiglotitis. Para diagnosticar el resfrio comn, no es necesario que haga anlisis de [place], pruebas en la garganta o radiografas, pero en algunos casos puede ser de utilidad para excluir otros problemas ms graves. El mdico decidir si necesita otras pruebas. RIESGOS Y COMPLICACIONES Tendr mayor riesgo de sufrir un resfro grave si consume cigarrillos, sufre una enfermedad cardaca (como insuficiencia cardaca) o pulmonar crnica (como asma) o si tiene un debilitamiento del sistema inmunolgico. Las personas muy jvenes o muy mayores tienen riesgo de sufrir infecciones ms graves. La sinusitis bacteriana, las infecciones del odo medio y la neumona bacteriana pueden complicar el resfro comn. El resfro puede exacerbar el asma y la enfermedad pulmonar obstructiva crnica. En algunos casos estas complicaciones requieren la atencin en un servicio de emergencias y pueden poner en peligro la vida. PREVENCIN La mejor manera de protegerse para no contraer un resfro es Theatre manager una buena higiene. Evite el contacto bucal o de las manos con personas con sntomas de resfro. Si se produce el contacto, lvese las manos con frecuencia. No hay pruebas firmes que indiquen que la  vitamina C, la vitamina E, la equincea o la actividad fsica reduzcan las posibilidades de tener una infeccin. Sin embargo, siempre se recomienda Scientific laboratory technician y Lucilla Edin buena nutricin. TRATAMIENTO El tratamiento est dirigido a Herbalist sntomas. Esta enfermedad no tiene [place]. Los antibiticos no son eficaces, ya que esta infeccin la causa un virus y no una bacteria. El tratamiento incluye:  Aumente la ingesta de lquidos. Consumo de bebidas deportivas, que proporcionan electrolitos,azcares e hidratacin.  Inhale vapor caliente (de un vaporizador o de la ducha).  Tomar sopa de pollo u otros lquidos claros, y Campbell Soup buena nutricin.  Descanse lo suficiente.  Haga grgaras o coma pastillas para [employer].  Control de la fiebre con ibuprofeno o acetaminofen, segn las indicaciones del mdico.  Aumento del uso del inhalador, si sufre asma. Las pastillas y los geles de zinc durante las primeras 24 horas de iniciado el resfro comn, pueden disminuir la duracin y Public house manager la gravedad de los sntomas. Los medicamentos para Conservation officer, historic buildings pueden disminuir la fiebre, Public house manager los dolores musculares y Conservation officer, historic buildings de Investment banker, operational. Se dispone de una gran variedad de medicamentos de venta libre para tratar la congestin y la secrecin nasal. El profesional podr recomendarle inhalantes para los  otros sntomas. INSTRUCCIONES PARA EL CUIDADO DOMICILIARIO  Utilice los medicamentos de venta libre o de prescripcin para Conservation officer, historic buildings, el malestar o la [employer]lon, segn se lo indique el profesional que lo asiste.  Utilice un vaporizador caliente o inhale vapor, haciendo salir agua de la ducha para aumentar la humedad [place]. Esto mantendr las secreciones hmedas y Arts development officer ms fcil respirar.  Beba gran cantidad de lquido para mantener la orina de tono claro o color amarillo plido.  Descanse todo lo que pueda.  Regrese a su trabajo cuando la temperatura se haya normalizado, o cuando el  profesional que lo asiste se lo indique. Quizs sea necesario que permanezca en su casa durante un tiempo ms prolongado para Industrial/product designer a Producer, television/film/video. Tambin puede utilizar un barbijo y ser cuidadoso con el lavado de manos para evitar la diseminacin del virus. SOLICITE ATENCIN MDICA SI:  Luego de los primeros das siente que empeora en vez de [place].  Necesita que Software engineer brinde ms informacin relacionada con los medicamentos para [employer] sntomas.  Siente escalofros, le falta el aire o escupe moco de color marrn o rojo. Estos pueden ser sntomas de neumona.  Tiene una secrecin nasal de color amarillo o marrn, o siente dolor en el rostro, especialmente cuando se inclina hacia adelante. Estos pueden ser sntomas de sinusitis.  Tiene fiebre, siente el cuello hinchado, tiene dolor al tragar u observa manchas blancas en el fondo de la garganta. Estos pueden ser sntomas de angina por estreptococo. Dana Allan ATENCIN MDICA DE INMEDIATO SI:  Jaclynn Guarneri.  Comienza a sentir Clinical cytogeneticist de cabeza intenso o persistente, dolor de odos, en el seno nasal o en el pecho.  Tiene tos y esta se prolonga demasiado, tose y escupe sangre, la mucosidad habitual se modifica (si tiene una enfermedad pulmonar crnica) o respira con dificultad.  Siente rigidez en el cuello o dolor de cabeza intenso. Document Released: 04/21/2005 Document Revised: 10/04/2011 [facility] Patient Information 2015 [place]. This information is not intended to replace advice given to you by your health care provider. Make sure you discuss any questions you have with your health care provider.  Fascitis plantar (sndrome del espoln en el taln) con rehabilitacin (Plantar Fasciitis, Heel Spur Syndrome, with Rehab) La fascia plantar es una estructura fibrosa, tipo ligamento, de tejido blando que abarca la parte inferior del pie. La fascitis plantar es una enfermedad que ocasiona dolor en el pie  debido a la inflamacin del tejido. SNTOMAS  Dolor y sensibilidad en la planta del pie.  Dolor especialmente al ponerse de pie o caminar. CAUSAS La fascitis plantar est causada por irritacin y lesin en la fascia plantar debajo del pie. Los mecanismos ms frecuentes de una lesin son:  Golpe directo en la planta del pie.  Dao a un pequeo nervio que [employer], Mudlogger en que la fascia principal se une al hueso del taln.  Estrs aplicado en la fascia plantar debido a espolones seos. EL RIESGO AUMENTA CON:   Actividades que estresan la fascia plantar (correr, saltar, pivotar o cortar).  Poca fuerza y flexibilidad.  Calzado mal ajustado.  Msculos de la pantorrilla tensos.  Pie plano.  No hacer un precalentamiento adecuado.  Obesidad. PREVENCIN  Precalentamiento adecuado y elongacin antes de la [place].  Descanso y recuperacin entre actividades.  Mantener la forma fsica:  Kerry Hough, flexibilidad y resistencia muscular.  Capacidad cardiovascular.  Mantenga un peso corporal adecuado.  Evite el estrs en la fascia plantar.  Para deportistas con pie plano, utilizacin de plantillas anatmicas para los arcos. PRONSTICO Si se trata adecuadamente, generalmente es curable sin [place]. En ocasiones requiere someterse a [place]. POSIBLES COMPLICACIONES  La recurrencia frecuente de los sntomas puede dar como resultado un problema crnico.  Problemas en la cintura causados para compensar la lesin, como renguera.  Dolor o debilidad en el pie al adelantar el pie luego de la [place].  Inflamacin crnica, cicatrizacin y ruptura parcial o completa de la fascia, que se produce luego de repetidas inyecciones. TRATAMIENTO El tratamiento inicial incluye el uso de medicamentos y la aplicacin de hielo para reducir Conservation officer, historic buildings y la inflamacin. Los ejercicios de elongacin y fortalecimiento pueden ayudar a reducir Conservation officer, historic buildings con la actividad, en especial los  dirigidos al tendn de [place]. Los ejercicios pueden Press photographer o con un terapeuta. El Viacom podr recomendar que utilice tacos o soportes para el arco para ayudar a Software engineer de la fascia plantar. En algunos casos se indica una inyeccin de corticoides para reducir la inflamacin. Si los sntomas persisten por ms de 6 meses de tratamiento no quirrgico (conservador), se Biochemist, clinical.  MEDICAMENTOS  Si necesita analgsicos, se recomiendan los antiinflamatorios no esteroides, como aspirina e ibuprofeno y otros calmantes menores, como acetaminofeno  No tome medicamentos para Conservation officer, historic buildings dentro de los 7 das previos a la [place].  Los analgsicos prescriptos se indicarn si el mdico lo considera necesario. Utilcelos como se le indique y slo cuando lo necesite.  En algunos casos se indica una inyeccin de corticosteroides. Estas inyecciones deben reservarse para los casos graves, porque slo se pueden administrar una determinada cantidad de veces. CALOR Y FRO  El tratamiento con fro [employer] y reduce la inflamacin. El fro debe aplicarse durante 10 a 15 minutos cada 2  3 horas para reducir la inflamacin y Conservation officer, historic buildings e inmediatamente despus de cualquier actividad que agrava los sntomas. Utilice bolsas de hielo o masajee la zona con un trozo de hielo (masaje de hielo).  El calor puede usarse antes de Neurosurgeon y Coleman fortalecimiento indicadas por el profesional, le fisioterapeuta o Industrial/product designer. Utilice una bolsa trmica o sumerja la lesin en agua caliente. SOLICITE ATENCIN MDICA DE INMEDIATO SI:  El tratamiento no lo beneficia, o el trastorno empeora.  Los medicamentos producen efectos secundarios. EJERCICIOS EJERCICIOS DE AMPLITUD DE MOVIMIENTOS Y ELONGACIN - Fascitis plantar (sndrome del espoln en el taln) Estos ejercicios le ayudarn en la recuperacin de la lesin. Los sntomas podrn aliviarse con o sin asistencia adicional de  su mdico, fisioterapeuta o Administrator, sports. Al completar estos ejercicios, recuerde:   Restaurar la flexibilidad del tejido ayuda a que las articulaciones recuperen el movimiento normal. Esto permite que el movimiento y la actividad sea ms saludables y menos dolorosos.  Para que sea efectiva, cada elongacin debe realizarse durante al menos 30 segundos.  La elongacin nunca debe ser dolorosa. Deber sentir slo un alargamiento o distensin suave del tejido que estira. AMPLITUD DE MOVIMIENTOS - Extensin de los dedos - flexin  Sintese con la pierna derecha / izquierdo cruzada sobre la rodilla opuesta.  [employer] Corporation dedos de los pies y empjelos hacia usted. Debe sentir un estiramiento suave en la zona inferior de los dedos y del pie.  Mantenga esta posicin durante __________ segundos.  Luego, tome los dedos de los pies y empjelos [place]. Debe sentir un estiramiento suave en la zona superior de los dedos y del  pie.  Mantenga esta posicin durante __________ segundos. Reptalo __________ veces. Realice este estiramiento __________ Vicenta Aly por da.  AMPLITUD DE MOVIMIENTOS - Dorsiflexin del tobillo - Rosealee Albee, asistida  Qutese los zapatos y sintese en una silla, preferiblemente en una superficie sin alfombra.  Coloque el pie derecha / izquierdo debajo de la rodilla. Extienda la pierna contraria para estar apoyado.  Con el taln hacia abajo, deslice el pie derecha / izquierdo hacia la silla hasta que sienta un estiramiento en el tobillo o pantorrilla. Si no lo siente, deslice la cadera hacia adelante Goldman Sachs borde de la silla, manteniendo el taln [place].  Mantenga esta posicin durante __________ segundos. Reptalo __________ veces. Realice este estiramiento __________ Vicenta Aly por da.  [place] manos en la pared.  Extienda la pierna derecha / izquierdo y Quarry manager la rodilla levemente flexionada.  Apunte los dedos ligeramente hacia adentro con el  pie de atrs.  Mantenga el taln derecha / izquierdo en el suelo y la rodilla recta, cambie el peso hacia la pared y no permita que la espalda se arquee.  Debe sentir un estiramiento en la pantorrila derecha / izquierdo. Mantenga esta posicicin durante __________ segundos. Reptalo __________ veces. Realice este estiramiento __________ Vicenta Aly por da. [place] manos en la pared.  Extienda la pierna derecha / izquierdo y Quarry manager la rodilla levemente flexionada.  Apunte los dedos ligeramente hacia adentro con el pie de atrs.  Mantenga el taln derecha / izquierdo en el suelo, doble la rodilla de atrs y cambie suavemente el peso sobre la pierna de atrs hasta que sienta un ligero estiramiento en la pantorrilla de atrs.  Mantenga esta posicicin durante __________ segundos. Reptalo __________ veces. Realice este estiramiento __________ Vicenta Aly por da. ELONGACIN - Gastrocsoleus De pie Nota: Este ejercicio puede ser muy estresante para el pie y el tobillo. Realice los ejercicios slo en la forma indicada por el profesional que lo asiste.   Coloque la regin metatarsiana del pie derecha / izquierdo y Banker en el mismo escaln.  Si es necesario, sostngase de la pared o de la barandilla de una escalera para mantener el equilibrio.  Levante lentamente el Google, y permita que el peso del cuerpo presione el taln sobre el borde del escaln.  Debe sentir un estiramiento en la pantorrilla derecha / izquierdo.  Mantenga esta posicicin durante __________ segundos.  Repita este ejercicio con la rodilla derecha / izquierdo levemente flexionada. Reptalo __________ veces. Realice este estiramiento __________ Vicenta Aly por da.  EJERCICIOS DE FORTALECIMIENTO - Fascitis plantar (sndrome del espoln en el taln) Estos ejercicios le ayudarn en la recuperacin de la lesin. Los sntomas podrn desaparecer con o sin mayor intervencin del profesional, el  fisioterapeuta o Industrial/product designer. Al completar estos ejercicios, recuerde:   Los msculos pueden ganar tanto la resistencia como la fortaleza que necesita para sus actividades diarias a travs de ejercicios controlados.  Realice los ejercicios como se lo indic el mdico, el fisioterapeuta o Industrial/product designer. Aumente la resistencia y las repeticiones segn se le haya indicado. [place] de toalla  Sintese en una silla en una superficie no alfombrada.  Coloque el pie en Rozann Lesches, y mantenga el taln en el suelo.  Coloque la toalla alrededor del taln pero envuelva slo los dedos del pie. Mantenga el taln contra el piso.  Aada ____________________ al extremo de la toalla si el mdico, fisioterapeuta o entrenador se lo indican. Reptalo __________  veces. Realice este ejercicio __________ veces por da. FUERZA - Inversin del tobillo  Asegure un extremo de una banda de goma para ejercicios a un objeto fijo (mesa, columna). Ate el extremo opuesto a su pie, justo antes de los dedos.  Coloque los puos Lehman Brothers rodillas. Esto har que la fuerza se concentre en el tobillo.  Lentamente, tire del dedo gordo [place] y Johny Shears y asegrese de que la banda est posicionada de tal forma que puede resistir el movimiento completo.  Mantenga esta posicicin durante __________ segundos.  Haga que los msculos resistan la banda mientras tira lentamente el pie hacia atrs hasta la posicin inicial. Reptalo __________ veces. Realice este ejercicio __________ veces por da.  Document Released: 04/28/2006 Document Revised: 10/04/2011 [facility] Patient Information 2015 [place]. This information is not intended to replace advice given to you by your health care provider. Make sure you discuss any questions you have with your health care provider.  Opciones de alimentos para pacientes con reflujo gastroesofgico (Food Choices for Gastroesophageal Reflux Disease) Cuando se tiene reflujo  gastroesofgico (ERGE), los alimentos que se ingieren y los hbitos de alimentacin son muy importantes. Elegir los alimentos adecuados puede ayudar a Public house manager las molestias ocasionadas por el [place]. [place]?  Elija las frutas, los vegetales, los cereales integrales, los productos lcteos, la carne de [place], de pescado y de ave con bajo contenido de grasas.  Limite las grasas, como los [place], los aderezos para [place], la [place], los frutos secos y Publishing copy.  Lleve un registro de las comidas para identificar los alimentos que ocasionan sntomas.  Evite los alimentos que le ocasionen reflujo. Pueden ser distintos para cada persona.  Haga comidas pequeas con frecuencia en lugar de tres comidas Kellogg.  Coma lentamente, en un clima distendido.  Limite el consumo de alimentos fritos.  Cocine los alimentos utilizando mtodos que no sean la fritura.  Evite el consumo alcohol.  Evite beber grandes cantidades de lquidos con las comidas.  Evite agacharse o recostarse hasta despus de 2 o 3horas de haber comido. QU ALIMENTOS NO SE RECOMIENDAN? Los siguientes son algunos alimentos y bebidas que pueden empeorar los sntomas: Astronomer. Jugo de tomate. Salsa de tomate y espagueti. Ajes. [place] y [place]. Rbano picante. Frutas [place], pomelos y limn (fruta y [place]). Carnes Carnes de [place], de pescado y de ave con gran contenido de grasas. Esto incluye los perros calientes, las [place], el [place], la salchicha, el salame y el tocino. Lcteos Leche entera y [place]. Rite Aid. Crema. Carl. Helados. Queso crema.  Bebidas Caf y t negro, con o sin cafena Bebidas gaseosas o energizantes. Condimentos Salsa picante. Salsa barbacoa.  Dulces/postres Chocolate y cacao. [place]. Menta y mentol. Grasas y Unisys Corporation con alto contenido de grasas, incluidas las papas fritas. Otros [place]. Especias picantes,  como la Solectron Corporation, la pimienta blanca, la pimienta roja, la pimienta de cayena, el curry en [place], los clavos de [place], el jengibre y el [place] en polvo. Los artculos mencionados arriba pueden no ser [employer] Foods Company de las bebidas y los alimentos que se Higher education careers adviser. Comunquese con el nutricionista para recibir ms informacin. Document Released: 04/21/2005 Document Revised: 07/17/2013 [facility] Patient Information 2015 [place]. This information is not intended to replace advice given to you by your health care provider. Make sure you discuss any questions you have with your health care provider.

## 2014-10-05 ENCOUNTER — Ambulatory Visit (INDEPENDENT_AMBULATORY_CARE_PROVIDER_SITE_OTHER): Payer: BLUE CROSS/BLUE SHIELD | Admitting: Family Medicine

## 2014-10-05 VITALS — BP 132/80 | HR 68 | Temp 98.0°F | Resp 16 | Ht 69.75 in | Wt 233.2 lb

## 2014-10-05 DIAGNOSIS — K219 Gastro-esophageal reflux disease without esophagitis: Secondary | ICD-10-CM | POA: Diagnosis not present

## 2014-10-05 DIAGNOSIS — M722 Plantar fascial fibromatosis: Secondary | ICD-10-CM

## 2014-10-05 DIAGNOSIS — J301 Allergic rhinitis due to pollen: Secondary | ICD-10-CM

## 2014-10-05 MED ORDER — METHYLPREDNISOLONE ACETATE 80 MG/ML IJ SUSP
80.0000 mg | Freq: Once | INTRAMUSCULAR | Status: AC
  Administered 2014-10-05: 80 mg via INTRAMUSCULAR

## 2014-10-05 MED ORDER — METHYLPREDNISOLONE (PAK) 4 MG PO TABS: ORAL_TABLET | ORAL | Status: DC

## 2014-10-05 MED ORDER — OMEPRAZOLE 20 MG PO CPDR
20.0000 mg | DELAYED_RELEASE_CAPSULE | Freq: Every day | ORAL | Status: DC | PRN
Start: 2014-10-05 — End: 2014-11-10

## 2014-10-05 NOTE — Progress Notes (Signed)
° °  Subjective:    Patient ID: Dominic [name], male    DOB: [birth year], 39 y.o.   MRN: [id number] This chart was scribed for Robyn Haber, MD by Zola Button, Medical Scribe. This patient was seen in Room 8 and the patient's care was started at 9:02 AM.   HPI HPI Comments: Dominic [name] is a 39 y.o. male with a hx of GERD who presents to the [facility] complaining of environmental/season allergies. Patient states he gets seasonal allergies every year. He has been having discharge from his eyes and nose. He states he is not wheezing much. Patient has had pills and shots for his allergies in the past which have provided relief.   Patient also reports having right knee pain as well as right foot pain on the bottom of his foot.   He requests a refill for his GERD medication.  Patient has worked in Teacher, music for 12 years.  Review of Systems  HENT: Positive for rhinorrhea.   Eyes: Positive for discharge.  Musculoskeletal: Positive for arthralgias.  Allergic/Immunologic: Positive for environmental allergies.       Objective:   Physical Exam CONSTITUTIONAL: Well developed/well nourished HEAD: Normocephalic/atraumatic EYES: EOM/PERRL ENMT: Mucous membranes moist NECK: supple no meningeal signs SPINE: entire spine nontender CV: S1/S2 noted, no murmurs/rubs/gallops noted LUNGS: Lungs are clear to auscultation bilaterally, no apparent distress ABDOMEN: soft, nontender, no rebound or guarding GU: no cva tenderness NEURO: Pt is awake/alert, moves all extremitiesx4 EXTREMITIES: pulses normal, full ROM SKIN: warm, color normal PSYCH: no abnormalities of mood noted  Mucoid discharge from nasal passages which are swollen and normal color  Tender right plantar fascia with normal ROM    Assessment & Plan:   This chart was scribed in my presence and reviewed by me personally.    ICD-9-CM ICD-10-CM   1. Allergic rhinitis due to pollen 477.0 J30.1 methylPREDNIsolone  (MEDROL DOSPACK) 4 MG tablet     methylPREDNISolone acetate (DEPO-MEDROL) injection 80 mg  2. Gastroesophageal reflux disease, esophagitis presence not specified 530.81 K21.9 omeprazole (PRILOSEC) 20 MG capsule  3. Plantar fasciitis of right foot 728.71 M72.2 methylPREDNIsolone (MEDROL DOSPACK) 4 MG tablet     methylPREDNISolone acetate (DEPO-MEDROL) injection 80 mg     Signed, Robyn Haber, MD

## 2014-10-05 NOTE — Patient Instructions (Signed)
Reflujo gastroesofgico - Adultos  (Gastroesophageal Reflux Disease, Adult)  El reflujo gastroesofgico ocurre cuando el cido del estmago pasa al esfago. Cuando el cido entra en contacto con el esfago, el cido provoca dolor (inflamacin) en el esfago. Con el tiempo, pueden formarse pequeos agujeros (lceras) en el revestimiento del esfago. CAUSAS   Exceso de Engineer, site. Esto aplica presin [employer], lo que hace que el cido del estmago suba hacia el esfago.  El hbito de fumar [place] la produccin de cido en el [place].  El consumo de alcohol. Provoca disminucin de la presin en el esfnter esofgico inferior (vlvula o anillo de msculo entre el esfago y Product manager), permitiendo que el cido del estmago suba hacia el esfago.  Cenas a ltima hora del da y estmago lleno. Aumenta la presin y la produccin de cido en el estmago.  Malformacin en el esfnter esofgico inferior. A menudo no se halla causa.  SNTOMAS   Ardor y Aeronautical engineer parte inferior del pecho detrs del esternn y en la zona media del [place]. Puede ocurrir [employer]GM [employer] por semana o ms a menudo.  Dificultad para tragar.  Dolor de Investment banker, operational.  Tos seca.  Sntomas similares al asma que incluyen sensacin de opresin en el pecho, falta de aire y sibilancias. DIAGNSTICO  El mdico diagnosticar el problema basndose en los sntomas. En algunos casos, se indican radiografas y otras pruebas para verificar si hay complicaciones o para comprobar el estado del estmago y Education administrator.  TRATAMIENTO  El mdico le indicar medicamentos de venta libre o recetados para ayudar a disminuir la produccin de cido. Consulte con su mdico antes de Art gallery manager o agregar cualquier medicamento nuevo.  INSTRUCCIONES PARA EL CUIDADO EN EL HOGAR   Modifique los factores que pueda cambiar. Consulte con su mdico para solicitar orientacin relacionada con la prdida de peso, dejar de fumar y el consumo de  alcohol.  Evite las comidas y bebidas que empeoran los [place], [place]:  [place] con cafena o alcohlicas.  Chocolate.  Sabores a English as a second language teacher.  Ajo y cebolla.  Comidas muy condimentadas.  Ctricos como naranjas, limones o limas.  Alimentos que contengan tomate, como salsas, [place] y pizza.  Alimentos fritos y Radio broadcast assistant.  Evite acostarse durante 3 horas antes de irse a dormir o antes de tomar una siesta.  Haga comidas pequeas durante Psychiatrist de 3 comidas abundantes.  Use ropas sueltas. No use nada apretado alrededor de la cintura que cause presin en el estmago.  Levante (eleve) la cabecera de la cama 6 a 8 pulgadas (15 a 20 cm) con bloques de madera. Usar almohadas extra no ayuda.  Solo tome medicamentos que se pueden comprar sin receta o recetados para el dolor, Tree surgeon o fiebre, como le indica el mdico.  No tome aspirina, ibuprofeno ni antiinflamatorios no esteroides. [place] [employer] SI:   [employer], el cuello, la [place], los dientes o la espalda.  El dolor aumenta o cambia la intensidad o la durancin.  Tiene nuseas, vmitos o sudoracin(diaforesis).  Siente falta de aire o dolor en el pecho, o se desmaya.  Vomita y el vmito tiene [place], es de color [place], [place], negro o es similar a la borra del caf o tiene [place].  Las heces son rojas, sanguinolentas o negras. Estos sntomas pueden ser signos de otros problemas, como enfermedades cardacas, hemorragias gstrias o sangrado esofgico.  ASEGRESE DE QUE:   Comprende estas instrucciones.  Controlar su enfermedad.  Solicitar ayuda de inmediato si no mejora o si empeora. Document Released: 04/21/2005 Document Revised: 10/04/2011 [facility] Patient Information 2015 [place]. This information is not intended to replace advice given to you by your health care provider. Make sure you discuss any questions you have with your health care provider. Rinitis  alrgica (Allergic Rhinitis) La rinitis alrgica ocurre cuando las membranas mucosas de la nariz responden a los alrgenos. Los alrgenos son las partculas que estn en el aire y que hacen que el cuerpo tenga una reaccin IT consultant. Esto hace que usted libere anticuerpos alrgicos. A travs de una cadena de eventos, estos finalmente hacen que usted libere histamina en la corriente sangunea. Aunque la funcin de la histamina es proteger al organismo, es esta liberacin de histamina lo que provoca malestar, como los estornudos frecuentes, la congestin y goteo y Engineer, petroleum.  CAUSAS  La causa de la rinitis Regulatory affairs officer (fiebre del heno) son los alrgenos del polen que pueden provenir del csped, los rboles y Human resources officer. La causa de la rinitis Stage manager (rinitis alrgica perenne) son los alrgenos como los caros del polvo domstico, la caspa de las mascotas y las esporas del moho.  SNTOMAS   Secrecin nasal (congestin).  Goteo y picazn nasales con estornudos y Industrial/product designer. DIAGNSTICO  Su mdico puede ayudarlo a Actor alrgeno o los alrgenos que desencadenan sus sntomas. Si usted y su mdico no pueden Teacher, adult education cul es el alrgeno, pueden hacerse anlisis de sangre o estudios de la piel. TRATAMIENTO  La rinitis alrgica no tiene [place], pero puede controlarse mediante lo siguiente:  Medicamentos y vacunas contra la alergia (inmunoterapia).  Prevencin del alrgeno. La fiebre del heno a menudo puede tratarse con antihistamnicos en las formas de pldoras o aerosol nasal. Los antihistamnicos bloquean los efectos de la histamina. Existen medicamentos de venta libre que pueden ayudar con la congestin nasal y la hinchazn alrededor de los ojos. Consulte a su mdico antes de tomar o administrarse este medicamento.  Si la prevencin del alrgeno o el medicamento recetado no dan resultado, existen muchos medicamentos nuevos que su mdico puede recetarle. Pueden usarse  medicamentos ms fuertes si las medidas iniciales no son efectivas. Pueden aplicarse inyecciones desensibilizantes si los medicamentos y la prevencin no funcionan. La desensibilizacin ocurre cuando un paciente recibe vacunas constantes hasta que el cuerpo se vuelve menos sensible al alrgeno. Asegrese de Chartered certified accountant seguimiento con su mdico si los problemas continan. INSTRUCCIONES PARA EL CUIDADO EN EL HOGAR No es posible evitar por completo los alrgenos, pero puede reducir los sntomas al tomar medidas para limitar su exposicin a ellos. Es muy til saber exactamente a qu es alrgico para que pueda evitar sus desencadenantes especficos. SOLICITE ATENCIN MDICA SI:   Jaclynn Guarneri.  Desarrolla una tos que no se detiene fcilmente (persistente).  Le falta el aire.  Comienza a tener sibilancias.  Los sntomas interfieren con las actividades diarias normales. Document Released: 04/21/2005 Document Revised: 05/02/2013 [facility] Patient Information 2015 [place]. This information is not intended to replace advice given to you by your health care provider. Make sure you discuss any questions you have with your health care provider. Fascitis plantar (Plantar Fascitis) La fascitis plantar es un trastorno frecuente que ocasiona dolor en el pie. Se trata de una inflamacin (irritacin) de la banda de tejidos fibrosos y resistentes que se extienden desde el hueso del taln (calcneo) hasta la parte anterior de la planta del pie. Esta inflamacin puede deberse a que ha permanecido [employer] Corporation  de pie, a zapatos que no calzan bien, a correr [employer], al sobrepeso, a un andar anormal y el uso excesivo del pie con dolor (esto es frecuente en los corredores). Tambin es frecuente [employer] que practican ejercicios aerbicos y [place] bailarines de ballet. SNTOMAS La persona que sufre fascitis plantar manifiesta:  Dolor intenso por la Lockheed Martin parte inferior del pie,  especialmente al dar los primeros pasos luego de levantarse de la cama. Este dolor disminuye luego de caminar algunos minutos.  Dolor intenso al caminar V[employer] de un tiempo prolongado de inactividad.  El dolor empeora al caminar descalzo o al subir escaleras. DIAGNSTICO  El mdico har el diagnstico examinando sus pies.  En general no es necesario indicar radiografas. PREVENCIN  Consulte a Insurance claims handler en medicina del deporte antes de comenzar un nuevo programa de ejercicios.  Los programas de caminatas ofrecen un buen entrenamiento. Hay una menor probabilidad de sufrir lesiones por uso excesivo, que son frecuentes Le[employer] Brothers personas que corren. Hay menos impacto y menos agresin a las articulaciones.  Comience lentamente todo nuevo programa de ejercicios. Si aparece algn problema o dolor, disminuya la cantidad de tiempo o la distancia hasta que se encuentre cmodo.  Use calzado de buena calidad y reemplcelo regularmente.  Estire el pie y los ligamentos que se encuentran en la parte posterior del tobillo (tendn de [place]) antes y despus de Optometrist actividad fsica.  Corra o practique ejercicios sobre superficies parejas que no sean duras. Por ejemplo, el asfalto es mejor que el pavimento.  No corra descalzo sobre superficies duras.  Si camina sobre cinta, vare la inclinacin.  No siga con el entrenamiento si tiene problemas en el pie o en la articulacin. Busque ayuda profesional si no mejora. INSTRUCCIONES PARA EL CUIDADO DOMICILIARIO  Evite las [employer] causan dolor hasta que se recupere.  Use hielo o compresas fras sobre las zonas doloridas despus de Optometrist ejercicios.  Only take over-the-counter or prescription medicines for pain, discomfort, or fever as directed by your caregiver.  [place] zapatillas con base de aire o gel pueden ser de [place].  Si los problemas continan o se agravan, consulte a Corporate investment banker en medicina del deporte o con su mdico personal. La cortisona es un potente antiinflamatorio que puede inyectarse en la zona dolorida. El profesional que lo asiste comentar este tratamiento con usted. EST SEGURO QUE:   Comprende las instrucciones para el alta mdica.  Controlar su enfermedad.  Solicitar atencin mdica de inmediato segn las indicaciones. Document Released: 04/21/2005 Document Revised: 10/04/2011 [facility] Patient Information 2015 [place]. This information is not intended to replace advice given to you by your health care provider. Make sure you discuss any questions you have with your health care provider.

## 2014-11-10 ENCOUNTER — Ambulatory Visit (INDEPENDENT_AMBULATORY_CARE_PROVIDER_SITE_OTHER): Payer: BLUE CROSS/BLUE SHIELD | Admitting: Family Medicine

## 2014-11-10 VITALS — BP 144/80 | HR 91 | Temp 98.1°F | Resp 16 | Ht 68.0 in | Wt 236.5 lb

## 2014-11-10 DIAGNOSIS — J029 Acute pharyngitis, unspecified: Secondary | ICD-10-CM | POA: Diagnosis not present

## 2014-11-10 DIAGNOSIS — K219 Gastro-esophageal reflux disease without esophagitis: Secondary | ICD-10-CM | POA: Diagnosis not present

## 2014-11-10 DIAGNOSIS — E669 Obesity, unspecified: Secondary | ICD-10-CM

## 2014-11-10 DIAGNOSIS — M722 Plantar fascial fibromatosis: Secondary | ICD-10-CM | POA: Diagnosis not present

## 2014-11-10 DIAGNOSIS — J301 Allergic rhinitis due to pollen: Secondary | ICD-10-CM | POA: Diagnosis not present

## 2014-11-10 LAB — POCT RAPID STREP A (OFFICE): RAPID STREP A SCREEN: NEGATIVE

## 2014-11-10 MED ORDER — METHYLPREDNISOLONE ACETATE 80 MG/ML IJ SUSP
80.0000 mg | Freq: Once | INTRAMUSCULAR | Status: AC
  Administered 2014-11-10: 80 mg via INTRAMUSCULAR

## 2014-11-10 MED ORDER — LORATADINE 10 MG PO TABS: 10.0000 mg | ORAL_TABLET | Freq: Every day | ORAL | Status: DC

## 2014-11-10 MED ORDER — FLUTICASONE PROPIONATE 50 MCG/ACT NA SUSP: 2.0000 | Freq: Every day | NASAL | Status: DC

## 2014-11-10 MED ORDER — OMEPRAZOLE 20 MG PO CPDR: 20.0000 mg | DELAYED_RELEASE_CAPSULE | Freq: Every day | ORAL | Status: DC | PRN

## 2014-11-10 NOTE — Progress Notes (Addendum)
Subjective:    Patient ID: Dominic [name], male    DOB: [birth year], 39 y.o.   MRN: [id number]  11/10/2014  Allergies  This chart was scribed for Wardell Honour, MD by Stephania Fragmin, ED Scribe. This patient was seen in room 11 and the patient's care was started at 2:44 PM.   HPI   Chief Complaint  Patient presents with  . Allergies    Itchy eyes, scratchy throat, runny nose    HPI Comments: Dominic [name] is a 39 y.o. male who presents to the [facility] complaining of constant seasonal allergy symptoms. He endorses a scratchy sore throat, mild rhinorrhea, nasal congestion, and mild itchy, watery eyes--which have all gradually returned since he was seen by Dr. Joseph Art on 10/05/14 and given a DepoMedrol injection that significantly improved his symptoms. Patient normally has allergies around this month every year. 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 He denies a history of asthma. He doesn't remember if he has taken oral or nasal allergy medication in the past. He denies fever, chills, headache, ear pain, cough, nausea, vomiting, or constipation.    Last labs 04/06/2014; HgbA1c 5.2 at that time.    GERD: His GERD has improved significantly after taking medication (Prilosec, per medical records). Requesting refill.   Plantar Fasciitis: Patient also complains of constant pain in his right foot/heel. He states the Depomedrol injection that he received on 10/05/14 significantly improved his pain. He denies any home treatment, including icing his foot, for his plantar fasciitis.   Surgical History: Patient has a history of inguinal hernia repair x 2.  Family History: His mother is 25, and she has hypertension. His father is 26, and has hypertension. His siblings have no health problems.    Social History: Patient is from [place] but has lived here 25  years. He has 4 children; his old is 42. He denies a history of smoking.   Review of Systems  Constitutional: Negative for fever, chills, diaphoresis and fatigue.  HENT: Positive for congestion, postnasal drip, rhinorrhea, sneezing and sore throat. Negative for ear pain, trouble swallowing and voice change.   Eyes: Positive for discharge and itching.  Respiratory: Negative for cough and shortness of breath.   Cardiovascular: Negative for chest pain, palpitations and leg swelling.  Gastrointestinal: Negative for nausea, vomiting, abdominal pain, diarrhea, constipation, blood in stool, abdominal distention, anal bleeding and rectal pain.  Musculoskeletal: Positive for myalgias (right foot pain), arthralgias and gait problem. Negative for joint swelling.  Neurological: Negative for dizziness, light-headedness and headaches.    Past Medical History  Diagnosis Date  . Hyperlipidemia   . Hernia   . Allergy   . GERD (gastroesophageal reflux disease)    Past Surgical History  Procedure Laterality Date  . Inguinal hernia repair  2012    right  . Groin dissection  11/19/2011    Procedure: GROIN EXPLORATION;  Surgeon: Harl Bowie, MD;  Location: [facility];  Service: General;  Laterality: Right;  Right Groin Exploration  . Inguinal hernia repair  11/19/2011    Procedure: HERNIA REPAIR INGUINAL ADULT;  Surgeon: Harl Bowie, MD;  Location: [facility];  Service: General;  Laterality: Right;  excision of right groin mass   No Known Allergies History   Social History  . Marital Status: Married    Spouse Name: N/A  .  Number of Children: N/A  . Years of Education: N/A   Occupational History  .      Demolition work   Social History Main Topics  . Smoking status: Former Smoker -- 0.25 packs/day for 3 years    Types: Cigarettes  . Smokeless tobacco: Never Used  . Alcohol Use: 0.0 oz/week    5-6 Cans of beer per week     Comment: per week  . Drug Use: No  . Sexual Activity: Not on  file   Other Topics Concern  . Not on file   Social History Narrative   Marital status: ; from [place]; [place] 25 years ago.      Children:  4 children; no grandchildren      Tobacco: none   Family History  Problem Relation Age of Onset  . Hypertension Mother   . Diabetes Father         Objective:    BP 144/80 mmHg  Pulse 91  Temp(Src) 98.1 F (36.7 C) (Oral)  Resp 16  Ht 5\' 8"  (1.727 m)  Wt 236 lb 8 oz (107.276 kg)  BMI 35.97 kg/m2  SpO2 94% Physical Exam  Constitutional: He is oriented to person, place, and time. He appears well-developed and well-nourished. No distress.  obese  HENT:  Head: Normocephalic and atraumatic.  Right Ear: Tympanic membrane, external ear and ear canal normal.  Left Ear: Tympanic membrane, external ear and ear canal normal.  Nose: Nose normal.  Mouth/Throat: Uvula is midline and mucous membranes are normal. Posterior oropharyngeal erythema and tonsillar abscesses present. No oropharyngeal exudate or posterior oropharyngeal edema.  Eyes: Conjunctivae and EOM are normal.  Neck: Normal range of motion. Neck supple. No tracheal deviation present. No thyromegaly present.  Neck is normal.  Cardiovascular: Normal rate, regular rhythm and normal heart sounds.  Exam reveals no gallop and no friction rub.   No murmur heard. Pulmonary/Chest: Effort normal and breath sounds normal. No stridor. No respiratory distress. He has no wheezes. He has no rales.  Abdominal: Soft. Bowel sounds are normal. He exhibits no distension and no mass. There is no tenderness. There is no rebound and no guarding.  Musculoskeletal: Normal range of motion.       Right foot: There is tenderness and bony tenderness. There is normal range of motion, no swelling and normal capillary refill.  +R heel TTP.  No swelling.   Lymphadenopathy:    He has no cervical adenopathy.  Neurological: He is alert and oriented to person, place, and time.  Skin: Skin is warm and dry. No rash noted.  No erythema.  Psychiatric: He has a normal mood and affect. His behavior is normal.  Nursing note and vitals reviewed.  Results for orders placed or performed in visit on 11/10/14  POCT rapid strep A  Result Value Ref Range   Rapid Strep A Screen Negative Negative       Assessment & Plan:   1. Sore throat   2. Allergic rhinitis due to pollen   3. Gastroesophageal reflux disease without esophagitis   4. Plantar fasciitis, right   5. Obesity (BMI 30-39.9)   6. Gastroesophageal reflux disease, esophagitis presence not specified     1. Sore throat :New. Secondary to PND from allergic rhinitis.  Obtain throat culture.  Treat allergic rhinitis and sore throat will resolve. 2. Allergic Rhinitis: uncontrolled; s/p Depomedrol 80mg  IM; rx for Claritin 10mg  daily and Flonase daily. 3.  GERD: controlled; refill of Omeprazole 20mg  daily provided.  Recommend dietary modification and weight loss. 4.  Plantar fasciitis R: Persistent/recurrent; recommend icing qhs; home exercise program also provided; recommend heel cups; recommend supportive shoe.  S/p Depomedrol.  If no improvement one month, call for referral to podiatry. 5.  Obesity: recommend weight loss.    Meds ordered this encounter  Medications  . methylPREDNISolone acetate (DEPO-MEDROL) injection 80 mg    Sig:   . omeprazole (PRILOSEC) 20 MG capsule    Sig: Take 1 capsule (20 mg total) by mouth daily as needed.    Dispense:  30 capsule    Refill:  2  . loratadine (CLARITIN) 10 MG tablet    Sig: Take 1 tablet (10 mg total) by mouth daily.    Dispense:  30 tablet    Refill:  11  . fluticasone (FLONASE) 50 MCG/ACT nasal spray    Sig: Place 2 sprays into both nostrils daily.    Dispense:  16 g    Refill:  6    Return if symptoms worsen or fail to improve.  I personally performed the services described in this documentation, which was scribed in my presence. The recorded information has been reviewed and considered.  Greco Gastelum  Elayne Guerin, M.D. [facility] [address] [place], [place]  [address] [phone] phone [phone] fax

## 2014-11-10 NOTE — Patient Instructions (Signed)
Plantar Fasciitis (Heel Spur Syndrome) with Rehab The plantar fascia is a fibrous, ligament-like, soft-tissue structure that spans the bottom of the foot. Plantar fasciitis is a condition that causes pain in the foot due to inflammation of the tissue. SYMPTOMS   Pain and tenderness on the underneath side of the foot.  Pain that worsens with standing or walking. CAUSES  Plantar fasciitis is caused by irritation and injury to the plantar fascia on the underneath side of the foot. Common mechanisms of injury include:  Direct trauma to bottom of the foot.  Damage to a small nerve that runs under the foot where the main fascia attaches to the heel bone.  Stress placed on the plantar fascia due to bone spurs. RISK INCREASES WITH:   Activities that place stress on the plantar fascia (running, jumping, pivoting, or cutting).  Poor strength and flexibility.  Improperly fitted shoes.  Tight calf muscles.  Flat feet.  Failure to warm-up properly before activity.  Obesity. PREVENTION  Warm up and stretch properly before activity.  Allow for adequate recovery between workouts.  Maintain physical fitness:  Strength, flexibility, and endurance.  Cardiovascular fitness.  Maintain a health body weight.  Avoid stress on the plantar fascia.  Wear properly fitted shoes, including arch supports for individuals who have flat feet. PROGNOSIS  If treated properly, then the symptoms of plantar fasciitis usually resolve without surgery. However, occasionally surgery is necessary. RELATED COMPLICATIONS   Recurrent symptoms that may result in a chronic condition.  Problems of the lower back that are caused by compensating for the injury, such as limping.  Pain or weakness of the foot during push-off following surgery.  Chronic inflammation, scarring, and partial or complete fascia tear, occurring more often from repeated injections. TREATMENT  Treatment initially involves the use of  ice and medication to help reduce pain and inflammation. The use of strengthening and stretching exercises may help reduce pain with activity, especially stretches of the Achilles tendon. These exercises may be performed at home or with a therapist. Your caregiver may recommend that you use heel cups of arch supports to help reduce stress on the plantar fascia. Occasionally, corticosteroid injections are given to reduce inflammation. If symptoms persist for greater than 6 months despite non-surgical (conservative), then surgery may be recommended.  MEDICATION   If pain medication is necessary, then nonsteroidal anti-inflammatory medications, such as aspirin and ibuprofen, or other minor pain relievers, such as acetaminophen, are often recommended.  Do not take pain medication within 7 days before surgery.  Prescription pain relievers may be given if deemed necessary by your caregiver. Use only as directed and only as much as you need.  Corticosteroid injections may be given by your caregiver. These injections should be reserved for the most serious cases, because they may only be given a certain number of times. HEAT AND COLD  Cold treatment (icing) relieves pain and reduces inflammation. Cold treatment should be applied for 10 to 15 minutes every 2 to 3 hours for inflammation and pain and immediately after any activity that aggravates your symptoms. Use ice packs or massage the area with a piece of ice (ice massage).  Heat treatment may be used prior to performing the stretching and strengthening activities prescribed by your caregiver, physical therapist, or athletic trainer. Use a heat pack or soak the injury in warm water. SEEK IMMEDIATE MEDICAL CARE IF:  Treatment seems to offer no benefit, or the condition worsens.  Any medications produce adverse side effects. EXERCISES RANGE   OF MOTION (ROM) AND STRETCHING EXERCISES - Plantar Fasciitis (Heel Spur Syndrome) These exercises may help you  when beginning to rehabilitate your injury. Your symptoms may resolve with or without further involvement from your physician, physical therapist or athletic trainer. While completing these exercises, remember:   Restoring tissue flexibility helps normal motion to return to the joints. This allows healthier, less painful movement and activity.  An effective stretch should be held for at least 30 seconds.  A stretch should never be painful. You should only feel a gentle lengthening or release in the stretched tissue. RANGE OF MOTION - Toe Extension, Flexion  Sit with your right / left leg crossed over your opposite knee.  Grasp your toes and gently pull them back toward the top of your foot. You should feel a stretch on the bottom of your toes and/or foot.  Hold this stretch for __________ seconds.  Now, gently pull your toes toward the bottom of your foot. You should feel a stretch on the top of your toes and or foot.  Hold this stretch for __________ seconds. Repeat __________ times. Complete this stretch __________ times per day.  RANGE OF MOTION - Ankle Dorsiflexion, Active Assisted  Remove shoes and sit on a chair that is preferably not on a carpeted surface.  Place right / left foot under knee. Extend your opposite leg for support.  Keeping your heel down, slide your right / left foot back toward the chair until you feel a stretch at your ankle or calf. If you do not feel a stretch, slide your bottom forward to the edge of the chair, while still keeping your heel down.  Hold this stretch for __________ seconds. Repeat __________ times. Complete this stretch __________ times per day.  STRETCH - Gastroc, Standing  Place hands on wall.  Extend right / left leg, keeping the front knee somewhat bent.  Slightly point your toes inward on your back foot.  Keeping your right / left heel on the floor and your knee straight, shift your weight toward the wall, not allowing your back to  arch.  You should feel a gentle stretch in the right / left calf. Hold this position for __________ seconds. Repeat __________ times. Complete this stretch __________ times per day. STRETCH - Soleus, Standing  Place hands on wall.  Extend right / left leg, keeping the other knee somewhat bent.  Slightly point your toes inward on your back foot.  Keep your right / left heel on the floor, bend your back knee, and slightly shift your weight over the back leg so that you feel a gentle stretch deep in your back calf.  Hold this position for __________ seconds. Repeat __________ times. Complete this stretch __________ times per day. STRETCH - Gastrocsoleus, Standing  Note: This exercise can place a lot of stress on your foot and ankle. Please complete this exercise only if specifically instructed by your caregiver.   Place the ball of your right / left foot on a step, keeping your other foot firmly on the same step.  Hold on to the wall or a rail for balance.  Slowly lift your other foot, allowing your body weight to press your heel down over the edge of the step.  You should feel a stretch in your right / left calf.  Hold this position for __________ seconds.  Repeat this exercise with a slight bend in your right / left knee. Repeat __________ times. Complete this stretch __________ times per day.    STRENGTHENING EXERCISES - Plantar Fasciitis (Heel Spur Syndrome)  These exercises may help you when beginning to rehabilitate your injury. They may resolve your symptoms with or without further involvement from your physician, physical therapist or athletic trainer. While completing these exercises, remember:   Muscles can gain both the endurance and the strength needed for everyday activities through controlled exercises.  Complete these exercises as instructed by your physician, physical therapist or athletic trainer. Progress the resistance and repetitions only as guided. STRENGTH -  Towel Curls  Sit in a chair positioned on a non-carpeted surface.  Place your foot on a towel, keeping your heel on the floor.  Pull the towel toward your heel by only curling your toes. Keep your heel on the floor.  If instructed by your physician, physical therapist or athletic trainer, add ____________________ at the end of the towel. Repeat __________ times. Complete this exercise __________ times per day. STRENGTH - Ankle Inversion  Secure one end of a rubber exercise band/tubing to a fixed object (table, pole). Loop the other end around your foot just before your toes.  Place your fists between your knees. This will focus your strengthening at your ankle.  Slowly, pull your big toe up and in, making sure the band/tubing is positioned to resist the entire motion.  Hold this position for __________ seconds.  Have your muscles resist the band/tubing as it slowly pulls your foot back to the starting position. Repeat __________ times. Complete this exercises __________ times per day.  Document Released: 07/12/2005 Document Revised: 10/04/2011 Document Reviewed: 10/24/2008 Exit[employer] Patient Information 2015 [employer]. This information is not intended to replace advice given to you by your health care provider. Make sure you discuss any questions you have with your health care provider.  

## 2014-11-12 LAB — CULTURE, GROUP A STREP: ORGANISM ID, BACTERIA: NORMAL

## 2015-03-15 ENCOUNTER — Other Ambulatory Visit: Payer: Self-pay

## 2015-03-15 DIAGNOSIS — K219 Gastro-esophageal reflux disease without esophagitis: Secondary | ICD-10-CM

## 2015-03-15 NOTE — Telephone Encounter (Signed)
Pt would like a refill on his omeprazole (PRILOSEC) 20 MG capsule [[id number]] script. Please advise at [phone]

## 2015-03-17 MED ORDER — OMEPRAZOLE 20 MG PO CPDR: 20.0000 mg | DELAYED_RELEASE_CAPSULE | Freq: Every day | ORAL | Status: DC | PRN

## 2015-03-17 NOTE — Telephone Encounter (Signed)
Sent in RFs and notified pt on VM of RFs, and need for f/up before the 2 mos are up.

## 2015-06-06 ENCOUNTER — Ambulatory Visit (INDEPENDENT_AMBULATORY_CARE_PROVIDER_SITE_OTHER): Payer: BLUE CROSS/BLUE SHIELD | Admitting: Physician Assistant

## 2015-06-06 VITALS — BP 122/84 | HR 63 | Temp 98.3°F | Resp 18 | Ht 68.0 in | Wt 241.6 lb

## 2015-06-06 DIAGNOSIS — K219 Gastro-esophageal reflux disease without esophagitis: Secondary | ICD-10-CM | POA: Diagnosis not present

## 2015-06-06 DIAGNOSIS — R5383 Other fatigue: Secondary | ICD-10-CM | POA: Diagnosis not present

## 2015-06-06 DIAGNOSIS — Z23 Encounter for immunization: Secondary | ICD-10-CM

## 2015-06-06 DIAGNOSIS — R0683 Snoring: Secondary | ICD-10-CM | POA: Diagnosis not present

## 2015-06-06 DIAGNOSIS — Z0000 Encounter for general adult medical examination without abnormal findings: Secondary | ICD-10-CM | POA: Diagnosis not present

## 2015-06-06 DIAGNOSIS — J301 Allergic rhinitis due to pollen: Secondary | ICD-10-CM

## 2015-06-06 DIAGNOSIS — E669 Obesity, unspecified: Secondary | ICD-10-CM | POA: Diagnosis not present

## 2015-06-06 DIAGNOSIS — Z833 Family history of diabetes mellitus: Secondary | ICD-10-CM

## 2015-06-06 DIAGNOSIS — M722 Plantar fascial fibromatosis: Secondary | ICD-10-CM

## 2015-06-06 DIAGNOSIS — E781 Pure hyperglyceridemia: Secondary | ICD-10-CM | POA: Diagnosis not present

## 2015-06-06 LAB — POCT URINALYSIS DIP (MANUAL ENTRY)
Bilirubin, UA: NEGATIVE
Blood, UA: NEGATIVE
GLUCOSE UA: NEGATIVE
Ketones, POC UA: NEGATIVE
Leukocytes, UA: NEGATIVE
NITRITE UA: NEGATIVE
Protein Ur, POC: NEGATIVE
SPEC GRAV UA: 1.02
Urobilinogen, UA: 0.2
pH, UA: 5.5

## 2015-06-06 LAB — COMPREHENSIVE METABOLIC PANEL
ALK PHOS: 69 U/L (ref 40–115)
ALT: 64 U/L — ABNORMAL HIGH (ref 9–46)
AST: 38 U/L (ref 10–40)
Albumin: 4.3 g/dL (ref 3.6–5.1)
BILIRUBIN TOTAL: 1.8 mg/dL — AB (ref 0.2–1.2)
BUN: 14 mg/dL (ref 7–25)
CALCIUM: 9.3 mg/dL (ref 8.6–10.3)
CHLORIDE: 104 mmol/L (ref 98–110)
CO2: 22 mmol/L (ref 20–31)
Creat: 0.65 mg/dL (ref 0.60–1.35)
GLUCOSE: 98 mg/dL (ref 65–99)
POTASSIUM: 4 mmol/L (ref 3.5–5.3)
Sodium: 141 mmol/L (ref 135–146)
Total Protein: 7.5 g/dL (ref 6.1–8.1)

## 2015-06-06 LAB — LIPID PANEL
CHOLESTEROL: 160 mg/dL (ref 125–200)
HDL: 36 mg/dL — AB (ref >=40)
LDL Cholesterol: 90 mg/dL (ref <130)
TRIGLYCERIDES: 172 mg/dL — AB (ref <150)
Total CHOL/HDL Ratio: 4.4 Ratio (ref <=5.0)
VLDL: 34 mg/dL — ABNORMAL HIGH (ref <30)

## 2015-06-06 LAB — CBC
HEMATOCRIT: 47.5 % (ref 39.0–52.0)
Hemoglobin: 16.5 g/dL (ref 13.0–17.0)
MCH: 31.5 pg (ref 26.0–34.0)
MCHC: 34.7 g/dL (ref 30.0–36.0)
MCV: 90.6 fL (ref 78.0–100.0)
MPV: 11.1 fL (ref 8.6–12.4)
Platelets: 223 10*3/uL (ref 150–400)
RBC: 5.24 MIL/uL (ref 4.22–5.81)
RDW: 13.3 % (ref 11.5–15.5)
WBC: 6.5 10*3/uL (ref 4.0–10.5)

## 2015-06-06 LAB — HEMOGLOBIN A1C
Hgb A1c MFr Bld: 5.8 % — ABNORMAL HIGH (ref <5.7)
Mean Plasma Glucose: 120 mg/dL — ABNORMAL HIGH (ref <117)

## 2015-06-06 LAB — TSH: TSH: 1.53 u[IU]/mL (ref 0.350–4.500)

## 2015-06-06 MED ORDER — OMEPRAZOLE 20 MG PO CPDR: 20.0000 mg | DELAYED_RELEASE_CAPSULE | Freq: Every day | ORAL | Status: DC | PRN

## 2015-06-06 MED ORDER — FLUTICASONE PROPIONATE 50 MCG/ACT NA SUSP: 2.0000 | Freq: Every day | NASAL | Status: DC

## 2015-06-06 NOTE — Patient Instructions (Signed)
You will get a phone call to schedule appointment with podiatry. You will get a phone call to schedule appointment for sleep study. I will call you with your lab results within next 1 week. Make sure you do heel exercises daily and before getting out of bed. Only walk with supportive shoes, do not walk bare footed. Apply ice to your heel daily. Return with problems.

## 2015-06-06 NOTE — Progress Notes (Signed)
[facility] [address], [place] [address] [phone]  Date:  06/06/2015   Name:  Dominic [name]   DOB:  1976/07/19   MRN:  [id number]  PCP:  PROVIDER NOT IN SYSTEM    Chief Complaint: Annual Exam and Flu Vaccine   History of Present Illness:  This is a 39 y.o. male with PMH allergic rhinitis, GERD, obesity who is presenting for CPE.  Right plantar fasciitis for the past 14 months. He notices it first thing when he wakes in the morning but then gets better as he moves. 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 He wears supportive shoes. He had a solumedrol injection for allergies 6 months ago that helped his foot pain as well, but nothing else has helped much.  Pt had a right inguinal hernia repair in 2012 and 2013. States he will feel a slight twinge in that area with prolonged sitting and if he lifts something heavy, which he tries not to do. No pain with prolonged standing. No change in bowel habits. No testicular pain or swelling.  Pt snores at night and states wife has witnessed apneic events. He is fatigued during the day. He has never had a sleep study.  About 11 months ago he was working hard on diet and exercise and had gotten down to 125 pounds. He states stress caused him to stop exercising and go back to old eating habits. He is now 141 pounds. Over the past 2 weeks he has started going back to the gym. He goes 3 times a week and does treadmill and recumbent bike. He is trying to eat less carbs at dinner and instead eat more fruits and vegetables.  GERD: takes omeprazole prn and works well  Allergic rhinitis: env allergies bother him in the spring. Takes claritin and flonase during allergy season.  Immunizations: needs flu shot today, tdap 4 years ago Dentist: yearly Eye: Wears glasses for distance and driving Fam hx: Mother and Father with DM. No history CAD, CVA, cancers. Sexual hx: He is married and sexually  active. He does not want STD testing today. Urinary hesitancy/frequency or nocturia: no Problems with erectile dysfunction: no Tobacco/alcohol/substance use: no/6 beers per week/no Works as Librarian, academic in Teacher, music. Sleeps well.  Sometimes feels irritable at work but overall mood is good. Had prostate exam 1 year ago and normal. No hx prostate cancer. No obstruction sx.  Review of Systems:  Review of Systems  Constitutional: Positive for fatigue. Negative for fever, chills, diaphoresis, activity change, appetite change and unexpected weight change.  HENT: Negative.   Eyes: Negative.   Respiratory: Negative.   Cardiovascular: Negative.   Gastrointestinal: Negative.   Endocrine: Negative.   Genitourinary: Negative.   Musculoskeletal: Positive for arthralgias. Negative for myalgias, back pain, joint swelling, gait problem, neck pain and neck stiffness.  Skin: Negative.   Allergic/Immunologic: Positive for environmental allergies. Negative for food allergies and immunocompromised state.  Neurological: Negative.   Hematological: Negative.   Psychiatric/Behavioral: Negative.     Patient Active Problem List   Diagnosis Date Noted  . Obesity 05/31/2014  . Right inguinal hernia 04/27/2011    Prior to Admission medications   Medication Sig Start Date End Date Taking? Authorizing Provider  fluticasone (FLONASE) 50 MCG/ACT nasal spray Place 2 sprays into both nostrils daily. Patient not taking: Reported on 06/06/2015 11/10/14  prn Wardell Honour, MD  loratadine (CLARITIN) 10 MG tablet Take 1  tablet (10 mg total) by mouth daily. Patient not taking: Reported on 06/06/2015 11/10/14  prn Wardell Honour, MD  omeprazole (PRILOSEC) 20 MG capsule Take 1 capsule (20 mg total) by mouth daily as needed. Patient not taking: Reported on 06/06/2015 03/17/15  prn Wardell Honour, MD    No Known Allergies  Past Surgical History  Procedure Laterality Date  . Inguinal hernia repair  2012    right  .  Groin dissection  11/19/2011    Procedure: GROIN EXPLORATION;  Surgeon: Harl Bowie, MD;  Location: [facility];  Service: General;  Laterality: Right;  Right Groin Exploration  . Inguinal hernia repair  11/19/2011    Procedure: HERNIA REPAIR INGUINAL ADULT;  Surgeon: Harl Bowie, MD;  Location: [facility];  Service: General;  Laterality: Right;  excision of right groin mass    Social History  Substance Use Topics  . Smoking status: Former Smoker -- 0.25 packs/day for 3 years    Types: Cigarettes  . Smokeless tobacco: Never Used  . Alcohol Use: 0.0 oz/week    5-6 Cans of beer per week     Comment: per week    Family History  Problem Relation Age of Onset  . Hypertension Mother   . Diabetes Mother   . Diabetes Father     Medication list has been reviewed and updated.  Physical Examination:  Physical Exam  Constitutional: He is oriented to person, place, and time. He appears well-developed and well-nourished.  HENT:  Head: Normocephalic and atraumatic.  Right Ear: Hearing, tympanic membrane, external ear and ear canal normal.  Left Ear: Hearing, tympanic membrane, external ear and ear canal normal.  Nose: Nose normal.  Mouth/Throat: Uvula is midline, oropharynx is clear and moist and mucous membranes are normal.  Mallampati stage 3  Eyes: Conjunctivae, EOM and lids are normal. Pupils are equal, round, and reactive to light. Right eye exhibits no discharge. Left eye exhibits no discharge. No scleral icterus.  Neck: Trachea normal and normal range of motion. Neck supple. Carotid bruit is not present. No thyromegaly present.  Cardiovascular: Normal rate, regular rhythm, normal heart sounds, intact distal pulses and normal pulses.   No murmur heard. Pulmonary/Chest: Effort normal and breath sounds normal. No respiratory distress. He has no wheezes. He has no rhonchi. He has no rales.  Abdominal: Soft. Normal appearance and bowel sounds are normal. He exhibits no abdominal bruit.  There is no tenderness.  No right hernia felt with lying, standing or valsava  Musculoskeletal: Normal range of motion. He exhibits no edema.       Right foot: There is tenderness (where plantar fascia inserts on calcaneus). There is normal range of motion and no bony tenderness.       Left foot: Normal.  No pain with heel squeeze No pain over achilles tendon  Lymphadenopathy:       Head (right side): No submental, no submandibular, no tonsillar, no preauricular, no posterior auricular and no occipital adenopathy present.       Head (left side): No submental, no submandibular, no tonsillar, no preauricular, no posterior auricular and no occipital adenopathy present.    He has no cervical adenopathy.       Right: No supraclavicular adenopathy present.       Left: No supraclavicular adenopathy present.  Neurological: He is alert and oriented to person, place, and time. He has normal strength and normal reflexes. No cranial nerve deficit or sensory deficit. Coordination and gait normal.  Skin: Skin is warm, dry and intact. No lesion and no rash noted.  Psychiatric: He has a normal mood and affect. His speech is normal and behavior is normal. Judgment and thought content normal.   BP 122/84 mmHg  Pulse 63  Temp(Src) 98.3 F (36.8 C) (Oral)  Resp 18  Ht 5\' 8"  (1.727 m)  Wt 241 lb 9.6 oz (109.589 kg)  BMI 36.74 kg/m2  SpO2 97%   Visual Acuity Screening   Right eye Left eye Both eyes  Without correction: 20/40 -2 20/40 -2 20/30 -2  With correction:      Assessment and Plan:  1. Annual physical exam Up to date on preventative medicine We discussed prostate cancer screening and the current guidelines to recommend against screening. Will screen again at age 69 or sooner if develops obstruction sx. Labs below pending. Discussed importance of exercise and weight loss. Congratulated on starting back at the gym. - POCT urinalysis dipstick  2. Plantar fasciitis, right We discussed  stretching, ice, supportive shoes. He did not want a plantar fasciitis injection today. Referred to podiatry for further evaluation. - Ambulatory referral to Podiatry  3. Obesity (BMI 30-39.9) Discussed weight loss and exercise, per above.  4. Family history of diabetes mellitus type II - Comprehensive metabolic panel - Hemoglobin A1c  5. Hypertriglyceridemia - Lipid panel  6. Other fatigue 7. Snoring Will refer for sleep study d/t witnessed apneic episodes. Also discussed weight loss. - CBC - TSH - Ambulatory referral to Sleep Studies  8. Needs flu shot - Flu Vaccine QUAD 36+ mos IM  9. Seasonal allergic rhinitis due to pollen - fluticasone (FLONASE) 50 MCG/ACT nasal spray; Place 2 sprays into both nostrils daily.  Dispense: 16 g; Refill: 11  10. Gastroesophageal reflux disease, esophagitis presence not specified - omeprazole (PRILOSEC) 20 MG capsule; Take 1 capsule (20 mg total) by mouth daily as needed.  Dispense: 30 capsule; Refill: Franklin Drenda Freeze, MHS [facility]  06/06/2015

## 2015-06-25 ENCOUNTER — Telehealth: Payer: Self-pay

## 2015-06-25 NOTE — Telephone Encounter (Signed)
Pt. Called in about his labs results. So it showed that a letter was sent to him but he has been out of town, so I just read off what Elmyra Ricks message said about his labs.

## 2015-06-29 ENCOUNTER — Ambulatory Visit (INDEPENDENT_AMBULATORY_CARE_PROVIDER_SITE_OTHER): Payer: BLUE CROSS/BLUE SHIELD | Admitting: Physician Assistant

## 2015-06-29 VITALS — BP 132/82 | HR 80 | Temp 98.7°F | Resp 18 | Ht 69.0 in | Wt 247.8 lb

## 2015-06-29 DIAGNOSIS — J302 Other seasonal allergic rhinitis: Secondary | ICD-10-CM | POA: Diagnosis not present

## 2015-06-29 MED ORDER — PSEUDOEPHEDRINE HCL 60 MG PO TABS: 60.0000 mg | ORAL_TABLET | ORAL | Status: DC | PRN

## 2015-06-29 MED ORDER — CETIRIZINE HCL 10 MG PO TABS: 10.0000 mg | ORAL_TABLET | Freq: Every day | ORAL | Status: DC

## 2015-06-29 MED ORDER — FLUTICASONE PROPIONATE 50 MCG/ACT NA SUSP: 2.0000 | Freq: Every day | NASAL | Status: DC

## 2015-06-29 NOTE — Progress Notes (Signed)
06/29/2015 8:44 AM   DOB: 03-20-1976 / MRN: [id number]  SUBJECTIVE:  Dominic [name] is a 38 y.o. male presenting for for the evaluation of congestion, sneezing, eye itching and watering that started 2 weeks ago.  Associated symptoms include none today and he denies fever, cough and headache. Treatments tried thus far include Allegra and Flonase, although he has not had these recently due to working out of town.He denies sick contacts.  He has No Known Allergies.   He  has a past medical history of Hyperlipidemia; Hernia; Allergy; and GERD (gastroesophageal reflux disease).    He  reports that he has quit smoking. His smoking use included Cigarettes. He has a .75 pack-year smoking history. He has never used smokeless tobacco. He reports that he drinks alcohol. He reports that he does not use illicit drugs. He  has no sexual activity history on file. The patient  has past surgical history that includes Inguinal hernia repair (2012); Groin dissection (11/19/2011); and Inguinal hernia repair (11/19/2011).  His family history includes Diabetes in his father and mother; Hypertension in his mother.  Review of Systems  Constitutional: Negative for fever, chills, malaise/fatigue and diaphoresis.  HENT: Positive for congestion. Negative for sore throat.   Respiratory: Negative for cough, hemoptysis, shortness of breath and wheezing.   Cardiovascular: Negative for chest pain.  Gastrointestinal: Negative for nausea.  Skin: Negative for rash.  Neurological: Negative for dizziness and weakness.  Endo/Heme/Allergies: Negative for polydipsia.    Problem list and medications reviewed and updated by myself where necessary, and exist elsewhere in the encounter.   OBJECTIVE:  BP 132/82 mmHg  Pulse 80  Temp(Src) 98.7 F (37.1 C) (Oral)  Resp 18  Ht 5\' 9"  (1.753 m)  Wt 247 lb 12.8 oz (112.401 kg)  BMI 36.58 kg/m2  SpO2 97%  Physical Exam  Constitutional: He is oriented to person, place, and time. He  appears well-developed. He does not appear ill.  HENT:  Right Ear: Hearing and tympanic membrane normal.  Left Ear: Hearing and tympanic membrane normal.  Nose: Mucosal edema (bluish hue) present.  Mouth/Throat: Uvula is midline, oropharynx is clear and moist and mucous membranes are normal.  Eyes: Conjunctivae and EOM are normal. Pupils are equal, round, and reactive to light.  Cardiovascular: Normal rate.   Pulmonary/Chest: Effort normal.  Abdominal: He exhibits no distension.  Musculoskeletal: Normal range of motion.  Neurological: He is alert and oriented to person, place, and time. No cranial nerve deficit. Coordination normal.  Skin: Skin is warm and dry. He is not diaphoretic.  Psychiatric: He has a normal mood and affect.  Nursing note and vitals reviewed.   No results found for this or any previous visit (from the past 48 hour(s)).  ASSESSMENT AND PLAN:  Amuel was seen today for other.  Diagnoses and all orders for this visit:  Seasonal allergies -     cetirizine (ZYRTEC) 10 MG tablet; Take 1 tablet (10 mg total) by mouth daily. -     fluticasone (FLONASE) 50 MCG/ACT nasal spray; Place 2 sprays into both nostrils daily. Take two sprays in each nostril daily. -     pseudoephedrine (SUDAFED) 60 MG tablet; Take 1 tablet (60 mg total) by mouth every 4 (four) hours as needed for congestion. Take for only severe allergies.   The patient was advised to call or return to clinic if he does not see an improvement in symptoms or to seek the care of the closest emergency department if  he worsens with the above plan.   Philis Fendt, MHS, PA-C [facility] 06/29/2015 8:44 AM

## 2015-06-30 NOTE — Progress Notes (Signed)
  Medical screening examination/treatment/procedure(s) were performed by non-physician practitioner and as supervising physician I was immediately available for consultation/collaboration.     

## 2015-07-04 ENCOUNTER — Ambulatory Visit (INDEPENDENT_AMBULATORY_CARE_PROVIDER_SITE_OTHER): Payer: BLUE CROSS/BLUE SHIELD | Admitting: Podiatry

## 2015-07-04 ENCOUNTER — Encounter: Payer: Self-pay | Admitting: Podiatry

## 2015-07-04 ENCOUNTER — Ambulatory Visit: Payer: BLUE CROSS/BLUE SHIELD

## 2015-07-04 ENCOUNTER — Ambulatory Visit (INDEPENDENT_AMBULATORY_CARE_PROVIDER_SITE_OTHER): Payer: BLUE CROSS/BLUE SHIELD

## 2015-07-04 VITALS — BP 135/72 | HR 89 | Resp 16 | Ht 69.0 in | Wt 249.0 lb

## 2015-07-04 DIAGNOSIS — M79673 Pain in unspecified foot: Secondary | ICD-10-CM

## 2015-07-04 DIAGNOSIS — M722 Plantar fascial fibromatosis: Secondary | ICD-10-CM | POA: Diagnosis not present

## 2015-07-04 MED ORDER — MELOXICAM 15 MG PO TABS: 15.0000 mg | ORAL_TABLET | Freq: Every day | ORAL | Status: DC

## 2015-07-04 MED ORDER — TRIAMCINOLONE ACETONIDE 10 MG/ML IJ SUSP
10.0000 mg | Freq: Once | INTRAMUSCULAR | Status: AC
  Administered 2015-07-04: 10 mg

## 2015-07-04 NOTE — Patient Instructions (Signed)

## 2015-07-04 NOTE — Progress Notes (Signed)
   Subjective:    Patient ID: Dominic [name], male    DOB: 23-Jun-1976, 39 y.o.   MRN: [id number]  HPI Patient presents with bilateral foot pain, arch. x1 year  Patient also presents with needing to get new orthotics made today.   Review of Systems  All other systems reviewed and are negative.      Objective:   Physical Exam        Assessment & Plan:

## 2015-07-07 NOTE — Progress Notes (Signed)
Subjective:     Patient ID: Dominic [name], male   DOB: [birth year], 39 y.o.   MRN: [id number]  HPI patient states I'm in getting a lot of pain in my right heel recently and I've had problems with my heel and arch around a year. I also need to get new orthotics as minor fallen apart   Review of Systems  All other systems reviewed and are negative.      Objective:   Physical Exam  Constitutional: He is oriented to person, place, and time.  Cardiovascular: Intact distal pulses.   Musculoskeletal: Normal range of motion.  Neurological: He is oriented to person, place, and time.  Nursing note and vitals reviewed.  neurovascular status intact muscle strength adequate range of motion within normal limits patient noted to have pain in the right over left plantar heel at the insertional point of the tendon into the calcaneus. Patient has moderate depression of the arch overall and is found to be well oriented 3 with good digital perfusion     Assessment:     Acute plantar fasciitis right over left with depression of the arch noted and chronic structural instability    Plan:     H&P and x-rays reviewed with patient. Today I injected the right plantar fascia 3 mg Kenalog 5 mill grams Xylocaine and applied fascial brace with instructions and discussed long-term orthotics when symptoms are reduced. Reappoint to recheck

## 2015-07-17 ENCOUNTER — Institutional Professional Consult (permissible substitution): Payer: BLUE CROSS/BLUE SHIELD | Admitting: Neurology

## 2015-07-25 ENCOUNTER — Ambulatory Visit (INDEPENDENT_AMBULATORY_CARE_PROVIDER_SITE_OTHER): Payer: BLUE CROSS/BLUE SHIELD | Admitting: Podiatry

## 2015-07-25 ENCOUNTER — Encounter: Payer: Self-pay | Admitting: Podiatry

## 2015-07-25 VITALS — BP 117/70 | HR 70 | Resp 12

## 2015-07-25 DIAGNOSIS — M722 Plantar fascial fibromatosis: Secondary | ICD-10-CM | POA: Diagnosis not present

## 2015-07-25 NOTE — Progress Notes (Signed)
Subjective:     Patient ID: Dominic [name], male   DOB: 1976-05-09, 39 y.o.   MRN: [id number]  HPI patient states my heel is feeling quite a bit better but I know I need something to wear as I'm on my feet for a long time each day and I have to walk on cement floors   Review of Systems     Objective:   Physical Exam  neurovascular status intact with patient found to have flatfoot deformity bilateral and is noted to have continued discomfort in the plantar aspect of the heel right at the insertional point tendon into the calcaneus    Assessment:      plantar fasciitis right still present with depressed arch is part of the problem    Plan:      H&P condition reviewed and I advised on physical therapy supportive shoes and I did cast for a Berkley type orthotic today and order to provide for deep heel seat support through the arch and reduction of pressure against the heel. Reappoint when returned

## 2015-08-18 ENCOUNTER — Institutional Professional Consult (permissible substitution): Payer: BLUE CROSS/BLUE SHIELD | Admitting: Neurology

## 2015-09-22 ENCOUNTER — Encounter: Payer: Self-pay | Admitting: Neurology

## 2015-09-22 ENCOUNTER — Ambulatory Visit (INDEPENDENT_AMBULATORY_CARE_PROVIDER_SITE_OTHER): Payer: BLUE CROSS/BLUE SHIELD | Admitting: Neurology

## 2015-09-22 VITALS — BP 146/88 | HR 74 | Resp 20 | Ht 69.0 in | Wt 242.0 lb

## 2015-09-22 DIAGNOSIS — I159 Secondary hypertension, unspecified: Secondary | ICD-10-CM

## 2015-09-22 DIAGNOSIS — E1159 Type 2 diabetes mellitus with other circulatory complications: Secondary | ICD-10-CM | POA: Insufficient documentation

## 2015-09-22 DIAGNOSIS — R0683 Snoring: Secondary | ICD-10-CM | POA: Diagnosis not present

## 2015-09-22 DIAGNOSIS — G4730 Sleep apnea, unspecified: Secondary | ICD-10-CM | POA: Insufficient documentation

## 2015-09-22 DIAGNOSIS — I152 Hypertension secondary to endocrine disorders: Secondary | ICD-10-CM | POA: Insufficient documentation

## 2015-09-22 DIAGNOSIS — G4710 Hypersomnia, unspecified: Secondary | ICD-10-CM | POA: Diagnosis not present

## 2015-09-22 NOTE — Progress Notes (Signed)
SLEEP MEDICINE CLINIC   Provider:  Larey Seat, M D  Referring Provider: Tacy Dura Primary Care Physician:    Chief Complaint  Patient presents with  . New Patient (Initial Visit)    snoring, no sleep study, rm 10, with wife    HPI:  Dominic [name] is a 40 y.o. male , seen here as a referral from [place] for a sleep evaluation,   Chief complaint according to patient : " I snore loudly ".  Dominic [name] has been known to snore loudly and his wife has noted him to stp breathing, too. He usually likes to sleep on his side, but when he resumes a supine position he snores and has apnea. He is a day daytime worker, works outdoors. He lives in [place] for several years and has suffered allergic rhinitis most of the year. He recently had elevated BP , but is not concerned.   Sleep habits are as follows: He usually goes to bed at 10 PM and he is asleep quickly,  No nocturia no other arousals, He wakes in AM at 5 AM , he takes coffee and oatmeal at home and commutes to work about 30 minutes.  He wakens with a parched mouth and reports no headaches. He usually feels as if he needs another hour of sleep. He described his bedroom as cool and dark, not quiet.     Sleep medical history and family sleep history:  His 3 children all are mouth breathers,   Social history: He is a non smoker, beer drinker on the weekends.   Review of Systems: Out of a complete 14 system review, the patient complains of only the following symptoms, and all other reviewed systems are negative. Snoring.   Epworth score 7, Fatigue severity score 38  , depression score 0   Social History   Social History  . Marital Status: Married    Spouse Name: N/A  . Number of Children: N/A  . Years of Education: N/A   Occupational History  .      Demolition work   Social History Main Topics  . Smoking status: Former Smoker -- 0.25 packs/day for 3 years    Types: Cigarettes  . Smokeless tobacco:  Never Used  . Alcohol Use: 0.0 oz/week    5-6 Cans of beer per week     Comment: per week  . Drug Use: No  . Sexual Activity: Not on file   Other Topics Concern  . Not on file   Social History Narrative   Marital status: ; from [place]; [place] 25 years ago.      Children:  4 children; no grandchildren      Tobacco: none    Family History  Problem Relation Age of Onset  . Hypertension Mother   . Diabetes Mother   . Diabetes Father     Past Medical History  Diagnosis Date  . Hyperlipidemia   . Hernia   . Allergy   . GERD (gastroesophageal reflux disease)     Past Surgical History  Procedure Laterality Date  . Inguinal hernia repair  2012    right  . Groin dissection  11/19/2011    Procedure: GROIN EXPLORATION;  Surgeon: Harl Bowie, MD;  Location: [facility];  Service: General;  Laterality: Right;  Right Groin Exploration  . Inguinal hernia repair  11/19/2011    Procedure: HERNIA REPAIR INGUINAL ADULT;  Surgeon: Harl Bowie, MD;  Location:  [facility];  Service: General;  Laterality: Right;  excision of right groin mass    Current Outpatient Prescriptions  Medication Sig Dispense Refill  . cetirizine (ZYRTEC) 10 MG tablet Take 1 tablet (10 mg total) by mouth daily. 30 tablet 11  . fluticasone (FLONASE) 50 MCG/ACT nasal spray Place 2 sprays into both nostrils daily. Take two sprays in each nostril daily. 16 g 12  . loratadine (CLARITIN) 10 MG tablet Take 1 tablet (10 mg total) by mouth daily. 30 tablet 11  . omeprazole (PRILOSEC) 20 MG capsule Take 1 capsule (20 mg total) by mouth daily as needed. 30 capsule 11  . pseudoephedrine (SUDAFED) 60 MG tablet Take 1 tablet (60 mg total) by mouth every 4 (four) hours as needed for congestion. Take for only severe allergies. 30 tablet 0   No current facility-administered medications for this visit.    Allergies as of 09/22/2015  . (No Known Allergies)    Vitals: BP 146/88 mmHg  Pulse 74  Resp 20  Ht 5\' 9"  (1.753 m)  Wt  242 lb (109.77 kg)  BMI 35.72 kg/m2 Last Weight:  Wt Readings from Last 1 Encounters:  09/22/15 242 lb (109.77 kg)   [id number] mass index is 35.72 kg/(m^2).     Last Height:   Ht Readings from Last 1 Encounters:  09/22/15 5\' 9"  (1.753 m)    Physical exam:  General: The patient is awake, alert and appears not in acute distress. The patient is well groomed. Head: Normocephalic, atraumatic. Neck is supple. Mallampati 3,  neck circumference ; 20.5 . Nasal airflow congested , TMJ is not  evident . Retrognathia is*seen.  Cardiovascular:  Regular rate and rhythm  without  murmurs or carotid bruit, and without distended neck veins. Respiratory: Lungs are clear to auscultation. Skin:  Without evidence of edema, or rash Trunk: BMI is elevated . The patient's posture is erect    Neurologic exam : The patient is awake and alert, oriented to place and time.   Memory subjective  described as intact.  Attention span & concentration ability appears normal.  Speech is fluent,  without  dysarthria, dysphonia or aphasia.  Mood and affect are appropriate.  Cranial nerves: Pupils are equal and briskly reactive to light. Funduscopic exam without evidence of pallor or edema.  Extraocular movements  in vertical and horizontal planes intact and without nystagmus. Visual fields by finger perimetry are intact. Hearing to finger rub intact.   Facial sensation intact to fine touch.  Facial motor strength is symmetric and tongue and uvula move midline. Shoulder shrug was symmetrical.   Motor exam:   Normal tone, muscle bulk and symmetric strength in all extremities.  Sensory:  Fine touch, pinprick and vibration were tested in all extremities. Proprioception tested in the upper extremities was normal.  Coordination: Rapid alternating movements in the fingers/hands was normal.  Gait and station: Patient walks without assistive device and is able unassisted to climb up to the exam table. Strength within normal  limits.  Stance is stable and normal.  Deep tendon reflexes: in the  upper and lower extremities are symmetric and intact.  The patient was advised of the nature of the diagnosed sleep disorder , the treatment options and risks for general a health and wellness arising from not treating the condition.  I spent more than 26minutes of face to face time with the patient. Greater than 50% of time was spent in counseling and coordination of care. We have discussed the diagnosis  and differential and I answered the patient's questions.    The patient has been working outdoors as a Writer , he is not daytime sleepy but his nocturnal sleep is not as restorative . Has neither morning headaches not nocturia. Witnessed are loud snoring and apneas.  His records form urgent care were reveiwed.    Assessment:  After physical and neurologic examination, review of laboratory studies,  Personal review of imaging studies, reports of other /same  Imaging studies ,  Results of polysomnography/ neurophysiology testing and pre-existing records as far as provided in visit., my assessment is   1) Dominic [name] built and BMI are  Posing a risk for him to have OSA. The PSG ordered will show [name] to what degree.   2) Nasal rhinitis -  Treated with nasacort, claritin.    3) BMI, carb modified diet discussed and indicated understanding his wife has changed the home cooking.     Plan:  Treatment plan and additional workup :   HST ordered to evaluate and screen for apnea. NO Co2 needed.     Asencion Partridge Tien Aispuro MD  09/22/2015   CC: Dominic Slocumb, Pa-c [address] [place], [place] [address]

## 2015-09-22 NOTE — Patient Instructions (Signed)
Apnea del sueo  (Sleep Apnea)  La apnea del sueo es un trastorno que afecta el dormir de Arts administrator. Una persona con apnea del sueo tiene pausas anormales en la respiracin al dormir. Es difcil para ellos lograr una buena calidad de sueo. Esto hace que se sientan cansados Agricultural consultant. Tambin puede conducir a otros problemas fsicos. Hay tres tipos de apnea del sueo. Uno de ellos es cuando la respiracin se detiene por breve tiempo, porque las vas respiratorias se obstruyen (apnea del sueo obstructiva). Otro tipo cuando el cerebro deja de dar la seal normal de respirar a los msculos que controlan la respiracin (apnea del sueo central). El tercer tipo es una combinacin de los dos tipos anteriores. CUIDADOS EN EL HOGAR   No duerma sobre la espalda. Trate de dormir de lado.  Tome todos los medicamentos segn las indicaciones de su mdico.  Evite el alcohol, los calmantes (sedantes) y las drogas depresoras.  Trate de bajar de peso si tiene sobrepeso. Hable con su mdico acerca de alcanzar una meta de peso saludable. Su mdico podra indicarle el uso de un dispositivo para abrir las vas respiratorias. Puede ayudarle a que reciba el aire que necesita. Se denomina dispositivo de presin positiva (PAP). Hay tres tipos de dispositivos PAP.   Dispositivo de presin positiva continua de las vas respiratorias (CPAP).  Dispositivo de presin espiratoria nasal en las vas areas (EPAP).  Dispositivo de presin positiva de [employer] vas areas (BPAP). ASEGRESE DE QUE:   Comprende estas instrucciones.  Controlar su enfermedad.  Solicitar ayuda de inmediato si no mejora o si empeora.   Esta informacin no tiene Marine scientist el consejo del mdico. Asegrese de hacerle al mdico cualquier pregunta que tenga.   Document Released: 08/14/2010 Document Revised: 11/06/2012 Elsevier Interactive Patient Education 2016 [place]. Sleep Apnea  Sleep apnea is a sleep  disorder characterized by abnormal pauses in breathing while you sleep. When your breathing pauses, the level of oxygen in your blood decreases. This causes you to move out of deep sleep and into light sleep. As a result, your quality of sleep is poor, and the system that carries your blood throughout your body (cardiovascular system) experiences stress. If sleep apnea remains untreated, the following conditions can develop:  High blood pressure (hypertension).  Coronary artery disease.  Inability to achieve or maintain an erection (impotence).  Impairment of your thought process (cognitive dysfunction). There are three types of sleep apnea:  Obstructive sleep apnea--Pauses in breathing during sleep because of a blocked airway.  Central sleep apnea--Pauses in breathing during sleep because the area of the brain that controls your breathing does not send the correct signals to the muscles that control breathing.  Mixed sleep apnea--A combination of both obstructive and central sleep apnea. RISK FACTORS The following risk factors can increase your risk of developing sleep apnea:  Being overweight.  Smoking.  Having narrow passages in your nose and throat.  Being of older age.  Being male.  Alcohol use.  Sedative and tranquilizer use.  Ethnicity. Among individuals younger than 35 years, African Americans are at increased risk of sleep apnea. SYMPTOMS   Difficulty staying asleep.  Daytime sleepiness and fatigue.  Loss of energy.  Irritability.  Loud, heavy snoring.  Morning headaches.  Trouble concentrating.  Forgetfulness.  Decreased interest in sex.  Unexplained sleepiness. DIAGNOSIS  In order to diagnose sleep apnea, your caregiver will perform a physical examination. A sleep study done in the  comfort of your own home may be appropriate if you are otherwise healthy. Your caregiver may also recommend that you spend the night in a sleep lab. In the sleep lab,  several monitors record information about your heart, lungs, and brain while you sleep. Your leg and arm movements and blood oxygen level are also recorded. TREATMENT The following actions may help to resolve mild sleep apnea:  Sleeping on your side.   Using a decongestant if you have nasal congestion.   Avoiding the use of depressants, including alcohol, sedatives, and narcotics.   Losing weight and modifying your diet if you are overweight. There also are devices and treatments to help open your airway:  Oral appliances. These are custom-made mouthpieces that shift your lower jaw forward and slightly open your bite. This opens your airway.  Devices that create positive airway pressure. This positive pressure "splints" your airway open to help you breathe better during sleep. The following devices create positive airway pressure:  Continuous positive airway pressure (CPAP) device. The CPAP device creates a continuous level of air pressure with an air pump. The air is delivered to your airway through a mask while you sleep. This continuous pressure keeps your airway open.  Nasal expiratory positive airway pressure (EPAP) device. The EPAP device creates positive air pressure as you exhale. The device consists of single-use valves, which are inserted into each nostril and held in place by adhesive. The valves create very little resistance when you inhale but create much more resistance when you exhale. That increased resistance creates the positive airway pressure. This positive pressure while you exhale keeps your airway open, making it easier to breath when you inhale again.  Bilevel positive airway pressure (BPAP) device. The BPAP device is used mainly in patients with central sleep apnea. This device is similar to the CPAP device because it also uses an air pump to deliver continuous air pressure through a mask. However, with the BPAP machine, the pressure is set at two different levels.  The pressure when you exhale is lower than the pressure when you inhale.  Surgery. Typically, surgery is only done if you cannot comply with less invasive treatments or if the less invasive treatments do not improve your condition. Surgery involves removing excess tissue in your airway to create a wider passage way.   This information is not intended to replace advice given to you by your health care provider. Make sure you discuss any questions you have with your health care provider.   Document Released: 07/02/2002 Document Revised: 08/02/2014 Document Reviewed: 11/18/2011 Elsevier Interactive Patient Education [employer].

## 2015-09-24 ENCOUNTER — Telehealth: Payer: Self-pay | Admitting: Neurology

## 2015-09-24 ENCOUNTER — Ambulatory Visit (INDEPENDENT_AMBULATORY_CARE_PROVIDER_SITE_OTHER): Payer: BLUE CROSS/BLUE SHIELD

## 2015-09-24 DIAGNOSIS — M722 Plantar fascial fibromatosis: Secondary | ICD-10-CM

## 2015-09-24 DIAGNOSIS — R0683 Snoring: Secondary | ICD-10-CM

## 2015-09-24 DIAGNOSIS — G4730 Sleep apnea, unspecified: Secondary | ICD-10-CM

## 2015-09-24 DIAGNOSIS — G4710 Hypersomnia, unspecified: Secondary | ICD-10-CM

## 2015-09-24 NOTE — Progress Notes (Signed)
Patient ID: Dominic [name], male   DOB: 1975-10-02, 40 y.o.   MRN: [id number] Patient presents to pick up orthotics. Orthotics dispensed with instructions. Patient is to follow-up with any concerns or complications on an as needed basis.

## 2015-09-24 NOTE — Telephone Encounter (Signed)
[employer] denied the HST but approved split night.  Could I get an order for split night?

## 2015-09-24 NOTE — Patient Instructions (Signed)

## 2015-10-10 ENCOUNTER — Ambulatory Visit (INDEPENDENT_AMBULATORY_CARE_PROVIDER_SITE_OTHER): Payer: BLUE CROSS/BLUE SHIELD | Admitting: Neurology

## 2015-10-10 DIAGNOSIS — G4730 Sleep apnea, unspecified: Secondary | ICD-10-CM

## 2015-10-10 DIAGNOSIS — G4733 Obstructive sleep apnea (adult) (pediatric): Secondary | ICD-10-CM | POA: Diagnosis not present

## 2015-10-10 DIAGNOSIS — G4710 Hypersomnia, unspecified: Secondary | ICD-10-CM

## 2015-10-10 DIAGNOSIS — R0683 Snoring: Secondary | ICD-10-CM

## 2015-10-11 NOTE — Sleep Study (Signed)
Please see the scanned sleep study interpretation located in the procedure tab within the chart review section.   

## 2015-10-14 ENCOUNTER — Telehealth: Payer: Self-pay

## 2015-10-14 DIAGNOSIS — G4733 Obstructive sleep apnea (adult) (pediatric): Secondary | ICD-10-CM

## 2015-10-14 NOTE — Telephone Encounter (Signed)
Called to discuss sleep study results. No answer, left a message asking him to call me back.

## 2015-10-15 NOTE — Telephone Encounter (Signed)
Pt returned call. Please call when you have time. Thank you

## 2015-10-15 NOTE — Telephone Encounter (Signed)
Spoke to pt. I advised him that his sleep study revealed that he has osa and during his study when the cpap was applied, it was effective in treating his apnea. Therefore, Dr. Brett Fairy recommends starting a cpap. Pt is agreeable to starting a cpap. I advised him to use his cpap for at least four or more hours per night. Pt verbalized understanding. I advised pt that I would send an order for his cpap to a DME and they would contact him within a week. Pt verbalized understanding. A follow up appt was made for 5/15/ at 8:30. Pt verbalized understanding. Will send to [facility].

## 2015-10-25 ENCOUNTER — Ambulatory Visit (INDEPENDENT_AMBULATORY_CARE_PROVIDER_SITE_OTHER): Payer: BLUE CROSS/BLUE SHIELD | Admitting: Family Medicine

## 2015-10-25 VITALS — BP 126/90 | HR 58 | Temp 97.8°F | Resp 16 | Ht 69.0 in | Wt 244.2 lb

## 2015-10-25 DIAGNOSIS — R7401 Elevation of levels of liver transaminase levels: Secondary | ICD-10-CM

## 2015-10-25 DIAGNOSIS — E781 Pure hyperglyceridemia: Secondary | ICD-10-CM

## 2015-10-25 DIAGNOSIS — E8881 Metabolic syndrome: Secondary | ICD-10-CM | POA: Diagnosis not present

## 2015-10-25 DIAGNOSIS — E669 Obesity, unspecified: Secondary | ICD-10-CM

## 2015-10-25 DIAGNOSIS — R740 Nonspecific elevation of levels of transaminase and lactic acid dehydrogenase [LDH]: Secondary | ICD-10-CM

## 2015-10-25 DIAGNOSIS — R7303 Prediabetes: Secondary | ICD-10-CM | POA: Diagnosis not present

## 2015-10-25 LAB — LIPID PANEL
Cholesterol: 157 mg/dL (ref 125–200)
HDL: 35 mg/dL — ABNORMAL LOW (ref >=40)
LDL CALC: 93 mg/dL (ref <130)
TRIGLYCERIDES: 146 mg/dL (ref <150)
Total CHOL/HDL Ratio: 4.5 Ratio (ref <=5.0)
VLDL: 29 mg/dL (ref <30)

## 2015-10-25 LAB — HEPATITIS A ANTIBODY, TOTAL: Hep A Total Ab: REACTIVE — AB

## 2015-10-25 LAB — COMPREHENSIVE METABOLIC PANEL
ALBUMIN: 4.3 g/dL (ref 3.6–5.1)
ALT: 49 U/L — ABNORMAL HIGH (ref 9–46)
AST: 30 U/L (ref 10–40)
Alkaline Phosphatase: 70 U/L (ref 40–115)
BUN: 12 mg/dL (ref 7–25)
CALCIUM: 9.2 mg/dL (ref 8.6–10.3)
CHLORIDE: 107 mmol/L (ref 98–110)
CO2: 26 mmol/L (ref 20–31)
Creat: 0.72 mg/dL (ref 0.60–1.35)
Glucose, Bld: 99 mg/dL (ref 65–99)
Potassium: 4.3 mmol/L (ref 3.5–5.3)
Sodium: 142 mmol/L (ref 135–146)
Total Bilirubin: 1.5 mg/dL — ABNORMAL HIGH (ref 0.2–1.2)
Total Protein: 7.3 g/dL (ref 6.1–8.1)

## 2015-10-25 LAB — HEPATITIS C ANTIBODY: HCV Ab: NEGATIVE

## 2015-10-25 LAB — HEPATITIS B CORE ANTIBODY, TOTAL: Hep B Core Total Ab: NONREACTIVE

## 2015-10-25 LAB — POCT GLYCOSYLATED HEMOGLOBIN (HGB A1C): Hemoglobin A1C: 5.6

## 2015-10-25 LAB — HEPATITIS B SURFACE ANTIBODY,QUALITATIVE: Hep B S Ab: NEGATIVE

## 2015-10-25 NOTE — Patient Instructions (Addendum)
It is important to take an omega-3/fish oil supplement that is highest in the [place] and EPA types of omega-3 so look for those under the active ingredients and pick the one with the most during the next time you purchase supplements. Sometimes the the store brands will actually end up being the best for the least cost. Do not pay attention to the total mg listed on the front of the bottle.  You may also want to consider adding in flax seed, niacin, and/or red yeast rice to help lower your cholesterol. Recheck fasting cholesterol in 1 year.   IF you received an x-ray today, you will receive an invoice from [facility]. Please contact [facility] at [phone] with questions or concerns regarding your invoice.   IF you received labwork today, you will receive an invoice from [employer]. Please contact [facility] at [phone] with questions or concerns regarding your invoice.   Our billing staff will not be able to assist you with questions regarding bills from these companies.  You will be contacted with the lab results as soon as they are available. The fastest way to get your results is to activate your My Chart account. Instructions are located on the last page of this paperwork. If you have not heard from [name] regarding the results in 2 weeks, please contact this office.    Dieta para el sndrome metablico (Diet for Metabolic Syndrome) El sndrome metablico es un trastorno que incluye al menos tres de estas afecciones:  Obesidad abdominal.  Tamsen Meek de azcar en la sangre.  Hipertensin arterial.  Una cantidad ms elevada de lo normal de grasa (lpidos) en la sangre.  Un nivel por debajo de lo normal de colesterol "bueno" (HDL). Seguir una dieta saludable puede ayudar a mantener el sndrome metablico bajo control. Tambin puede ayudar a Scientist, product/process development de afecciones asociadas con este sndrome, como diabetes, cardiopata coronaria e  ictus. Junto con el ejercicio, una dieta saludable:  Ayuda a mejorar la forma en que el organismo [place] la [place].  Promueve la prdida de [place]. Un objetivo comn de las personas con esta afeccin es Sports coach por lo menos entre el siete y el diez por ciento del peso inicial. QU DEBO SABER ACERCA DE ESTA [place]?  Use el ndice glucmico (IG) para planificar las comidas. El ndice le informa con qu rapidez un alimento elevar su nivel de azcar en la sangre. Elija alimentos con bajos valores de ndice glucmico. Estos tardan ms tiempo en subir el nivel de Dispensing optician.  Lleve un registro de la cantidad de caloras que ingiere. La ingesta de la cantidad correcta de caloras lo ayudar a Science writer un peso saludable.  Tal vez deba seguir Web designer. Esta dieta incluye gran cantidad de verduras, carnes magras de pescado, cereales integrales, frutas, as como aceites y grasas saludables. QU ALIMENTOS PUEDO COMER? Cereales Salvado molido. Pan integral de centeno. Pan, galletitas, tortillas, cereales y pastas integrales. Avena sin azcar.Trigo burgol.Cebada.Quinua.Arroz integral y arroz salvaje. [place] Valeda Malm. [place]. Guisantes. Remolachas. Coliflor. Repollo. Brcoli. Zanahorias. Tomates. [place]. Augustin Coupe. Hierbas. Pimienta. Cebollas. Pepinos. Repollitos de Bruselas. Batatas. ames. Frijoles. Lentejas. Frutas Frutos rojos. Manzanas. Naranjas. Uvas. Mango. [place]. Kiwi. Cerezas. Carnes y otras fuentes de protenas Frutos de mar y Occupational hygienist. Carnes magras.Aves. Tofu. Lcteos Productos lcteos sin grasa o con bajo contenido de [place], [place], yogur y [place]. [employer]. Leche con bajo contenido de [place]. Productos alternativos [place], como leche de soja o de [place].  Jugo de frutas naturales. Condimentos [place] con bajo contenido de Location manager o sin azcar, salsa barbacoa y [place]. Mostaza. Salsa de pepinillos. Grasas y Medical laboratory scientific officer. Aceite de canola o de  oliva. Nueces y [place] de frutos secos.Semillas. Los artculos mencionados arriba pueden no ser [employer] de las bebidas o los alimentos recomendados. Comunquese con el nutricionista para conocer ms opciones. QU ALIMENTOS NO SE RECOMIENDAN? Carne roja. Aceite de palma y de coco. Alimentos procesados. Comidas fritas. Alcohol. Bebidas azucaradas, como t helado y gaseosas. Dulces. Alimentos salados. Los artculos mencionados arriba pueden no ser [employer] de las bebidas y los alimentos que se Higher education careers adviser. Comunquese con el nutricionista para obtener ms informacin.   Esta informacin no tiene Marine scientist el consejo del mdico. Asegrese de hacerle al mdico cualquier pregunta que tenga.   Document Released: 11/26/2014 Elsevier Interactive Patient Education 2016 [place] (Metabolic Syndrome) El sndrome metablico es la presencia de por lo menos tres factores que aumentan el riesgo de una persona de tener enfermedades cardiovasculares y diabetes. 78 factores son los siguientes:  Nivel elevado de Dispensing optician.  Nivel elevado de triglicridos en la sangre.  Hipertensin arterial.  Niveles bajos de colesterol bueno en la sangre (lipoprotenas de alta densidad o HDL).  Exceso de peso alrededor de la cintura. Este factor est presente cuando la medicin del contorno de la cintura es la siguiente:  Ms de 40pulgadas (101cm) en los hombres.  Ms de 35pulgadas (89cm) en las mujeres. A veces, el sndrome metablico recibe el nombre de sndrome de resistencia a la insulina y sndromeX. CAUSAS La causa exacta no se conoce, pero los factores genticos y las opciones en cuanto al estilo de vida juegan un papel. FACTORES DE RIESGO Tiene ms probabilidades de desarrollar sndrome metablico en los siguientes casos:  Sigue una dieta con alto contenido de caloras y grasas saturadas.  No hace ejercicio  habitualmente.  Tiene sobrepeso.  Tiene antecedentes familiares de sndrome metablico.  Es asitico.  Es una persona de [place].  Tiene resistencia a la insulina.  Consume algn producto que contenga tabaco, como cigarrillos, tabaco de Higher education careers adviser o Psychologist, sport and exercise. [place] no causa sntomas. DIAGNSTICO Para hacer un diagnstico, el mdico determinar, de la siguiente forma, si tiene por lo menos tres de los factores que indican la presencia del sndrome metablico:  Le tomar la presin arterial.  Le medir el contorno de la cintura.  Le pedir [employer]. TRATAMIENTO El tratamiento puede incluir lo siguiente:  Cambios en el estilo de vida para reducir el riesgo de tener enfermedades cardacas e ictus, por ejemplo:  Actividad fsica.  Prdida de peso.  Mantener una dieta saludable.  No consumir ningn producto que contenga tabaco, incluidos cigarrillos, tabaco de Higher education careers adviser o cigarrillos electrnicos.  Medicamentos que:  Ayudan al organismo a mantener la glucosa bajo control.  Reducen la presin arterial y las concentraciones de triglicridos en la sangre. INSTRUCCIONES PARA EL CUIDADO EN EL HOGAR  Haga ejercicios regularmente.  Mantenga una dieta saludable.  No consuma ningn producto que contenga tabaco, lo que incluye cigarrillos, tabaco de Higher education careers adviser o Psychologist, sport and exercise. Si necesita ayuda para dejar de fumar, consulte al mdico.  Concurra a todas las visitas de control como se lo haya indicado el mdico. Esto es importante.  Mdase habitualmente el contorno de la cintura y Artist. Para medirse el contorno de la cintura:  Prese erguido.  Exhale.  Coloque  la cinta mtrica alrededor de la parte de la cintura que est justo por encima de las caderas.  Lea la medida. SOLICITE ATENCIN MDICA SI:  Se siente muy cansado.  Sed excesiva.  Aumento de la cantidad [place].  Aumenta de peso  alrededor de la cintura.  Tiene dolores de cabeza frecuentes.  Tiene un episodio de [place]. SOLICITE ATENCIN MDICA DE INMEDIATO SI:  Tiene visin borrosa que aparece sbitamente.  Tiene un episodio de mareo repentino.  Tiene problemas repentinos para hablar o tragar.  Tiene debilidad repentina en los brazos o las piernas.  [place] o dificultad para Ambulance person.  Siente que los latidos cardacos no son normales.  Se desmaya.   Esta informacin no tiene Marine scientist el consejo del mdico. Asegrese de hacerle al mdico cualquier pregunta que tenga.   Document Released: 04/26/2014 Elsevier Interactive Patient Education [employer].

## 2015-10-25 NOTE — Progress Notes (Signed)
Subjective:  By signing my name below, I, Dominic [name], attest that this documentation has been prepared under the direction and in the presence of Delman Cheadle, MD. Electronically Signed: Moises [name], [place]. 10/25/2015 , 8:33 AM .  Patient was seen in Room 10 .   Patient ID: Dominic [name], male    DOB: 21-Nov-1975, 40 y.o.   MRN: [id number] Chief Complaint  Patient presents with  . Diabetes    recheck labs for his diabetes   HPI Dominic [name] is a 40 y.o. male who presents to [facility] requesting to recheck labs for his diabetes.  Last seen here for his full physical 4 months prior. He was pre-diabetic with elevated A1c from 5.2 to 5.8. Advised to follow up in 3-4 months for repeat labs. All other screening labs were normal except elevated ALT at 64; his lipid panel consistent with metabolic syndrome. He did have a RUQ US done 18 months prior which showed fatty liver vs hepato fatty liver disease. He's not had any prior hepatitis testing. He's gained 20 lbs in past year, but overall stable in past several months.   He was advised to stop drinking gatorade and switched to just drinking water. He's been eating vegetables. He dropped his alcohol consumption. He's generally feeling well. He denies any urinary symptoms, polyphagia, polydipsia, or feeling fatigue.   Past Medical History  Diagnosis Date  . Hyperlipidemia   . Hernia   . Allergy   . GERD (gastroesophageal reflux disease)    Prior to Admission medications   Medication Sig Start Date End Date Taking? Authorizing Provider  fluticasone (FLONASE) 50 MCG/ACT nasal spray Place 2 sprays into both nostrils daily. Take two sprays in each nostril daily. 06/29/15  Yes Tereasa Coop, PA-C  loratadine (CLARITIN) 10 MG tablet Take 1 tablet (10 mg total) by mouth daily. 11/10/14  Yes Wardell Honour, MD  cetirizine (ZYRTEC) 10 MG tablet Take 1 tablet (10 mg total) by mouth daily. Patient not taking: Reported on 10/25/2015 06/29/15   Tereasa Coop, PA-C  omeprazole (PRILOSEC) 20 MG capsule Take 1 capsule (20 mg total) by mouth daily as needed. Patient not taking: Reported on 10/25/2015 06/06/15   Ezekiel Slocumb, PA-C  pseudoephedrine (SUDAFED) 60 MG tablet Take 1 tablet (60 mg total) by mouth every 4 (four) hours as needed for congestion. Take for only severe allergies. Patient not taking: Reported on 10/25/2015 06/29/15   Tereasa Coop, PA-C   No Known Allergies  Review of Systems  Constitutional: Negative for fever, chills and fatigue.  Gastrointestinal: Negative for nausea, vomiting and diarrhea.  Endocrine: Negative for polydipsia and polyphagia.  Genitourinary: Negative for dysuria, urgency, frequency and hematuria.  Neurological: Negative for dizziness and headaches.      Objective:   Physical Exam  Constitutional: He is oriented to person, place, and time. He appears well-developed and well-nourished. No distress.  HENT:  Head: Normocephalic and atraumatic.  Eyes: EOM are normal. Pupils are equal, round, and reactive to light.  Neck: Neck supple. No thyromegaly present.  Cardiovascular: Normal rate, regular rhythm, S1 normal, S2 normal and normal heart sounds.   No murmur heard. Pulmonary/Chest: Effort normal and breath sounds normal. No respiratory distress. He has no wheezes.  Musculoskeletal: Normal range of motion.  Neurological: He is alert and oriented to person, place, and time.  Skin: Skin is warm and dry.  Psychiatric: He has a normal mood and affect. His behavior is normal.  Nursing  note and vitals reviewed.  BP 126/90 mmHg  Pulse 58  Temp(Src) 97.8 F (36.6 C) (Oral)  Resp 16  Ht 5\' 9"  (1.753 m)  Wt 244 lb 3.2 oz (110.768 kg)  BMI 36.05 kg/m2  SpO2 98%   Results for orders placed or performed in visit on 10/25/15  POCT glycosylated hemoglobin (Hb A1C)  Result Value Ref Range   Hemoglobin A1C 5.6        Assessment & Plan:   1. Prediabetes - resolved.  Cont with the excellent tlc efforts he  has undertaken as still w/ metabolic syndrome due tocombo of/ abd obesity, elev trig low hdl and glucose intolerance  2. Elevated transaminase level   3. Hypertriglyceridemia   4. Obesity   5. Metabolic syndrome     Orders Placed This Encounter  Procedures  . Comprehensive metabolic panel  . Hepatitis A antibody, total  . Hepatitis B core antibody, total  . Hepatitis B e antibody  . Hepatitis B surface antibody  . Hepatitis C antibody  . Lipid panel    Order Specific Question:  Has the patient fasted?    Answer:  Yes  . POCT glycosylated hemoglobin (Hb A1C)    I personally performed the services described in this documentation, which was scribed in my presence. The recorded information has been reviewed and considered, and addended by me as needed.  Delman Cheadle, MD MPH

## 2015-10-29 ENCOUNTER — Encounter: Payer: Self-pay | Admitting: Family Medicine

## 2015-10-29 LAB — HEPATITIS B E ANTIBODY: HEPATITIS BE ANTIBODY: NONREACTIVE

## 2015-10-31 DIAGNOSIS — G4733 Obstructive sleep apnea (adult) (pediatric): Secondary | ICD-10-CM | POA: Diagnosis not present

## 2015-11-07 ENCOUNTER — Ambulatory Visit (INDEPENDENT_AMBULATORY_CARE_PROVIDER_SITE_OTHER): Payer: BLUE CROSS/BLUE SHIELD | Admitting: Physician Assistant

## 2015-11-07 VITALS — BP 122/78 | HR 67 | Temp 98.5°F | Resp 15 | Ht 68.0 in | Wt 239.6 lb

## 2015-11-07 DIAGNOSIS — J3089 Other allergic rhinitis: Secondary | ICD-10-CM

## 2015-11-07 DIAGNOSIS — Z719 Counseling, unspecified: Secondary | ICD-10-CM | POA: Diagnosis not present

## 2015-11-07 MED ORDER — METHYLPREDNISOLONE ACETATE 80 MG/ML IJ SUSP
80.0000 mg | Freq: Once | INTRAMUSCULAR | Status: AC
  Administered 2015-11-07: 80 mg via INTRAMUSCULAR

## 2015-11-07 NOTE — Patient Instructions (Addendum)
Please continue to lose 1-2 lbs per week.  You long tern weight loss goal is 195 lbs.  KEEP UP THE GOOD WORK!    IF you received an x-ray today, you will receive an invoice from [facility]. Please contact [facility] at [phone] with questions or concerns regarding your invoice.   IF you received labwork today, you will receive an invoice from [employer]. Please contact [facility] at [phone] with questions or concerns regarding your invoice.   Our billing staff will not be able to assist you with questions regarding bills from these companies.  You will be contacted with the lab results as soon as they are available. The fastest way to get your results is to activate your My Chart account. Instructions are located on the last page of this paperwork. If you have not heard from [name] regarding the results in 2 weeks, please contact this office.

## 2015-11-07 NOTE — Progress Notes (Signed)
11/07/2015 8:58 AM   DOB: 1976-04-20 / MRN: [id number]  SUBJECTIVE:  Dominic CHIASSON is a 40 y.o. male presenting for sneezing, cough, itchy eyes, mouth and ears.  States this started about 1 weeks ago.  He has been taking pseudoephedrine, flonase and claritin with some relief but is worried that he will get worse with spring time allergies, which historically causes his allergies to go out of control.    Her reports that he has lost 10 lbs since I last saw him.  He is doing this through diet and physical activity.  He is continuing to try to lose and would like a long tern goal.  Other than his chief complaint he is feeling better and has more energy.    He has No Known Allergies.   He  has a past medical history of Hyperlipidemia; Hernia; Allergy; and GERD (gastroesophageal reflux disease).    He  reports that he has quit smoking. His smoking use included Cigarettes. He has a .75 pack-year smoking history. He has never used smokeless tobacco. He reports that he drinks alcohol. He reports that he does not use illicit drugs. He  has no sexual activity history on file. The patient  has past surgical history that includes Inguinal hernia repair (2012); Groin dissection (11/19/2011); and Inguinal hernia repair (11/19/2011).  His family history includes Diabetes in his father and mother; Hypertension in his mother.  Review of Systems  Constitutional: Negative for fever.  Gastrointestinal: Negative for nausea.  Skin: Negative for rash.  Neurological: Negative for dizziness.  Endo/Heme/Allergies: Negative for polydipsia.    Problem list and medications reviewed and updated by myself where necessary, and exist elsewhere in the encounter.   OBJECTIVE:  BP 122/78 mmHg  Pulse 67  Temp(Src) 98.5 F (36.9 C) (Oral)  Resp 15  Ht 5\' 8"  (1.727 m)  Wt 239 lb 9.6 oz (108.682 kg)  BMI 36.44 kg/m2  SpO2 98%  Physical Exam  Constitutional: He is oriented to person, place, and time. He appears  well-developed. He does not appear ill.  HENT:  Right Ear: Tympanic membrane normal.  Left Ear: Tympanic membrane normal.  Nose: Mucosal edema (bluish hue) and rhinorrhea (clear) present.  Mouth/Throat: Uvula is midline.    Eyes: Conjunctivae and EOM are normal. Pupils are equal, round, and reactive to light.  Cardiovascular: Normal rate and regular rhythm.   Pulmonary/Chest: Effort normal and breath sounds normal.  Abdominal: He exhibits no distension.  Musculoskeletal: Normal range of motion.  Neurological: He is alert and oriented to person, place, and time. No cranial nerve deficit. Coordination normal.  Skin: Skin is warm and dry. He is not diaphoretic.  Psychiatric: He has a normal mood and affect.  Nursing note and vitals reviewed.   No results found for this or any previous visit (from the past 72 hour(s)).  No results found.  ASSESSMENT AND PLAN  Ewel was seen today for immunizations.  Diagnoses and all orders for this visit:  Environmental and seasonal allergies: Advised that if his symptoms return and he is taking all of his allergy medication that he would benefit from as allergy referral for a long term solution.  -     methylPREDNISolone acetate (DEPO-MEDROL) injection 80 mg; Inject 1 mL (80 mg total) into the muscle once.  Health education/counseling: His health is improving with wight loss.  Labs discussed.  Advised he continue the course and strive to lose 1-2 lbs per week through continue physical activity and prudent  diet choices.   The patient was advised to call or return to clinic if he does not see an improvement in symptoms or to seek the care of the closest emergency department if he worsens with the above plan.   Philis Fendt, MHS, PA-C [facility] 11/07/2015 8:58 AMo

## 2015-11-09 IMAGING — CR DG RIBS W/ CHEST 3+V*R*
3 series · 3 of 3 positions shown · non-contrast
Comparison: Concurrently obtained chest x-ray

CLINICAL DATA: 38-year-old male with 3 week history of right upper
quadrant and chest pain.

EXAM:
RIGHT RIBS AND CHEST - 3+ VIEW

[PA]
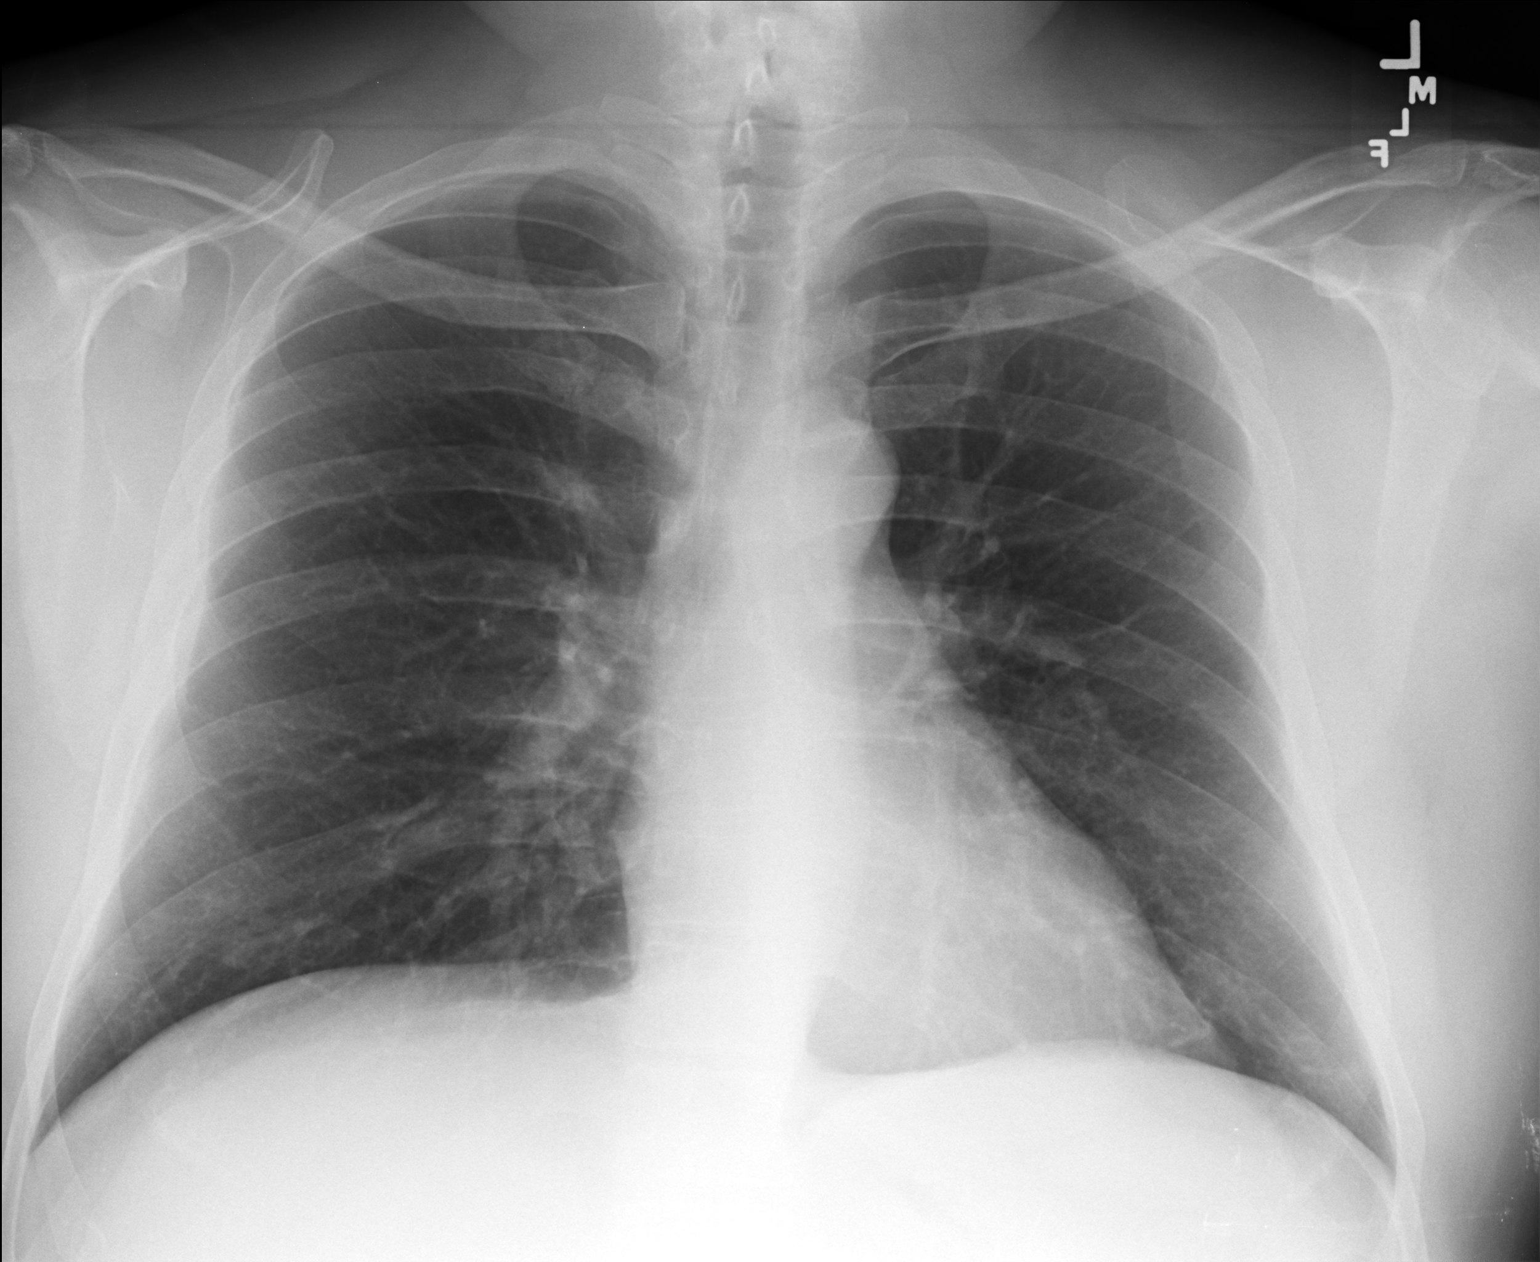

[rpo (1 of 2)]
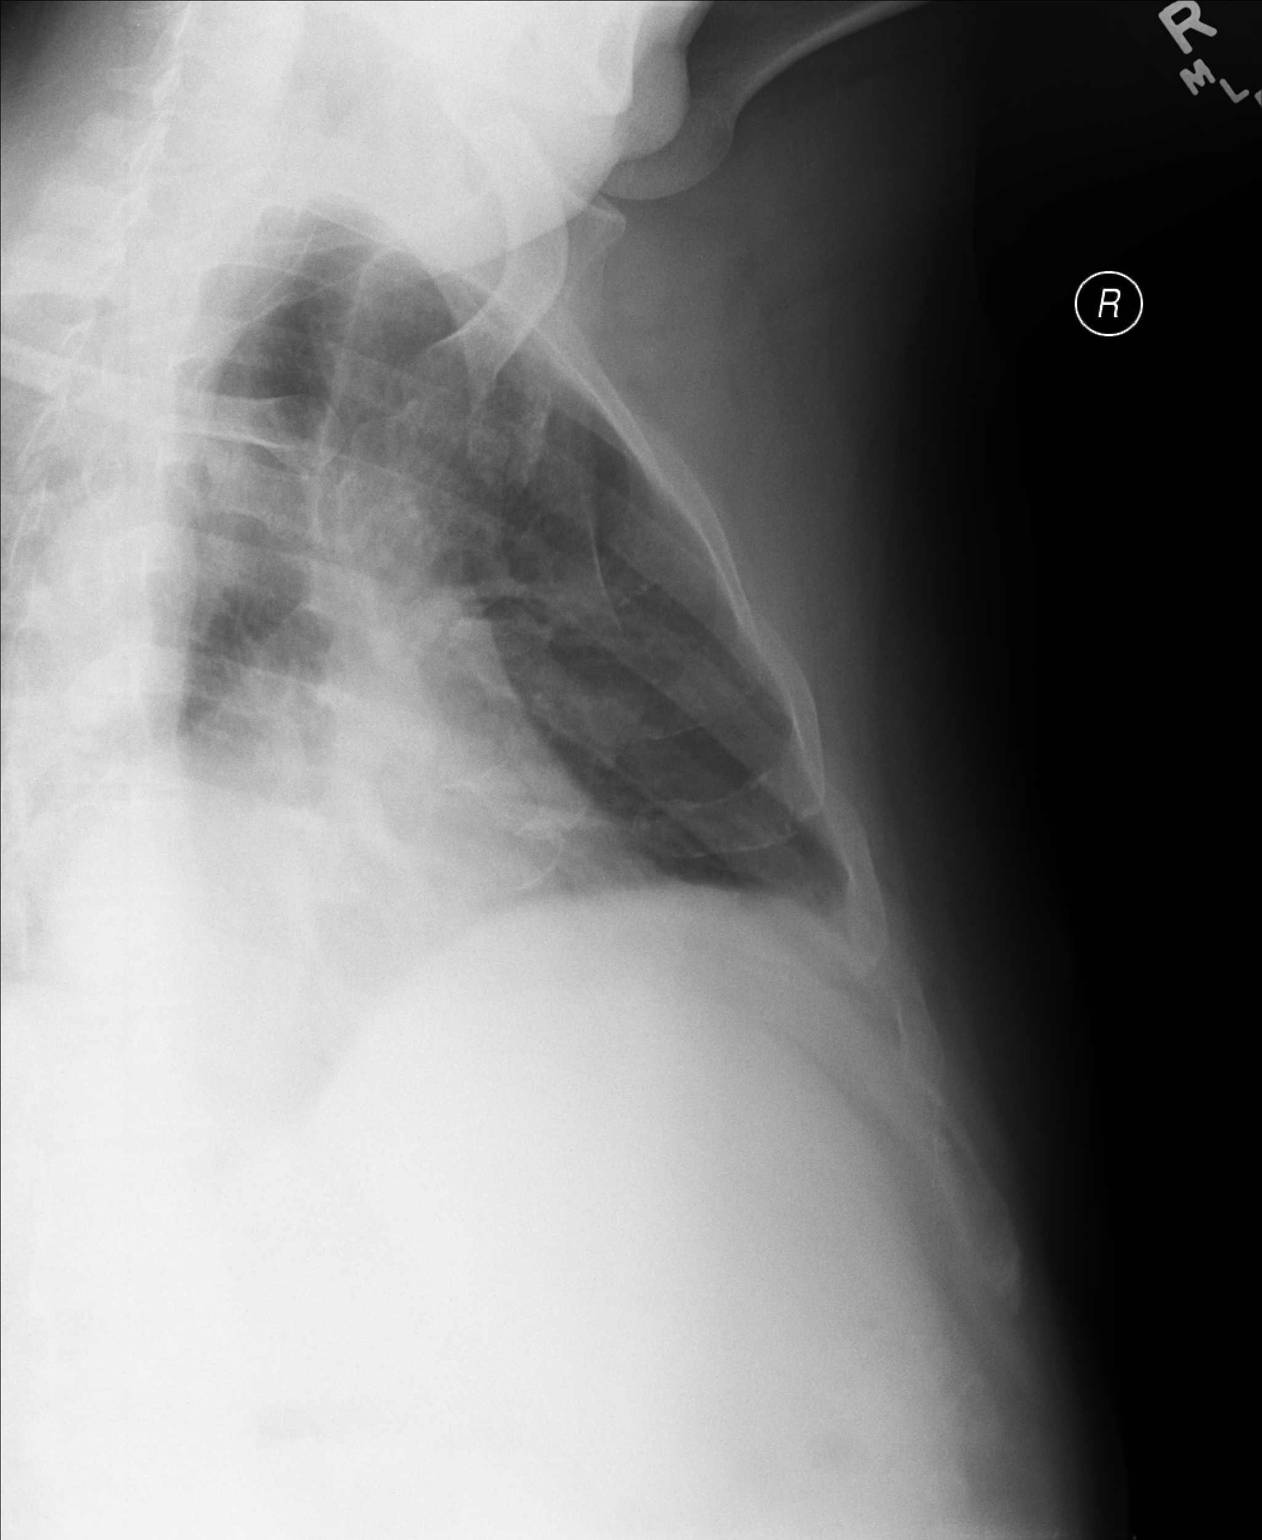

[rpo (2 of 2)]
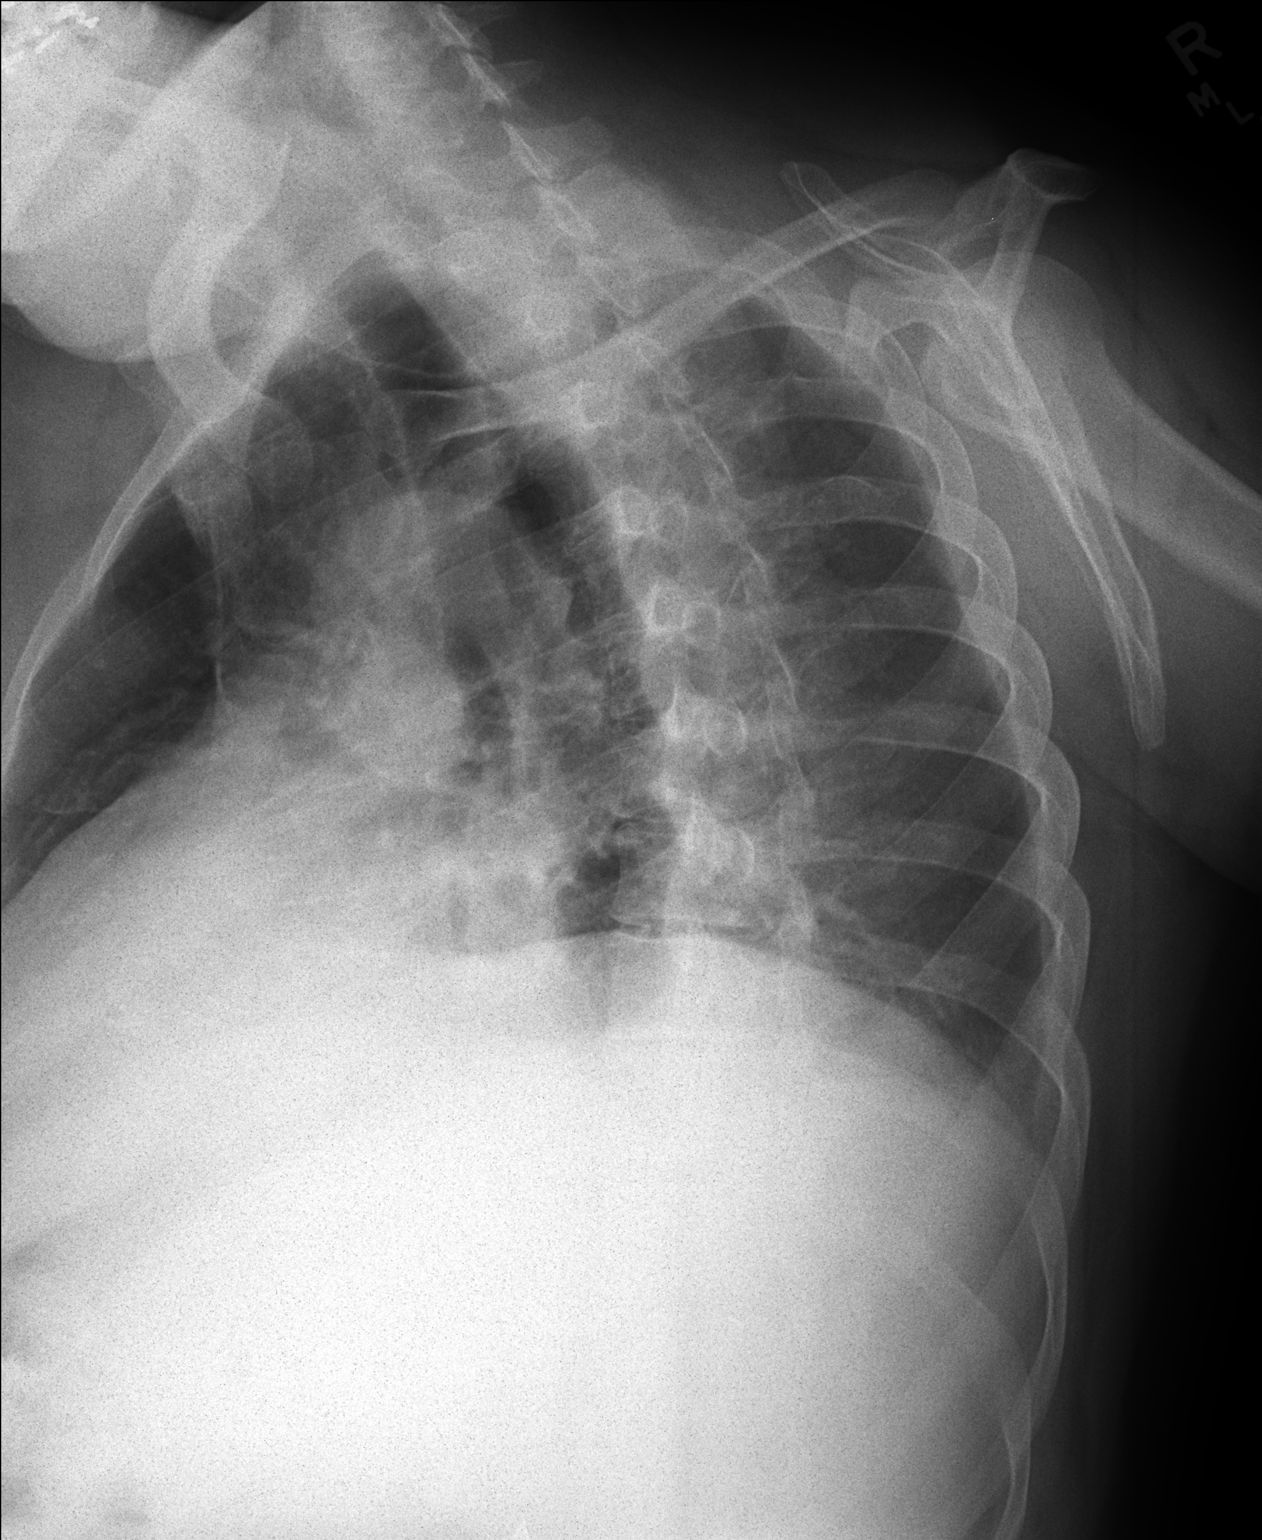

[3 of 3 positions shown; findings below may reference images not displayed]

FINDINGS: No fracture or other bone lesions are seen involving the ribs. There
is no evidence of pneumothorax or pleural effusion. Both lungs are
clear. Heart size and mediastinal contours are within normal limits.
IMPRESSION: Negative.

## 2015-11-09 IMAGING — CR DG CHEST 1V
1 series · 1 of 1 positions shown · non-contrast
Comparison: Concurrently obtained radiographs of the right ribs

CLINICAL DATA: 38-year-old male with 3 week history of right upper
quadrant/ chest pain which is worse when he lies down. Initial
encounter

EXAM:
CHEST - 1 VIEW

[lateral]
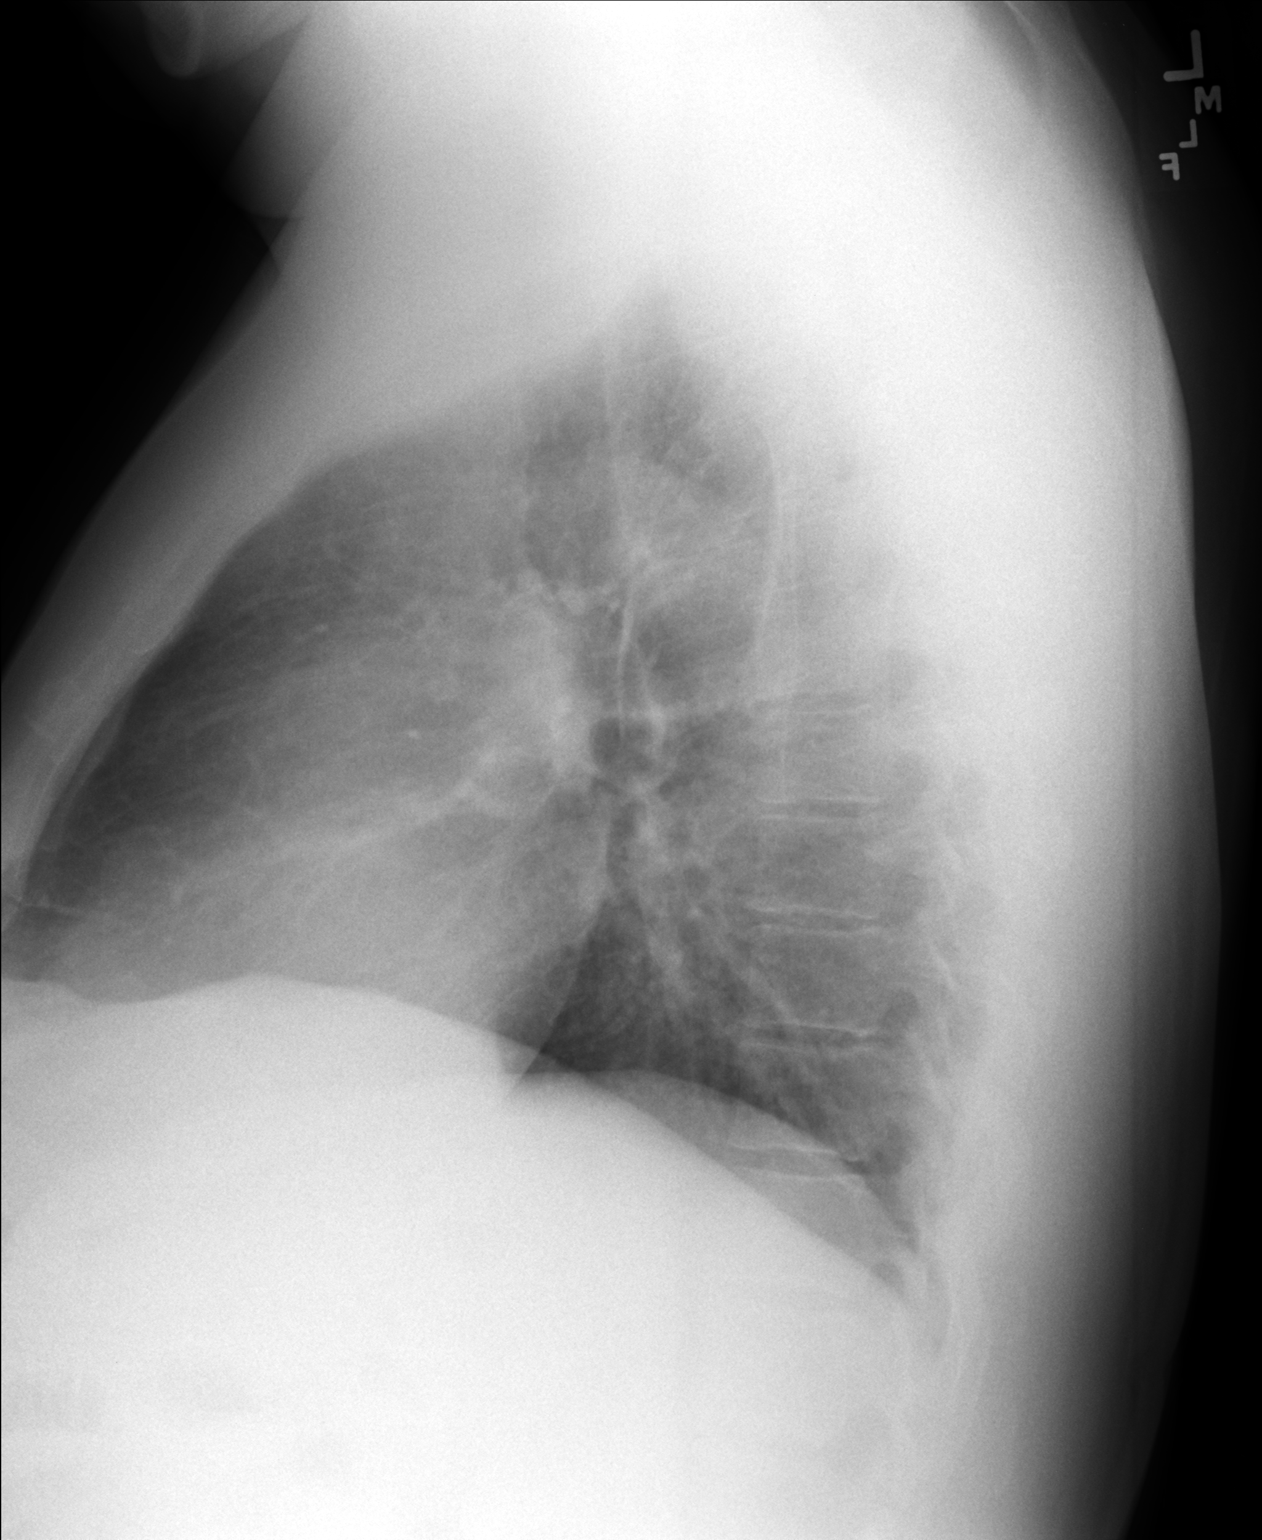

[1 of 1 positions shown; findings below may reference images not displayed]

FINDINGS: The frontal view of the chest is included with the rib series
images.

The lungs are clear and negative for focal airspace consolidation,
pulmonary edema or suspicious pulmonary nodule. No pleural effusion
or pneumothorax. Cardiac and mediastinal contours are within normal
limits. No acute fracture or lytic or blastic osseous lesions. The
visualized upper abdominal bowel gas pattern is unremarkable.
IMPRESSION: No active cardiopulmonary disease.

## 2015-11-16 IMAGING — US US ABDOMEN LIMITED
1 series · 14 of 25 positions shown · non-contrast
Comparison: None.

CLINICAL DATA: Right upper quadrant pain for 4 weeks .

EXAM:
US ABDOMEN LIMITED - RIGHT UPPER QUADRANT

[Series 1: us abdomen limited · 0.30mm/px · 14 of 39 slices shown]
[im 1/39]
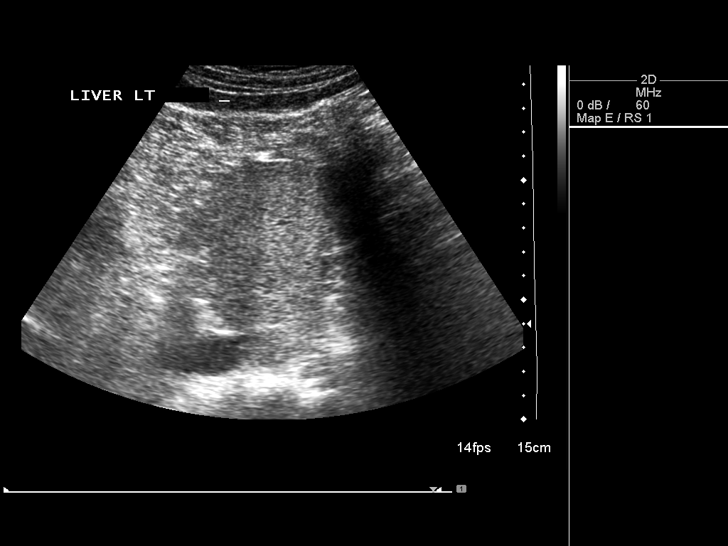
[im 4/39]
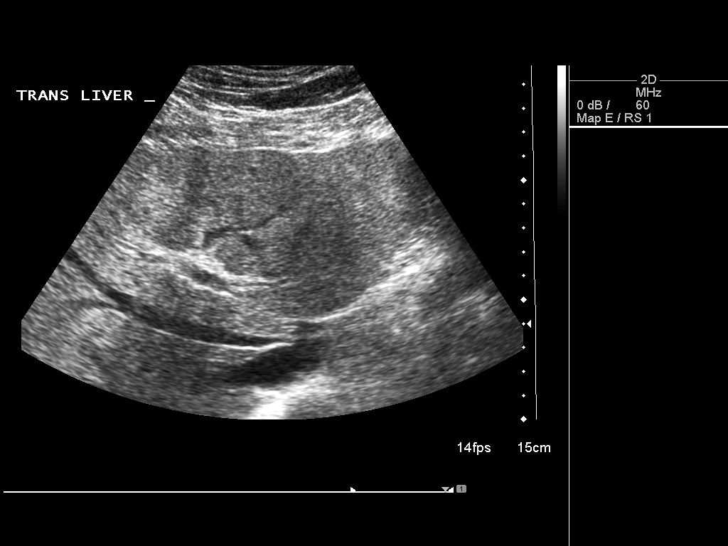
[im 7/39]
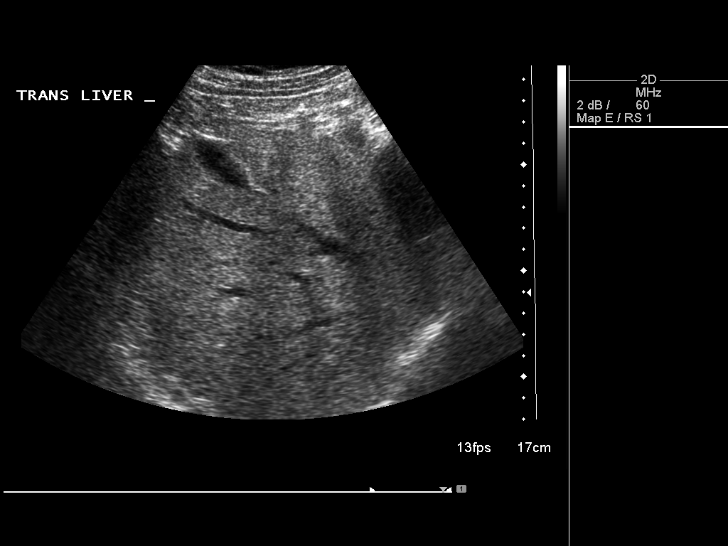
[im 10/39]
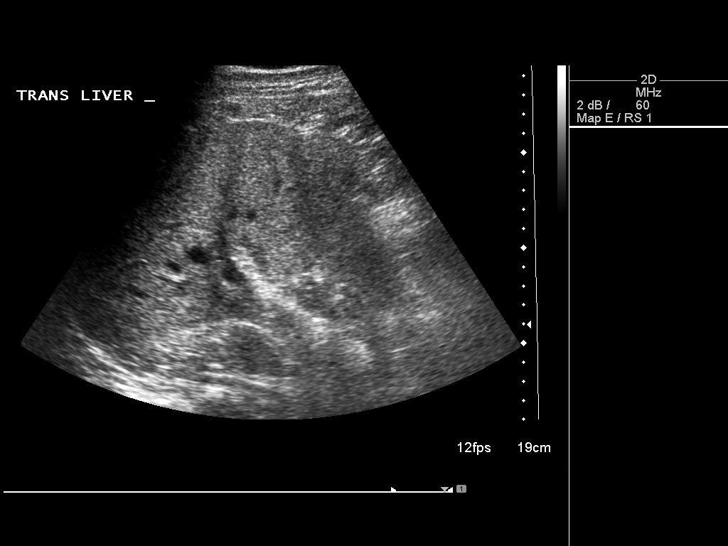
[im 13/39]
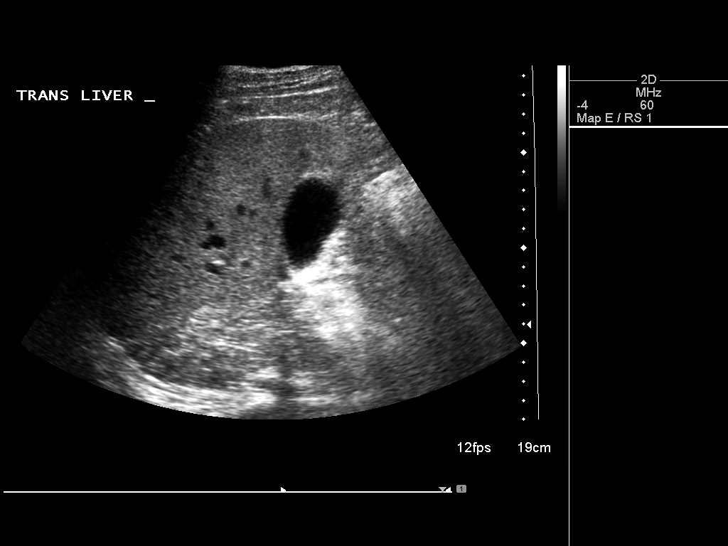
[im 15/39]
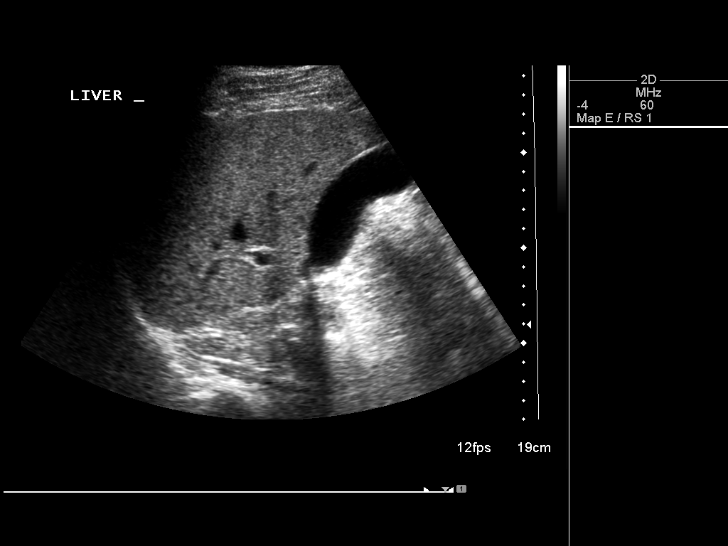
[im 18/39]
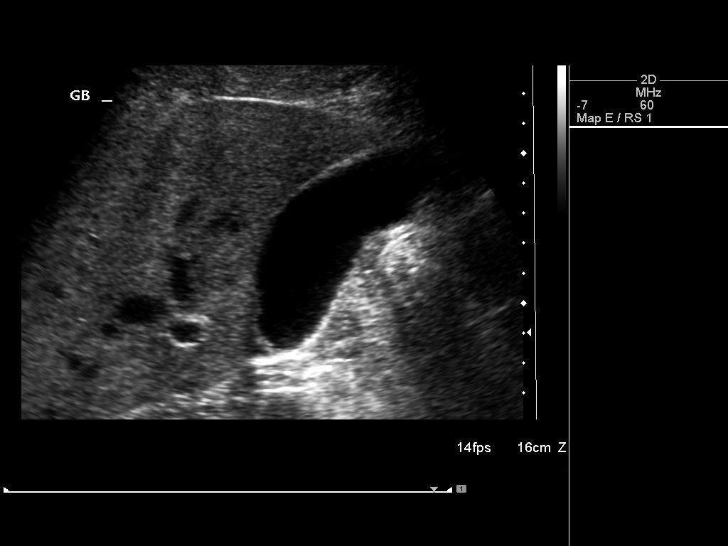
[im 21/39]
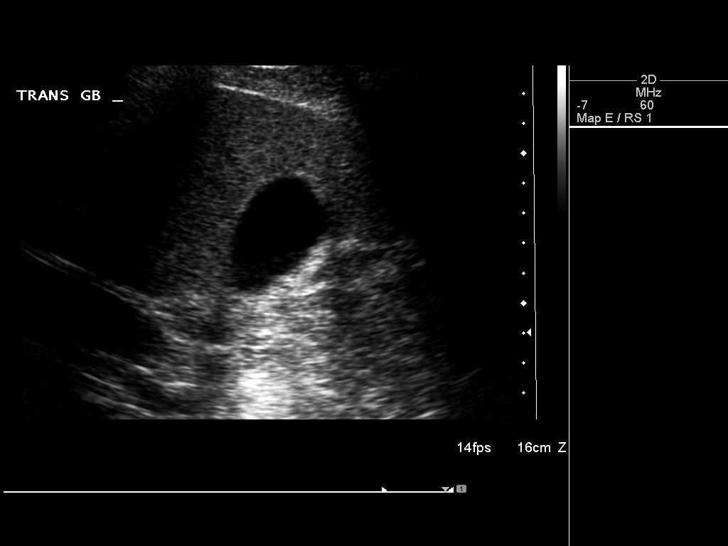
[im 24/39]
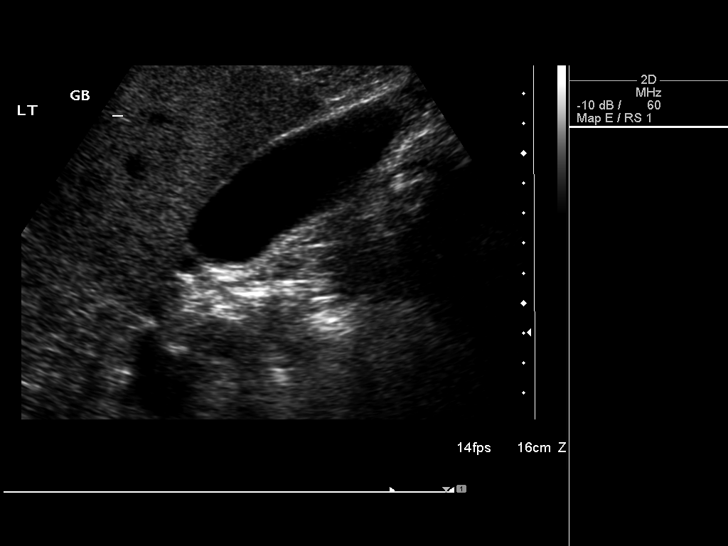
[im 26/39]
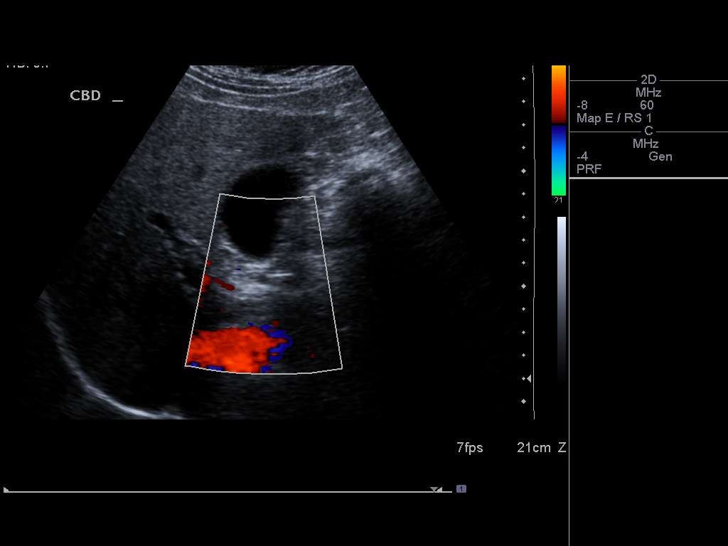
[im 29/39]
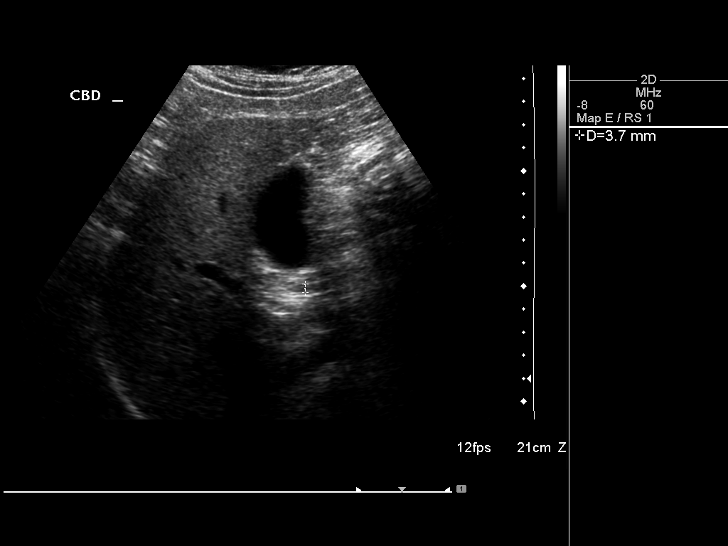
[im 32/39]
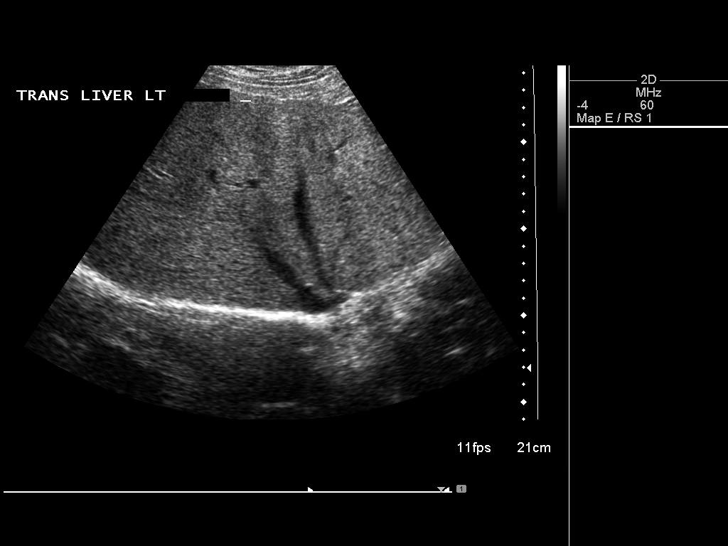
[im 35/39]
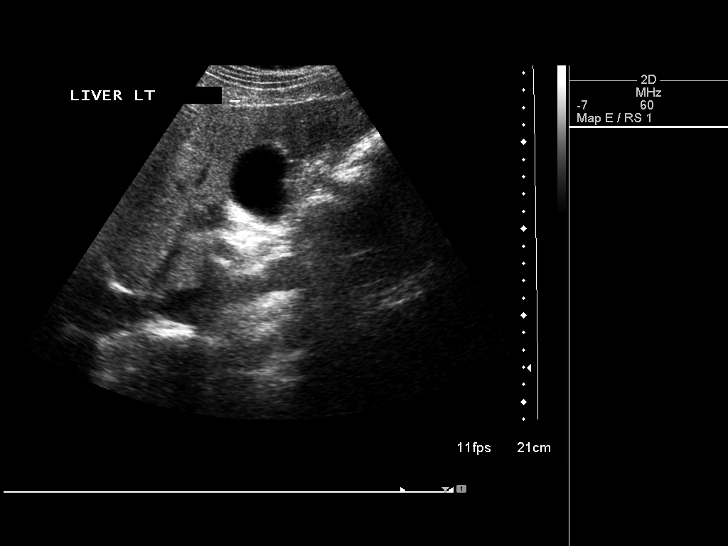
[im 39/39]
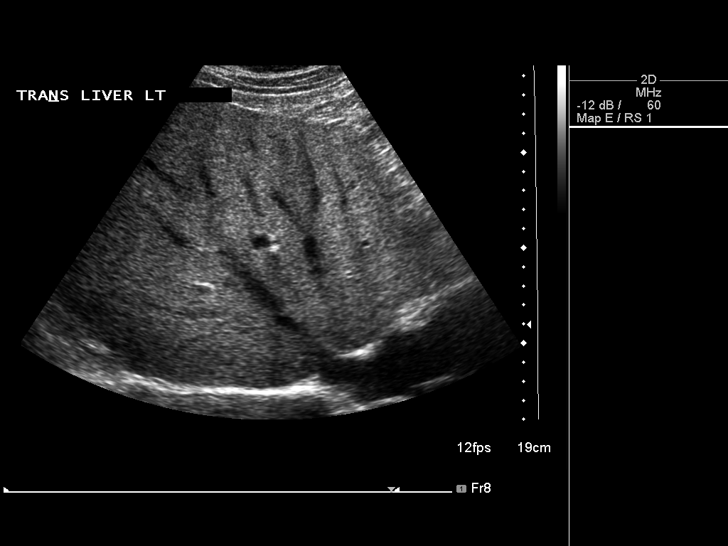

[14 of 25 positions shown; findings below may reference images not displayed]

FINDINGS: Gallbladder:

No gallstones or wall thickening visualized. No sonographic Murphy
sign noted.

Common bile duct:

Diameter: 4.0 mm

Liver:

Liver is echogenic consistent with fatty infiltration and/or
hepatocellular disease.
IMPRESSION: Echogenic liver consistent with fatty infiltration and/or
hepatocellular disease.

## 2015-11-30 DIAGNOSIS — G4733 Obstructive sleep apnea (adult) (pediatric): Secondary | ICD-10-CM | POA: Diagnosis not present

## 2015-12-08 ENCOUNTER — Ambulatory Visit: Payer: Self-pay | Admitting: Neurology

## 2015-12-08 ENCOUNTER — Telehealth: Payer: Self-pay

## 2015-12-08 NOTE — Telephone Encounter (Signed)
Pt did not show for their appt with Dr. Dohmeier today.  

## 2015-12-09 ENCOUNTER — Encounter: Payer: Self-pay | Admitting: Neurology

## 2015-12-31 DIAGNOSIS — G4733 Obstructive sleep apnea (adult) (pediatric): Secondary | ICD-10-CM | POA: Diagnosis not present

## 2016-01-15 ENCOUNTER — Encounter: Payer: Self-pay | Admitting: Neurology

## 2016-01-15 ENCOUNTER — Ambulatory Visit (INDEPENDENT_AMBULATORY_CARE_PROVIDER_SITE_OTHER): Payer: BLUE CROSS/BLUE SHIELD | Admitting: Neurology

## 2016-01-15 VITALS — BP 130/82 | HR 88 | Resp 20 | Ht 69.0 in | Wt 236.0 lb

## 2016-01-15 DIAGNOSIS — G4726 Circadian rhythm sleep disorder, shift work type: Secondary | ICD-10-CM

## 2016-01-15 DIAGNOSIS — Z9989 Dependence on other enabling machines and devices: Principal | ICD-10-CM

## 2016-01-15 DIAGNOSIS — G4733 Obstructive sleep apnea (adult) (pediatric): Secondary | ICD-10-CM | POA: Diagnosis not present

## 2016-01-15 NOTE — Progress Notes (Signed)
SLEEP MEDICINE CLINIC   Provider:  Larey Seat, M D  Referring Provider: No ref. provider found Primary Care Physician:    Chief Complaint  Patient presents with  . Follow-up    cpap going well, pt does not want to use his cpap all the time    HPI:  Dominic [name] is a 40 y.o. male , seen here as a referral from [place],   for a sleep evaluation,   Chief complaint according to patient : " I snore loudly ".  Dominic [name] has been known to snore loudly and his wife has noted him to stp breathing, too. He usually likes to sleep on his side, but when he resumes a supine position he snores and has apnea. He is a day daytime worker, works outdoors. He lives in [place] for several years and has suffered allergic rhinitis most of the year. He recently had elevated BP , but is not concerned.  Sleep habits are as follows: He usually goes to bed at 10 PM and he is asleep quickly,  No nocturia no other arousals, He wakes in AM at 5 AM , he takes coffee and oatmeal at home and commutes to work about 30 minutes.  He wakens with a parched mouth and reports no headaches. He usually feels as if he needs another hour of sleep. He described his bedroom as cool and dark, not quiet.    Sleep medical history and family sleep history: His 3 children all are mouth breathers, Social history: He is a non smoker, beer drinker on the weekends.   I had the pleasure of seeing Dominic [name] today on 01/15/2016 following a sleep study from March 17. He was diagnosed with an AHI of 35 when he slept on his back the AHI increased to 46.5 per hour and during REM sleep significantly to 67.8 per hour. He also had prolonged periods of low oxygen levels the SPO2 nadir was 65% and the total desaturation time 91.8 minutes. He had no periodic limb movements the study was split and he was titrated to 10 cm water CPAP with a residual AHI of 0.0. 2 cm flex function was added he was fitted with a ResMed nasal pillow. 10 in  large size. A dream station was issued for the patient.  I see Dominic [name] today also this data from his CPAP machine he has used the machine 80% of the days with a user time of 4 hours and 7 minutes an average AHI is now 0.8 ramp time is 10 minutes, starting pressure 5 cm water. 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 The nasal pillow fit 7 but he already has noted that this one is getting worn out. I will order it through the DME, [employer] - hopefully he can have it replaced within the next 2 weeks. No nocturia,     Review of Systems: Out of a complete 14 system review, the patient complains of only the following symptoms, and all other reviewed systems are negative. Snoring  alleviated.   Epworth score 8 Fatigue severity score 13  , depression score 0   Social History   Social History  . Marital Status: Married    Spouse Name: N/A  . Number of Children: N/A  . Years of Education: N/A   Occupational History  .  Demolition work   Social History Main Topics  . Smoking status: Former Smoker -- 0.25 packs/day for 3 years    Types: Cigarettes  . Smokeless tobacco: Never Used  . Alcohol Use: 0.0 oz/week    5-6 Cans of beer per week     Comment: per week  . Drug Use: No  . Sexual Activity: Not on file   Other Topics Concern  . Not on file   Social History Narrative   Marital status: ; from [place]; [place] 25 years ago.      Children:  4 children; no grandchildren      Tobacco: none    Family History  Problem Relation Age of Onset  . Hypertension Mother   . Diabetes Mother   . Diabetes Father     Past Medical History  Diagnosis Date  . Hyperlipidemia   . Hernia   . Allergy   . GERD (gastroesophageal reflux disease)     Past Surgical History  Procedure Laterality Date  . Inguinal hernia repair  2012    right  . Groin dissection   11/19/2011    Procedure: GROIN EXPLORATION;  Surgeon: Harl Bowie, MD;  Location: [facility];  Service: General;  Laterality: Right;  Right Groin Exploration  . Inguinal hernia repair  11/19/2011    Procedure: HERNIA REPAIR INGUINAL ADULT;  Surgeon: Harl Bowie, MD;  Location: [facility];  Service: General;  Laterality: Right;  excision of right groin mass    Current Outpatient Prescriptions  Medication Sig Dispense Refill  . fluticasone (FLONASE) 50 MCG/ACT nasal spray Place 2 sprays into both nostrils daily. Take two sprays in each nostril daily. 16 g 12  . loratadine (CLARITIN) 10 MG tablet Take 1 tablet (10 mg total) by mouth daily. 30 tablet 11   No current facility-administered medications for this visit.    Allergies as of 01/15/2016  . (No Known Allergies)    Vitals: BP 130/82 mmHg  Pulse 88  Resp 20  Ht 5\' 9"  (1.753 m)  Wt 236 lb (107.049 kg)  BMI 34.84 kg/m2 Last Weight:  Wt Readings from Last 1 Encounters:  01/15/16 236 lb (107.049 kg)   [id number] mass index is 34.84 kg/(m^2).     Last Height:   Ht Readings from Last 1 Encounters:  01/15/16 5\' 9"  (1.753 m)    Physical exam:  General: The patient is awake, alert and appears not in acute distress. The patient is well groomed. Head: Normocephalic, atraumatic. Neck is supple. Mallampati 3,  neck circumference ; 20.5 . Nasal airflow congested , TMJ is not  evident . Retrognathia is*seen.  Cardiovascular:  Regular rate and rhythm  without  murmurs or carotid bruit, and without distended neck veins. Respiratory: Lungs are clear to auscultation. Skin:  Without evidence of edema, or rash Trunk: BMI is elevated . The patient's posture is erect    Neurologic exam : The patient is awake and alert, oriented to place and time.   Memory subjective  described as intact.  Attention span & concentration ability appears normal.  Speech is fluent,  without  dysarthria, dysphonia or aphasia.  Mood and affect are  appropriate.  Cranial nerves: Pupils are equal and briskly reactive to light.  Extraocular movements  in vertical and horizontal planes intact and without nystagmus. Visual fields by finger perimetry are intact. Hearing to finger rub intact.     Facial motor strength is symmetric and tongue and uvula move midline. Shoulder shrug was symmetrical.  Motor exam:   Normal tone, muscle bulk and symmetric strength in all extremities.  The patient was advised of the nature of the diagnosed sleep disorder , the treatment options and risks for general a health and wellness arising from not treating the condition.  I spent more than 15 minutes of face to face time with the patient. Greater than 50% of time was spent in counseling and coordination of care. We have discussed the diagnosis and differential and I answered the patient's questions.    The patient has been working outdoors as a Writer , he is not daytime sleepy but his nocturnal sleep is not as restorative . Has neither morning headaches not nocturia. Witnessed are loud snoring and apneas.  His records form urgent care were reveiwed.    Assessment:  After physical and neurologic examination, review of laboratory studies,  Personal review of imaging studies, reports of other /same  Imaging studies ,  Results of polysomnography/ neurophysiology testing and pre-existing records as far as provided in visit., my assessment is   1) Dominic [name] was diagnosed with a moderately severe form of apnea, with strong REM sleep accentuation and supine positional accentuation. He had also prolonged hypoxemia periods. The current settings have corrected his apnea very well with a residual AHI of 0.8 and based on this I would just like to the patient to try to use the machine 15-20 minutes more per day if possible no settings have to be changed the patient has requested a new mask.   Plan:  Treatment plan and additional workup :  interface re- fitted.        Asencion Partridge Malaysha Arlen MD  01/15/2016   CC:

## 2016-01-30 DIAGNOSIS — G4733 Obstructive sleep apnea (adult) (pediatric): Secondary | ICD-10-CM | POA: Diagnosis not present

## 2016-05-19 DIAGNOSIS — G4733 Obstructive sleep apnea (adult) (pediatric): Secondary | ICD-10-CM | POA: Diagnosis not present

## 2016-07-27 ENCOUNTER — Other Ambulatory Visit: Payer: Self-pay | Admitting: Physician Assistant

## 2016-07-27 ENCOUNTER — Ambulatory Visit (INDEPENDENT_AMBULATORY_CARE_PROVIDER_SITE_OTHER): Payer: BLUE CROSS/BLUE SHIELD | Admitting: Physician Assistant

## 2016-07-27 VITALS — BP 124/86 | HR 75 | Temp 98.6°F | Resp 16 | Ht 69.0 in | Wt 255.0 lb

## 2016-07-27 DIAGNOSIS — Z1389 Encounter for screening for other disorder: Secondary | ICD-10-CM | POA: Diagnosis not present

## 2016-07-27 DIAGNOSIS — Z1329 Encounter for screening for other suspected endocrine disorder: Secondary | ICD-10-CM | POA: Diagnosis not present

## 2016-07-27 DIAGNOSIS — Z13220 Encounter for screening for lipoid disorders: Secondary | ICD-10-CM | POA: Diagnosis not present

## 2016-07-27 DIAGNOSIS — Z0000 Encounter for general adult medical examination without abnormal findings: Secondary | ICD-10-CM

## 2016-07-27 DIAGNOSIS — Z131 Encounter for screening for diabetes mellitus: Secondary | ICD-10-CM

## 2016-07-27 DIAGNOSIS — Z130 Encounter for screening for diseases of the blood and blood-forming organs and certain disorders involving the immune mechanism: Secondary | ICD-10-CM

## 2016-07-27 DIAGNOSIS — Z23 Encounter for immunization: Secondary | ICD-10-CM | POA: Diagnosis not present

## 2016-07-27 DIAGNOSIS — K219 Gastro-esophageal reflux disease without esophagitis: Secondary | ICD-10-CM

## 2016-07-27 DIAGNOSIS — Z114 Encounter for screening for human immunodeficiency virus [HIV]: Secondary | ICD-10-CM

## 2016-07-27 NOTE — Patient Instructions (Signed)
     IF you received an x-ray today, you will receive an invoice from [facility]. Please contact [facility] at [phone] with questions or concerns regarding your invoice.   IF you received labwork today, you will receive an invoice from [facility]. Please contact [facility] at [phone] with questions or concerns regarding your invoice.   Our billing staff will not be able to assist you with questions regarding bills from these companies.  You will be contacted with the lab results as soon as they are available. The fastest way to get your results is to activate your My Chart account. Instructions are located on the last page of this paperwork. If you have not heard from us regarding the results in 2 weeks, please contact this office.     

## 2016-07-27 NOTE — Progress Notes (Signed)
07/27/2016 8:29 AM   DOB: May 26, 1976 / MRN: [id number]  SUBJECTIVE:  Dominic [name] is a 41 y.o. male presenting for annual exam.  He is feeling well.  Tells me he has put on some weight over the holidays, however his goal this year is to cut alcohol completely which is about one beer daily. He would like a flu shot today.    Depression screen PHQ 2/9 07/27/2016  Decreased Interest 0  Down, Depressed, Hopeless 0  PHQ - 2 Score 0     Immunization History  Administered Date(s) Administered  . Influenza,inj,Quad PF,36+ Mos 05/31/2014, 06/06/2015, 07/27/2016     He has No Known Allergies.   He  has a past medical history of Allergy; GERD (gastroesophageal reflux disease); Hernia; and Hyperlipidemia.    He  reports that he has quit smoking. His smoking use included Cigarettes. He has a 0.75 pack-year smoking history. He has never used smokeless tobacco. He reports that he drinks alcohol. He reports that he does not use drugs. He  has no sexual activity history on file. The patient  has a past surgical history that includes Inguinal hernia repair (2012); Groin dissection (11/19/2011); and Inguinal hernia repair (11/19/2011).  His family history includes Diabetes in his father and mother; Hypertension in his mother.  Review of Systems  Constitutional: Negative for chills and fever.  Respiratory: Negative for cough and shortness of breath.   Cardiovascular: Negative for chest pain and leg swelling.  Gastrointestinal: Negative for nausea.  Skin: Negative for itching and rash.  Neurological: Negative for dizziness.    The problem list and medications were reviewed and updated by myself where necessary and exist elsewhere in the encounter.   OBJECTIVE:  BP 124/86   Pulse 75   Temp 98.6 F (37 C) (Oral)   Resp 16   Ht '5\' 9"'  (1.753 m)   Wt 255 lb (115.7 kg)   SpO2 96%   BMI 37.66 kg/m   Physical Exam  Constitutional: He is oriented to person, place, and time. He appears  well-developed. He does not appear ill.  Eyes: Conjunctivae and EOM are normal. Pupils are equal, round, and reactive to light.  Cardiovascular: Normal rate, regular rhythm and normal heart sounds.   Pulmonary/Chest: Effort normal and breath sounds normal.  Abdominal: He exhibits no distension.  Musculoskeletal: Normal range of motion.  Neurological: He is alert and oriented to person, place, and time. He displays normal reflexes. No cranial nerve deficit. Coordination normal.  Skin: Skin is warm and dry. He is not diaphoretic.  Psychiatric: He has a normal mood and affect.  Nursing note and vitals reviewed.   No results found for this or any previous visit (from the past 72 hour(s)).  No results found.  [place] was seen today for annual exam.  Diagnoses and all orders for this visit:  Annual physical exam: Obese male with history of resolved prediabetes.  Will screen labs.  Immunizations up to date.  He will come back for any health concerns.   Screening for deficiency anemia -     CBC  Screening for nephropathy -     CMP14+EGFR  Screening for lipid disorders -     Lipid panel  Screening for thyroid disorder -     TSH  Screening for diabetes mellitus -     Hemoglobin A1c  Screening for HIV (human immunodeficiency virus) -     HIV antibody  Need for prophylactic vaccination and  inoculation against influenza -     Flu Vaccine QUAD 36+ mos IM -     Care order/instruction:    The patient is advised to call or return to clinic if he does not see an improvement in symptoms, or to seek the care of the closest emergency department if he worsens with the above plan.   Philis Fendt, MHS, PA-C [facility] 07/27/2016 8:29 AM

## 2016-07-27 NOTE — Telephone Encounter (Signed)
SS refill req Omeprazole Seen today for CPE - did not see mention of med.

## 2016-07-28 LAB — LIPID PANEL
CHOL/HDL RATIO: 5.4 ratio — AB (ref 0.0–5.0)
Cholesterol, Total: 199 mg/dL (ref 100–199)
HDL: 37 mg/dL — AB (ref >39)
LDL Calculated: 98 mg/dL (ref 0–99)
Triglycerides: 321 mg/dL — ABNORMAL HIGH (ref 0–149)
VLDL Cholesterol Cal: 64 mg/dL — ABNORMAL HIGH (ref 5–40)

## 2016-07-28 LAB — CBC
HEMATOCRIT: 50.1 % (ref 37.5–51.0)
Hemoglobin: 17.2 g/dL (ref 13.0–17.7)
MCH: 31.7 pg (ref 26.6–33.0)
MCHC: 34.3 g/dL (ref 31.5–35.7)
MCV: 92 fL (ref 79–97)
PLATELETS: 224 10*3/uL (ref 150–379)
RBC: 5.43 x10E6/uL (ref 4.14–5.80)
RDW: 13.3 % (ref 12.3–15.4)
WBC: 7.6 10*3/uL (ref 3.4–10.8)

## 2016-07-28 LAB — HEMOGLOBIN A1C
ESTIMATED AVERAGE GLUCOSE: 120 mg/dL
Hgb A1c MFr Bld: 5.8 % — ABNORMAL HIGH (ref 4.8–5.6)

## 2016-07-28 LAB — CMP14+EGFR
A/G RATIO: 1.7 (ref 1.2–2.2)
ALT: 47 IU/L — ABNORMAL HIGH (ref 0–44)
AST: 28 IU/L (ref 0–40)
Albumin: 4.7 g/dL (ref 3.5–5.5)
Alkaline Phosphatase: 86 IU/L (ref 39–117)
BILIRUBIN TOTAL: 1.2 mg/dL (ref 0.0–1.2)
BUN/Creatinine Ratio: 26 — ABNORMAL HIGH (ref 9–20)
BUN: 17 mg/dL (ref 6–24)
CHLORIDE: 100 mmol/L (ref 96–106)
CO2: 23 mmol/L (ref 18–29)
Calcium: 9.7 mg/dL (ref 8.7–10.2)
Creatinine, Ser: 0.66 mg/dL — ABNORMAL LOW (ref 0.76–1.27)
GFR, EST AFRICAN AMERICAN: 138 mL/min/{1.73_m2} (ref >59)
GFR, EST NON AFRICAN AMERICAN: 119 mL/min/{1.73_m2} (ref >59)
GLOBULIN, TOTAL: 2.8 g/dL (ref 1.5–4.5)
Glucose: 125 mg/dL — ABNORMAL HIGH (ref 65–99)
Potassium: 4.3 mmol/L (ref 3.5–5.2)
SODIUM: 140 mmol/L (ref 134–144)
Total Protein: 7.5 g/dL (ref 6.0–8.5)

## 2016-07-28 LAB — HIV ANTIBODY (ROUTINE TESTING W REFLEX): HIV SCREEN 4TH GENERATION: NONREACTIVE

## 2016-07-28 LAB — TSH: TSH: 3.15 u[IU]/mL (ref 0.450–4.500)

## 2016-10-12 ENCOUNTER — Telehealth: Payer: Self-pay | Admitting: General Practice

## 2016-10-12 NOTE — Telephone Encounter (Signed)
Pt is needing to get his allergy medication refilled but he is in [place] and would like for [name] to call it in to the [facility] [phone]

## 2016-10-14 NOTE — Telephone Encounter (Signed)
Left message medication was called in to [facility] [place] in [place]

## 2016-10-16 MED ORDER — FLUTICASONE PROPIONATE 50 MCG/ACT NA SUSP: 2.0000 | Freq: Every day | NASAL | 1 refills | Status: DC

## 2016-10-22 ENCOUNTER — Ambulatory Visit (INDEPENDENT_AMBULATORY_CARE_PROVIDER_SITE_OTHER): Payer: BLUE CROSS/BLUE SHIELD | Admitting: Physician Assistant

## 2016-10-22 VITALS — BP 133/86 | HR 60 | Temp 98.2°F | Resp 16 | Ht 69.0 in | Wt 244.8 lb

## 2016-10-22 DIAGNOSIS — J301 Allergic rhinitis due to pollen: Secondary | ICD-10-CM | POA: Diagnosis not present

## 2016-10-22 DIAGNOSIS — R7303 Prediabetes: Secondary | ICD-10-CM

## 2016-10-22 DIAGNOSIS — G4730 Sleep apnea, unspecified: Secondary | ICD-10-CM | POA: Diagnosis not present

## 2016-10-22 DIAGNOSIS — Z13220 Encounter for screening for lipoid disorders: Secondary | ICD-10-CM | POA: Diagnosis not present

## 2016-10-22 LAB — POCT GLYCOSYLATED HEMOGLOBIN (HGB A1C): HEMOGLOBIN A1C: 5.9

## 2016-10-22 MED ORDER — FLUTICASONE PROPIONATE 50 MCG/ACT NA SUSP: 2.0000 | Freq: Every day | NASAL | 1 refills | Status: DC

## 2016-10-22 NOTE — Patient Instructions (Addendum)
I'm proud of the work you are putting in for your health.     IF you received an x-ray today, you will receive an invoice from [facility]. Please contact [facility] at [phone] with questions or concerns regarding your invoice.   IF you received labwork today, you will receive an invoice from [facility]. Please contact [facility] at [phone] with questions or concerns regarding your invoice.   Our billing staff will not be able to assist you with questions regarding bills from these companies.  You will be contacted with the lab results as soon as they are available. The fastest way to get your results is to activate your My Chart account. Instructions are located on the last page of this paperwork. If you have not heard from [name] regarding the results in 2 weeks, please contact this office.

## 2016-10-22 NOTE — Progress Notes (Signed)
10/22/2016 9:04 AM   DOB: 12/06/1975 / MRN: [id number]  SUBJECTIVE:  Dominic [name] is a 41 y.o. male presenting for A1c recheck and seasonal allergies. He wants   He has a long history of allergies.  Tells me he wants a dpo shot today to prevent his allergies.  He is taking Claritin and Flonase with good relief of his symptoms. Denies sneezing, eye itching, ear itching, cough, sore throat today. o Wears his CPAP nightly and is sleeping well. He has lost 11 lbs since his last visit. He is has tried to cut beer to once or twice every few weeks.  He is also exercising daily on the TM.   There are no preventive care reminders to display for this patient.   He has No Known Allergies.   He  has a past medical history of Allergy; GERD (gastroesophageal reflux disease); Hernia; and Hyperlipidemia.    He  reports that he has quit smoking. His smoking use included Cigarettes. He has a 0.75 pack-year smoking history. He has never used smokeless tobacco. He reports that he drinks alcohol. He reports that he does not use drugs. He  has no sexual activity history on file. The patient  has a past surgical history that includes Inguinal hernia repair (2012); Groin dissection (11/19/2011); and Inguinal hernia repair (11/19/2011).  His family history includes Diabetes in his father and mother; Hypertension in his mother.  Review of Systems  Constitutional: Negative for chills and fever.  HENT: Negative for sore throat.   Respiratory: Negative for cough and shortness of breath.   Cardiovascular: Negative for leg swelling.  Gastrointestinal: Negative for abdominal pain and heartburn.  Skin: Negative for itching and rash.  Neurological: Negative for dizziness and headaches.    The problem list and medications were reviewed and updated by myself where necessary and exist elsewhere in the encounter.   OBJECTIVE:  BP 133/86   Pulse 60   Temp 98.2 F (36.8 C) (Oral)   Resp 16   Ht 5\' 9"  (1.753 m)   Wt  244 lb 12.8 oz (111 kg)   SpO2 97%   BMI 36.15 kg/m   Physical Exam  Constitutional: He is oriented to person, place, and time.  HENT:  Right Ear: Hearing, tympanic membrane, external ear and ear canal normal.  Left Ear: Hearing, tympanic membrane, external ear and ear canal normal.  Nose: Nose normal. Right sinus exhibits no maxillary sinus tenderness and no frontal sinus tenderness. Left sinus exhibits no maxillary sinus tenderness and no frontal sinus tenderness.  Mouth/Throat: Uvula is midline, oropharynx is clear and moist and mucous membranes are normal. No oropharyngeal exudate, posterior oropharyngeal edema or tonsillar abscesses.  Eyes: EOM are normal. Pupils are equal, round, and reactive to light.  Cardiovascular: Normal rate, regular rhythm, S1 normal, S2 normal and normal pulses.  Exam reveals no gallop and no friction rub.   No murmur heard. Pulmonary/Chest: Effort normal. No stridor. No respiratory distress. He has no wheezes. He has no rales.  Abdominal: He exhibits no distension.  Musculoskeletal: He exhibits no edema or tenderness.  Lymphadenopathy:       Head (right side): No submandibular and no tonsillar adenopathy present.       Head (left side): No submandibular and no tonsillar adenopathy present.    He has no cervical adenopathy.  Neurological: He is alert and oriented to person, place, and time. He has normal strength and normal reflexes. He is not disoriented. He displays no atrophy  and no tremor. No cranial nerve deficit. He exhibits normal muscle tone. He displays a negative Romberg sign. He displays no seizure activity. Coordination and gait normal.  Skin:     Psychiatric: His behavior is normal.    Lab Results  Component Value Date   HGBA1C 5.9 10/22/2016   Wt Readings from Last 3 Encounters:  10/22/16 244 lb 12.8 oz (111 kg)  07/27/16 255 lb (115.7 kg)  01/15/16 236 lb (107 kg)   Lab Results  Component Value Date   CHOL 199 07/27/2016   HDL 37  (L) 07/27/2016   LDLCALC 98 07/27/2016   TRIG 321 (H) 07/27/2016   CHOLHDL 5.4 (H) 07/27/2016      ASSESSMENT AND PLAN:  Dominic [name] was seen today for allergies.  Diagnoses and all orders for this visit:  Prediabetes: A1c 2/10 of a point.  I will see him back in 6 months and have given him the target of 220 lbs which I think he can do.   -     POCT glycosylated hemoglobin (Hb A1C)  Sleep apnea, unspecified type: Controlled.  CPAP compliant per patienrt.   Screening for lipid disorders -     Lipid panel  Chronic seasonal allergic rhinitis due to pollen -     fluticasone (FLONASE) 50 MCG/ACT nasal spray; Place 2 sprays into both nostrils daily. Take two sprays in each nostril daily.    The patient is advised to call or return to clinic if he does not see an improvement in symptoms, or to seek the care of the closest emergency department if he worsens with the above plan.   Philis Fendt, MHS, PA-C [facility] 10/22/2016 9:04 AM

## 2016-10-23 LAB — LIPID PANEL
CHOLESTEROL TOTAL: 174 mg/dL (ref 100–199)
Chol/HDL Ratio: 4.4 ratio (ref 0.0–5.0)
HDL: 40 mg/dL (ref >39)
LDL Calculated: 106 mg/dL — ABNORMAL HIGH (ref 0–99)
TRIGLYCERIDES: 141 mg/dL (ref 0–149)
VLDL CHOLESTEROL CAL: 28 mg/dL (ref 5–40)

## 2017-06-17 DIAGNOSIS — H524 Presbyopia: Secondary | ICD-10-CM | POA: Diagnosis not present

## 2017-07-15 DIAGNOSIS — M5417 Radiculopathy, lumbosacral region: Secondary | ICD-10-CM | POA: Diagnosis not present

## 2017-07-15 DIAGNOSIS — M546 Pain in thoracic spine: Secondary | ICD-10-CM | POA: Diagnosis not present

## 2017-07-15 DIAGNOSIS — M5413 Radiculopathy, cervicothoracic region: Secondary | ICD-10-CM | POA: Diagnosis not present

## 2017-07-15 DIAGNOSIS — M7912 Myalgia of auxiliary muscles, head and neck: Secondary | ICD-10-CM | POA: Diagnosis not present

## 2017-07-21 DIAGNOSIS — M7912 Myalgia of auxiliary muscles, head and neck: Secondary | ICD-10-CM | POA: Diagnosis not present

## 2017-07-21 DIAGNOSIS — M546 Pain in thoracic spine: Secondary | ICD-10-CM | POA: Diagnosis not present

## 2017-07-21 DIAGNOSIS — M5417 Radiculopathy, lumbosacral region: Secondary | ICD-10-CM | POA: Diagnosis not present

## 2017-07-21 DIAGNOSIS — M5413 Radiculopathy, cervicothoracic region: Secondary | ICD-10-CM | POA: Diagnosis not present

## 2017-07-28 DIAGNOSIS — M5413 Radiculopathy, cervicothoracic region: Secondary | ICD-10-CM | POA: Diagnosis not present

## 2017-07-28 DIAGNOSIS — M546 Pain in thoracic spine: Secondary | ICD-10-CM | POA: Diagnosis not present

## 2017-07-28 DIAGNOSIS — M5417 Radiculopathy, lumbosacral region: Secondary | ICD-10-CM | POA: Diagnosis not present

## 2017-07-28 DIAGNOSIS — M7912 Myalgia of auxiliary muscles, head and neck: Secondary | ICD-10-CM | POA: Diagnosis not present

## 2017-08-20 ENCOUNTER — Other Ambulatory Visit: Payer: Self-pay | Admitting: Physician Assistant

## 2017-08-20 DIAGNOSIS — K219 Gastro-esophageal reflux disease without esophagitis: Secondary | ICD-10-CM

## 2017-10-07 ENCOUNTER — Ambulatory Visit: Payer: BLUE CROSS/BLUE SHIELD | Admitting: Physician Assistant

## 2017-10-07 ENCOUNTER — Other Ambulatory Visit: Payer: Self-pay

## 2017-10-07 ENCOUNTER — Encounter: Payer: Self-pay | Admitting: Physician Assistant

## 2017-10-07 VITALS — BP 145/88 | HR 69 | Temp 98.5°F | Resp 18 | Ht 69.0 in | Wt 267.8 lb

## 2017-10-07 DIAGNOSIS — J301 Allergic rhinitis due to pollen: Secondary | ICD-10-CM

## 2017-10-07 DIAGNOSIS — R030 Elevated blood-pressure reading, without diagnosis of hypertension: Secondary | ICD-10-CM

## 2017-10-07 DIAGNOSIS — K219 Gastro-esophageal reflux disease without esophagitis: Secondary | ICD-10-CM | POA: Diagnosis not present

## 2017-10-07 LAB — CBC WITH DIFFERENTIAL/PLATELET
BASOS: 0 %
Basophils Absolute: 0 10*3/uL (ref 0.0–0.2)
EOS (ABSOLUTE): 0.4 10*3/uL (ref 0.0–0.4)
Eos: 5 %
Hematocrit: 47.8 % (ref 37.5–51.0)
Hemoglobin: 16.6 g/dL (ref 13.0–17.7)
IMMATURE GRANS (ABS): 0 10*3/uL (ref 0.0–0.1)
Immature Granulocytes: 0 %
LYMPHS: 35 %
Lymphocytes Absolute: 2.6 10*3/uL (ref 0.7–3.1)
MCH: 32.5 pg (ref 26.6–33.0)
MCHC: 34.7 g/dL (ref 31.5–35.7)
MCV: 94 fL (ref 79–97)
MONOS ABS: 0.5 10*3/uL (ref 0.1–0.9)
Monocytes: 6 %
NEUTROS ABS: 3.9 10*3/uL (ref 1.4–7.0)
Neutrophils: 54 %
PLATELETS: 214 10*3/uL (ref 150–379)
RBC: 5.1 x10E6/uL (ref 4.14–5.80)
RDW: 13.1 % (ref 12.3–15.4)
WBC: 7.4 10*3/uL (ref 3.4–10.8)

## 2017-10-07 LAB — HEMOGLOBIN A1C
ESTIMATED AVERAGE GLUCOSE: 151 mg/dL
Hgb A1c MFr Bld: 6.9 % — ABNORMAL HIGH (ref 4.8–5.6)

## 2017-10-07 LAB — CMP14+EGFR
A/G RATIO: 1.3 (ref 1.2–2.2)
ALT: 96 IU/L — AB (ref 0–44)
AST: 57 IU/L — ABNORMAL HIGH (ref 0–40)
Albumin: 4.2 g/dL (ref 3.5–5.5)
Alkaline Phosphatase: 92 IU/L (ref 39–117)
BILIRUBIN TOTAL: 1.1 mg/dL (ref 0.0–1.2)
BUN / CREAT RATIO: 18 (ref 9–20)
BUN: 12 mg/dL (ref 6–24)
CHLORIDE: 104 mmol/L (ref 96–106)
CO2: 21 mmol/L (ref 20–29)
Calcium: 9.4 mg/dL (ref 8.7–10.2)
Creatinine, Ser: 0.67 mg/dL — ABNORMAL LOW (ref 0.76–1.27)
GFR calc Af Amer: 136 mL/min/{1.73_m2} (ref >59)
GFR calc non Af Amer: 118 mL/min/{1.73_m2} (ref >59)
Globulin, Total: 3.2 g/dL (ref 1.5–4.5)
Glucose: 171 mg/dL — ABNORMAL HIGH (ref 65–99)
POTASSIUM: 4.1 mmol/L (ref 3.5–5.2)
Sodium: 140 mmol/L (ref 134–144)
TOTAL PROTEIN: 7.4 g/dL (ref 6.0–8.5)

## 2017-10-07 MED ORDER — LORATADINE 10 MG PO TABS
10.0000 mg | ORAL_TABLET | Freq: Every day | ORAL | 3 refills | Status: DC
Start: 2017-10-07 — End: 2018-09-19

## 2017-10-07 MED ORDER — FLUTICASONE PROPIONATE 50 MCG/ACT NA SUSP: 2.0000 | Freq: Every day | NASAL | 1 refills | Status: DC

## 2017-10-07 MED ORDER — OMEPRAZOLE 20 MG PO CPDR: 20.0000 mg | DELAYED_RELEASE_CAPSULE | Freq: Every day | ORAL | 3 refills | Status: DC

## 2017-10-07 NOTE — Patient Instructions (Signed)
     IF you received an x-ray today, you will receive an invoice from [facility]. Please contact [facility] at [phone] with questions or concerns regarding your invoice.   IF you received labwork today, you will receive an invoice from [facility]. Please contact [facility] at [phone] with questions or concerns regarding your invoice.   Our billing staff will not be able to assist you with questions regarding bills from these companies.  You will be contacted with the lab results as soon as they are available. The fastest way to get your results is to activate your My Chart account. Instructions are located on the last page of this paperwork. If you have not heard from us regarding the results in 2 weeks, please contact this office.     

## 2017-10-07 NOTE — Progress Notes (Signed)
10/07/2017 11:17 AM   DOB: [birth year] / MRN: [id number]  SUBJECTIVE:  Dominic [name] is a 42 y.o. male presenting for chronic medication refills.  Patient has history of prediabetes he has gained 30 pounds since the last visit I had with him.  He is taking omeprazole daily for GERD and this is working well for him.  Denies dysphagia, odynophagia.  Takes Flonase and Claritin and allergy season starts and this works well for him.  He has No Known Allergies.   He  has a past medical history of Allergy, GERD (gastroesophageal reflux disease), Hernia, and Hyperlipidemia.    He  reports that he has quit smoking. His smoking use included cigarettes. He has a 0.75 pack-year smoking history. he has never used smokeless tobacco. He reports that he drinks alcohol. He reports that he does not use drugs. He  has no sexual activity history on file. The patient  has a past surgical history that includes Inguinal hernia repair (2012); Groin dissection (11/19/2011); and Inguinal hernia repair (11/19/2011).  His family history includes Diabetes in his father and mother; Hypertension in his mother.  Review of Systems  Constitutional: Negative for chills, diaphoresis and fever.  Eyes: Negative.   Respiratory: Negative for cough, hemoptysis, sputum production, shortness of breath and wheezing.   Cardiovascular: Negative for chest pain, orthopnea and leg swelling.  Gastrointestinal: Negative for abdominal pain, blood in stool, constipation, diarrhea, heartburn, melena, nausea and vomiting.  Genitourinary: Negative for dysuria, flank pain, frequency, hematuria and urgency.  Skin: Negative for rash.  Neurological: Negative for dizziness, sensory change, speech change, focal weakness and headaches.    The problem list and medications were reviewed and updated by myself where necessary and exist elsewhere in the encounter.   OBJECTIVE:  BP (!) 145/88 (BP Location: Right Arm, Patient Position: Sitting, Cuff  Size: Large)   Pulse 69   Temp 98.5 F (36.9 C) (Oral)   Resp 18   Ht '5\' 9"'  (1.753 m)   Wt 267 lb 12.8 oz (121.5 kg)   SpO2 96%   BMI 39.55 kg/m   Physical Exam  Constitutional: He appears well-developed. He is active and cooperative.  Non-toxic appearance.  Cardiovascular: Normal rate, regular rhythm, S1 normal, S2 normal, normal heart sounds, intact distal pulses and normal pulses. Exam reveals no gallop and no friction rub.  No murmur heard. Pulmonary/Chest: Effort normal. No stridor. No tachypnea. No respiratory distress. He has no wheezes. He has no rales.  Abdominal: He exhibits no distension.  Musculoskeletal: He exhibits no edema.  Neurological: He is alert.  Skin: Skin is warm and dry. He is not diaphoretic. No pallor.  Vitals reviewed.   BP Readings from Last 3 Encounters:  10/07/17 (!) 145/88  10/22/16 133/86  07/27/16 124/86   Lab Results  Component Value Date   HGBA1C 5.9 10/22/2016   Wt Readings from Last 3 Encounters:  10/07/17 267 lb 12.8 oz (121.5 kg)  10/22/16 244 lb 12.8 oz (111 kg)  07/27/16 255 lb (115.7 kg)     No results found for this or any previous visit (from the past 55 hour(s)).  No results found.  ASSESSMENT AND PLAN:  [place] was seen today for medication refill.  Diagnoses and all orders for this visit:  Gastroesophageal reflux disease, esophagitis presence not specified -     omeprazole (PRILOSEC) 20 MG capsule; Take 1 capsule (20 mg total) by mouth daily. Take thirty minutes before the first meal of the day.  Elevated BP without diagnosis of hypertension -     Hemoglobin A1c -     CBC with Differential -     CMP14+EGFR  Chronic seasonal allergic rhinitis due to pollen -     fluticasone (FLONASE) 50 MCG/ACT nasal spray; Place 2 sprays into both nostrils daily.  Allergic rhinitis due to pollen -     loratadine (CLARITIN) 10 MG tablet; Take 1 tablet (10 mg total) by mouth daily.    The patient is advised to call or return to  clinic if he does not see an improvement in symptoms, or to seek the care of the closest emergency department if he worsens with the above plan.   Philis Fendt, MHS, PA-C [facility] 10/07/2017 11:17 AM

## 2017-10-10 ENCOUNTER — Encounter: Payer: Self-pay | Admitting: Physician Assistant

## 2017-10-11 ENCOUNTER — Encounter: Payer: Self-pay | Admitting: Physician Assistant

## 2017-10-12 ENCOUNTER — Encounter: Payer: Self-pay | Admitting: Physician Assistant

## 2017-10-13 NOTE — Telephone Encounter (Signed)
From previous patient email, patient asks, "Just a question if I have the test again on an empty stomach will make a difference or not  I have an appointment with you on April 19 at 8am"  Provider, will you want fasting labs at that time? Please respond to patient.

## 2017-10-19 ENCOUNTER — Encounter: Payer: Self-pay | Admitting: Physician Assistant

## 2017-11-04 ENCOUNTER — Ambulatory Visit: Payer: BLUE CROSS/BLUE SHIELD | Admitting: Physician Assistant

## 2017-11-11 ENCOUNTER — Ambulatory Visit: Payer: BLUE CROSS/BLUE SHIELD | Admitting: Physician Assistant

## 2017-11-11 ENCOUNTER — Ambulatory Visit (INDEPENDENT_AMBULATORY_CARE_PROVIDER_SITE_OTHER): Payer: BLUE CROSS/BLUE SHIELD | Admitting: Physician Assistant

## 2017-11-11 ENCOUNTER — Encounter: Payer: Self-pay | Admitting: Physician Assistant

## 2017-11-11 ENCOUNTER — Other Ambulatory Visit: Payer: Self-pay

## 2017-11-11 VITALS — BP 118/78 | HR 62 | Temp 97.8°F | Resp 18 | Ht 69.0 in | Wt 251.6 lb

## 2017-11-11 DIAGNOSIS — E119 Type 2 diabetes mellitus without complications: Secondary | ICD-10-CM | POA: Diagnosis not present

## 2017-11-11 LAB — POCT GLYCOSYLATED HEMOGLOBIN (HGB A1C): Hemoglobin A1C: 6.5

## 2017-11-11 NOTE — Patient Instructions (Addendum)
Your your A1c is down from diabetic status to prediabetic status.  Keep up the good work I will see you back in 2 months.  I like for you to weigh at least 10 pounds lighter by that time.    IF you received an x-ray today, you will receive an invoice from [facility]. Please contact [facility] at [phone] with questions or concerns regarding your invoice.   IF you received labwork today, you will receive an invoice from [place]. Please contact [facility] at [phone] with questions or concerns regarding your invoice.   Our billing staff will not be able to assist you with questions regarding bills from these companies.  You will be contacted with the lab results as soon as they are available. The fastest way to get your results is to activate your My Chart account. Instructions are located on the last page of this paperwork. If you have not heard from [name] regarding the results in 2 weeks, please contact this office.

## 2017-11-11 NOTE — Progress Notes (Signed)
11/11/2017 9:15 AM   DOB: [birth year] / MRN: [id number]  SUBJECTIVE:  Dominic [name] is a 42 y.o. male presenting for recheck A1c.  Patient has miraculously lost 16 pounds in 1 month and 4 days.  Is been able to do this before when threatened with the diagnosis of diabetes and medications.  He has No Known Allergies.   He  has a past medical history of Allergy, GERD (gastroesophageal reflux disease), Hernia, and Hyperlipidemia.    He  reports that he has quit smoking. His smoking use included cigarettes. He has a 0.75 pack-year smoking history. He has never used smokeless tobacco. He reports that he drinks alcohol. He reports that he does not use drugs. He  has no sexual activity history on file. The patient  has a past surgical history that includes Inguinal hernia repair (2012); Groin dissection (11/19/2011); and Inguinal hernia repair (11/19/2011).  His family history includes Diabetes in his father and mother; Hypertension in his mother.  Review of Systems  Constitutional: Negative for chills, diaphoresis and fever.  Respiratory: Negative for cough, hemoptysis, sputum production, shortness of breath and wheezing.   Cardiovascular: Negative for chest pain, orthopnea and leg swelling.  Gastrointestinal: Negative for nausea.  Skin: Negative for rash.  Neurological: Negative for dizziness.    The problem list and medications were reviewed and updated by myself where necessary and exist elsewhere in the encounter.   OBJECTIVE:  BP 118/78 (BP Location: Left Arm, Patient Position: Sitting, Cuff Size: Large)   Pulse 62   Temp 97.8 F (36.6 C) (Oral)   Resp 18   Ht 5\' 9"  (1.753 m)   Wt 251 lb 9.6 oz (114.1 kg)   SpO2 96%   BMI 37.15 kg/m   Wt Readings from Last 3 Encounters:  11/11/17 251 lb 9.6 oz (114.1 kg)  10/07/17 267 lb 12.8 oz (121.5 kg)  10/22/16 244 lb 12.8 oz (111 kg)     Physical Exam  Constitutional: He is oriented to person, place, and time. He appears  well-developed. He does not appear ill.  Eyes: Pupils are equal, round, and reactive to light. Conjunctivae and EOM are normal.  Cardiovascular: Normal rate, regular rhythm, S1 normal, S2 normal, normal heart sounds, intact distal pulses and normal pulses. Exam reveals no gallop and no friction rub.  No murmur heard. Pulmonary/Chest: Effort normal. No stridor. No respiratory distress. He has no wheezes. He has no rales.  Abdominal: He exhibits no distension.  Musculoskeletal: Normal range of motion. He exhibits no edema.  Neurological: He is alert and oriented to person, place, and time. No cranial nerve deficit. Coordination normal.  Skin: Skin is warm and dry. He is not diaphoretic.  Psychiatric: He has a normal mood and affect.  Nursing note and vitals reviewed.   Results for orders placed or performed in visit on 11/11/17 (from the past 72 hour(s))  POCT glycosylated hemoglobin (Hb A1C)     Status: None   Collection Time: 11/11/17  8:57 AM  Result Value Ref Range   Hemoglobin A1C 6.5     No results found.  ASSESSMENT AND PLAN:  Dominic [name] was seen today for prediabetes and follow-up.  Diagnoses and all orders for this visit:  Type 2 diabetes mellitus without complication, without long-term current use of insulin [facility]): Patient A1c dropping 4/10 of a point in about 1 month.  He is got a continue this path.  I think where we went wrong previously was a lack of follow-up  and accountability.  Of advised to lose another 10 pounds and I will see him back in 2 months for scheduled follow-up.  He is amenable to the plan. -     POCT glycosylated hemoglobin (Hb A1C)    The patient is advised to call or return to clinic if he does not see an improvement in symptoms, or to seek the care of the closest emergency department if he worsens with the above plan.   Philis Fendt, MHS, PA-C [facility] 11/11/2017 9:15 AM

## 2017-12-30 ENCOUNTER — Ambulatory Visit (INDEPENDENT_AMBULATORY_CARE_PROVIDER_SITE_OTHER): Payer: BLUE CROSS/BLUE SHIELD | Admitting: Physician Assistant

## 2017-12-30 ENCOUNTER — Encounter: Payer: Self-pay | Admitting: Physician Assistant

## 2017-12-30 ENCOUNTER — Other Ambulatory Visit: Payer: Self-pay

## 2017-12-30 VITALS — BP 128/90 | HR 70 | Temp 98.7°F | Resp 16 | Ht 68.0 in | Wt 250.0 lb

## 2017-12-30 DIAGNOSIS — R7303 Prediabetes: Secondary | ICD-10-CM | POA: Diagnosis not present

## 2017-12-30 DIAGNOSIS — E119 Type 2 diabetes mellitus without complications: Secondary | ICD-10-CM | POA: Diagnosis not present

## 2017-12-30 LAB — POCT GLYCOSYLATED HEMOGLOBIN (HGB A1C): Hemoglobin A1C: 5.6 % (ref 4.0–5.6)

## 2017-12-30 NOTE — Progress Notes (Signed)
    12/30/2017 11:42 AM   DOB: [birth year] / MRN: [id number]  SUBJECTIVE:  Dominic [name] is a 42 y.o. male presenting for diabetes recheck.  He is lost about 30 pounds since the beginning of the year.  Feels well.  Denies stocking glove paresthesia, polyuria polydipsia.  He is a former smoker but otherwise has no comorbidities.  He has No Known Allergies.   He  has a past medical history of Allergy, GERD (gastroesophageal reflux disease), Hernia, and Hyperlipidemia.    He  reports that he has quit smoking. His smoking use included cigarettes. He has a 0.75 pack-year smoking history. He has never used smokeless tobacco. He reports that he drank alcohol. He reports that he does not use drugs. He  has no sexual activity history on file. The patient  has a past surgical history that includes Inguinal hernia repair (2012); Groin dissection (11/19/2011); and Inguinal hernia repair (11/19/2011).  His family history includes Diabetes in his father and mother; Hypertension in his mother.  ROS per HPI  The problem list and medications were reviewed and updated by myself where necessary and exist elsewhere in the encounter.   OBJECTIVE:  BP 128/90   Pulse 70   Temp 98.7 F (37.1 C) (Oral)   Resp 16   Ht 5\' 8"  (1.727 m)   Wt 250 lb (113.4 kg)   SpO2 95%   BMI 38.01 kg/m   Wt Readings from Last 3 Encounters:  12/30/17 250 lb (113.4 kg)  11/11/17 251 lb 9.6 oz (114.1 kg)  10/07/17 267 lb 12.8 oz (121.5 kg)     Physical Exam  Constitutional: He is oriented to person, place, and time. He appears well-developed. He is active.  Non-toxic appearance. He does not appear ill.  Eyes: Pupils are equal, round, and reactive to light. Conjunctivae and EOM are normal.  Cardiovascular: Normal rate, regular rhythm, S1 normal, S2 normal, normal heart sounds, intact distal pulses and normal pulses. Exam reveals no gallop and no friction rub.  No murmur heard. Pulmonary/Chest: Effort normal. No stridor. No  respiratory distress. He has no wheezes. He has no rales.  Abdominal: He exhibits no distension.  Musculoskeletal: Normal range of motion. He exhibits no edema.  Neurological: He is alert and oriented to person, place, and time. He has normal strength and normal reflexes. He is not disoriented. No cranial nerve deficit or sensory deficit. He exhibits normal muscle tone. Coordination and gait normal.  Skin: Skin is warm and dry. He is not diaphoretic. No pallor.  Psychiatric: He has a normal mood and affect. His behavior is normal.  Nursing note and vitals reviewed.   No results found for this or any previous visit (from the past 72 hour(s)).  No results found.  ASSESSMENT AND PLAN:  Dominic [name] was seen today for diabetes.  Diagnoses and all orders for this visit:  Well controlled diabetes mellitus ([place]): Resolved.  See problem two.  -     POCT glycosylated hemoglobin (Hb A1C)  Prediabetes: Will check him back in three months. Continue lifestyle modification.     The patient is advised to call or return to clinic if he does not see an improvement in symptoms, or to seek the care of the closest emergency department if he worsens with the above plan.   Philis Fendt, MHS, PA-C [facility] 12/30/2017 11:42 AM

## 2018-01-09 ENCOUNTER — Ambulatory Visit: Payer: BLUE CROSS/BLUE SHIELD | Admitting: Physician Assistant

## 2018-04-01 ENCOUNTER — Ambulatory Visit: Payer: BLUE CROSS/BLUE SHIELD | Admitting: Physician Assistant

## 2018-04-21 ENCOUNTER — Other Ambulatory Visit: Payer: Self-pay

## 2018-04-21 ENCOUNTER — Ambulatory Visit (INDEPENDENT_AMBULATORY_CARE_PROVIDER_SITE_OTHER): Payer: BLUE CROSS/BLUE SHIELD | Admitting: Emergency Medicine

## 2018-04-21 ENCOUNTER — Encounter: Payer: Self-pay | Admitting: Emergency Medicine

## 2018-04-21 VITALS — BP 119/80 | HR 57 | Temp 99.3°F | Resp 16 | Ht 68.0 in | Wt 252.4 lb

## 2018-04-21 DIAGNOSIS — E1165 Type 2 diabetes mellitus with hyperglycemia: Secondary | ICD-10-CM | POA: Diagnosis not present

## 2018-04-21 DIAGNOSIS — Z23 Encounter for immunization: Secondary | ICD-10-CM | POA: Diagnosis not present

## 2018-04-21 DIAGNOSIS — E1169 Type 2 diabetes mellitus with other specified complication: Secondary | ICD-10-CM | POA: Insufficient documentation

## 2018-04-21 DIAGNOSIS — N481 Balanitis: Secondary | ICD-10-CM | POA: Diagnosis not present

## 2018-04-21 LAB — POCT GLYCOSYLATED HEMOGLOBIN (HGB A1C): HEMOGLOBIN A1C: 6.2 % — AB (ref 4.0–5.6)

## 2018-04-21 LAB — GLUCOSE, POCT (MANUAL RESULT ENTRY): POC Glucose: 124 mg/dl — AB (ref 70–99)

## 2018-04-21 MED ORDER — METFORMIN HCL 500 MG PO TABS: 500.0000 mg | ORAL_TABLET | Freq: Two times a day (BID) | ORAL | 3 refills | Status: DC

## 2018-04-21 MED ORDER — CLOTRIMAZOLE-BETAMETHASONE 1-0.05 % EX CREA: 1.0000 "application " | TOPICAL_CREAM | Freq: Two times a day (BID) | CUTANEOUS | 1 refills | Status: AC

## 2018-04-21 NOTE — Patient Instructions (Addendum)
If you have lab work done today you will be contacted with your lab results within the next 2 weeks.  If you have not heard from [name] then please contact us. The fastest way to get your results is to register for My Chart.   IF you received an x-ray today, you will receive an invoice from [facility]. Please contact [facility] at [phone] with questions or concerns regarding your invoice.   IF you received labwork today, you will receive an invoice from [place]. Please contact [facility] at [phone] with questions or concerns regarding your invoice.   Our billing staff will not be able to assist you with questions regarding bills from these companies.  You will be contacted with the lab results as soon as they are available. The fastest way to get your results is to activate your My Chart account. Instructions are located on the last page of this paperwork. If you have not heard from [name] regarding the results in 2 weeks, please contact this office.    Balanitis (Balanitis) La balanitis es la inflamacin de la cabeza del pene (glande). CAUSAS Puede tener mltiples causas, tanto infecciosas como no infecciosas. Con frecuencia, la balanitis es el resultado de una higiene personal deficiente, especialmente en los hombres no circuncidados. Sin una higiene [place], los virus, las bacterias y los hongos se acumulan entre el prepucio y el glande. Esto puede ocasionar una infeccin. La falta de aire y la irritacin debido a la secrecin normal llamada esmegma contribuyen al problema en los hombres no circuncidados. Otras causas son:  Irritacin qumica por el uso de algunos jabones y geles de ducha (especialmente jabones con perfume), condones, lubricantes personales, vaselina, espermicidas y acondicionadores de telas.  Enfermedades de la piel, como el eczema, la dermatitis y la psoriasis.  Alergias a algunos frmacos, como tetraciclina y sulfas.  Algunas enfermedades  como la cirrosis heptica, insuficiencia cardaca congestiva y enfermedades renales.  Obesidad mrbida. FACTORES DE RIESGO  Diabetes mellitus.  Un prepucio apretado que es difcil de tirar hacia atrs hasta pasar el glande (fimosis).  Tener relaciones sexuales sin usar un condn.  [place] sntomas pueden ser:  Secrecin que proviene de la zona debajo del prepucio.  Sensibilidad.  Picazn e imposibilidad para Visual merchandiser ereccin (debido al dolor).  Irritacin y erupcin cutnea.  Llagas en el glande y el prepucio. DIAGNSTICO El diagnstico de balanitis se realiza a travs de un examen fsico. TRATAMIENTO El tratamiento se basa en la causa. El tratamiento puede incluir:  Lavados frecuentes.  Mantener el glande y el prepucio secos.  El uso de medicamentos, como cremas, Educational psychologist, antibiticos o medicamentos para tratar infecciones por hongos.  Baos de asiento. Si la irritacin est originada en una cicatriz del prepucio que impide una retraccin fcil, se recomienda una circuncisin. INSTRUCCIONES PARA EL CUIDADO EN EL HOGAR  Debe evitar las relaciones sexuales hasta que la afeccin se haya mejorado.  ASEGRESE DE QUE:  Comprende estas instrucciones.  Controlar su afeccin.  Recibir ayuda de inmediato si no mejora o si empeora.  Esta informacin no tiene Marine scientist el consejo del mdico. Asegrese de hacerle al mdico cualquier pregunta que tenga. Document Released: 03/14/2013 Document Revised: 07/17/2013 Document Reviewed: 01/01/2013 Elsevier Interactive Patient Education  2017 [place].  Diabetes mellitus y nutricin Diabetes Mellitus and Nutrition Si sufre de diabetes (diabetes mellitus), es muy importante tener hbitos alimenticios saludables debido a que sus niveles de Dispensing optician (glucosa) se  ven afectados en gran medida por lo que come y bebe. [place] alimentos saludables en las cantidades [place], aproximadamente a  la [employer], [place] ayudar a:  Aeronautical engineer glucemia.  Disminuir el riesgo de sufrir una enfermedad cardaca.  Mejorar la presin arterial.  Science writer o mantener un peso saludable.  Todas las personas que sufren de diabetes son diferentes y cada una tiene necesidades diferentes en cuanto a un plan de alimentacin. El mdico puede recomendarle que trabaje con un especialista en dietas y nutricin (nutricionista) para Financial trader plan para usted. Su plan de alimentacin puede variar segn factores como:  Las caloras que necesita.  Los medicamentos que toma.  Su peso.  Sus niveles de glucemia, presin arterial y colesterol.  Su nivel de [place].  Otras afecciones que tenga, como enfermedades cardacas o renales.  Cmo me afectan los carbohidratos? Los carbohidratos afectan el nivel de glucemia ms que cualquier otro tipo de alimento. La ingesta de carbohidratos naturalmente aumenta la cantidad glucosa en la sangre. El recuento de carbohidratos es un mtodo destinado a Catering manager un registro de la cantidad de carbohidratos que se ingieren. El recuento de carbohidratos es importante para Theatre manager la glucemia a un nivel saludable, en especial si utiliza insulina o toma determinados medicamentos por va oral para la diabetes. Es importante saber la cantidad de carbohidratos que se pueden ingerir en cada comida sin correr Engineer, manufacturing. Esto es Psychologist, forensic. El nutricionista puede ayudarlo a calcular la cantidad de carbohidratos que debe ingerir en cada comida y colacin. Los alimentos que contienen carbohidratos incluyen:  Pan, cereal, arroz, pasta y galletas.  Papas y maz.  Guisantes, frijoles y lentejas.  Leche y Estate agent.  Lambert Mody y [place].  Postres, como pasteles, galletitas, helado y caramelos.  Cmo me afecta el alcohol? El alcohol puede provocar disminuciones sbitas de la glucemia (hipoglucemia), en especial si utiliza insulina o toma  determinados medicamentos por va oral para la diabetes. La hipoglucemia es una afeccin potencialmente mortal. Los sntomas de la hipoglucemia (somnolencia, mareos y confusin) son similares a los sntomas de haber consumido demasiado alcohol. Si el mdico afirma que el alcohol es seguro para usted, siga estas pautas:  Limite el consumo de alcohol a no ms de 1 medida por da si es mujer y no est Music therapist, y a 2 medidas si es hombre. Una medida equivale a 12oz (334ml) de cerveza, 5oz (157ml) de vino o 1oz (58ml) de bebidas de alta graduacin alcohlica.  No beba con el estmago vaco.  Mantngase hidratado con agua, gaseosas dietticas o t helado sin azcar.  Tenga en cuenta que las gaseosas comunes, los jugos y otros refrescos pueden contener mucha azcar y se deben contar como carbohidratos.  Consejos para seguir Photographer las etiquetas de los alimentos  Comience por controlar el tamao de la porcin en la etiqueta. La cantidad de caloras, carbohidratos, grasas y otros nutrientes mencionados en la etiqueta se basan en una porcin del alimento. Muchos alimentos contienen ms de una porcin por envase.  Verifique la cantidad total de gramos (g) de carbohidratos totales en una porcin. Puede calcular la cantidad de porciones de carbohidratos al dividir el total de carbohidratos por 15. Por ejemplo, si un alimento posee un total de 30g de carbohidratos, equivale a 2 porciones de carbohidratos.  Verifique la cantidad de gramos (g) de grasas saturadas y grasas trans en una porcin. Escoja alimentos que no contengan grasa o que tengan un bajo contenido.  Controle  la cantidad de miligramos (mg) de sodio en una porcin. La [employer] de las personas deben limitar la ingesta de sodio total a menos de 2300mg  por Training and development officer.  Siempre consulte la informacin nutricional de los alimentos etiquetados como "con bajo contenido de grasa" o "sin grasa". Estos alimentos pueden ser ms altos en azcar  agregada o en carbohidratos refinados y deben evitarse.  Hable con el nutricionista para identificar sus objetivos diarios en cuanto a los nutrientes mencionados en la etiqueta. De compras  Evite comprar alimentos procesados, enlatados o prehechos. Estos alimentos tienden a TEFL teacher cantidad de [place], sodio y azcar agregada.  Compre en la zona exterior de la tienda de comestibles. Esta incluye frutas y [employer], granos a granel, carnes frescas y productos lcteos frescos. Coccin  Utilice mtodos de coccin a baja temperatura, como hornear, en lugar de mtodos de coccin a alta temperatura, como frer en abundante aceite.  Cocine con aceites saludables, como el aceite de [place], canola o [place].  Evite cocinar con manteca, crema o carnes con alto contenido de grasa. Planificacin de las comidas  [employer] comidas y las colaciones de forma regular, preferentemente a la misma hora todos [place]. Evite pasar largos perodos de tiempo sin comer.  Consuma alimentos ricos en fibra, como frutas frescas, verduras, frijoles y cereales integrales. Consulte al nutricionista sobre cuntas porciones de carbohidratos puede consumir en cada comida.  Consuma entre 4 y 6 onzas de protenas magras por da, como carnes [place], pollo, pescado, [employer] o tofu. 1 onza equivale a 1 onza de carne, pollo o pescado, 1 huevo, o 1/4 taza de tofu.  Coma algunos alimentos por da que contengan grasas saludables, como aguacates, frutos secos, semillas y pescado. Estilo de vida   Controle su nivel de glucemia con regularidad.  Haga ejercicio al menos 34minutos, 5das o ms por semana, o como se lo haya indicado el mdico.  Tome los [employer] se lo haya indicado el mdico.  No consuma ningn producto que contenga nicotina o tabaco, como cigarrillos y Psychologist, sport and exercise. Si necesita ayuda para dejar de fumar, consulte al [employer] con un asesor o instructor en diabetes para  identificar estrategias para controlar el estrs y cualquier desafo emocional y social. Cules son algunas de las preguntas que puedo hacerle a mi mdico?  Es necesario que me rena con Radio broadcast assistant en diabetes?  Es necesario que me rena con un nutricionista?  A qu nmero puedo llamar si tengo preguntas?  Cules son los mejores momentos para controlar la glucemia? Dnde encontrar ms informacin:  Asociacin Americana de la Diabetes (American Diabetes Association): diabetes.org/food-and-fitness/food  Academia de Nutricin y Information systems manager (Academy of Nutrition and Dietetics): PokerClues.dk  [place] Diabetes y Drain y Water quality scientist [facility] of Diabetes and Digestive and Kidney Diseases) ([place], NIH): ContactWire.be Resumen  Un plan de alimentacin saludable lo ayudar a Aeronautical engineer glucemia y Theatre manager un estilo de vida saludable.  Trabajar con un especialista en dietas y nutricin (nutricionista) puede ayudarlo a Insurance claims handler de alimentacin para usted.  Tenga en cuenta que los carbohidratos y el alcohol tienen efectos inmediatos en sus niveles de glucemia. Es importante contar los carbohidratos y consumir alcohol con prudencia. Esta informacin no tiene Marine scientist el consejo del mdico. Asegrese de hacerle al mdico cualquier pregunta que tenga. Document Released: 10/19/2007 Document Revised: 11/01/2016 Document Reviewed: 11/01/2016 Elsevier Interactive Patient Education  2018 [employer].

## 2018-04-21 NOTE — Progress Notes (Signed)
Lab Results  Component Value Date   HGBA1C 5.6 12/30/2017   BP Readings from Last 3 Encounters:  04/21/18 119/80  12/30/17 128/90  11/11/17 118/78   Dominic [name] 42 y.o.   Chief Complaint  Patient presents with  . Diabetes    follow up blood sugar  . Rash    groin area with itching and redness x 6 weeks    HISTORY OF PRESENT ILLNESS: This is a 42 y.o. male with history of diabetes here for follow-up. Complaining of rash to the head of his penis. No other significant symptoms.  HPI   Prior to Admission medications   Medication Sig Start Date End Date Taking? Authorizing Provider  omeprazole (PRILOSEC) 20 MG capsule Take 1 capsule (20 mg total) by mouth daily. Take thirty minutes before the first meal of the day. 10/07/17  Yes Tereasa Coop, PA-C  fluticasone (FLONASE) 50 MCG/ACT nasal spray Place 2 sprays into both nostrils daily. Patient not taking: Reported on 04/21/2018 10/07/17   Tereasa Coop, PA-C  loratadine (CLARITIN) 10 MG tablet Take 1 tablet (10 mg total) by mouth daily. Patient not taking: Reported on 04/21/2018 10/07/17   Tereasa Coop, PA-C    No Known Allergies  Patient Active Problem List   Diagnosis Date Noted  . OSA on CPAP 01/15/2016  . Seasonal allergic rhinitis due to pollen 06/06/2015  . Esophageal reflux 06/06/2015  . Obesity 05/31/2014    Past Medical History:  Diagnosis Date  . Allergy   . GERD (gastroesophageal reflux disease)   . Hernia   . Hyperlipidemia     Past Surgical History:  Procedure Laterality Date  . GROIN DISSECTION  11/19/2011   Procedure: Virl Son EXPLORATION;  Surgeon: Harl Bowie, MD;  Location: [facility];  Service: General;  Laterality: Right;  Right Groin Exploration  . INGUINAL HERNIA REPAIR  2012   right  . INGUINAL HERNIA REPAIR  11/19/2011   Procedure: HERNIA REPAIR INGUINAL ADULT;  Surgeon: Harl Bowie, MD;  Location: [facility];  Service: General;  Laterality: Right;  excision of right groin mass     Social History   Socioeconomic History  . Marital status: Married    Spouse name: Not on file  . Number of children: Not on file  . Years of education: Not on file  . Highest education level: Not on file  Occupational History    Comment: Demolition work  Social Needs  . Financial resource strain: Not on file  . Food insecurity:    Worry: Not on file    Inability: Not on file  . Transportation needs:    Medical: Not on file    Non-medical: Not on file  Tobacco Use  . Smoking status: Former Smoker    Packs/day: 0.25    Years: 3.00    Pack years: 0.75    Types: Cigarettes  . Smokeless tobacco: Never Used  Substance and Sexual Activity  . 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 Drug use: No  . Sexual activity: Not on file  Lifestyle  . Physical activity:    Days per week: Not on file    Minutes per session: Not on file  . Stress: Not on file  Relationships  . Social connections:    Talks on phone: Not on file    Gets together: Not on file    Attends religious service: Not on file    Active member of club  or organization: Not on file    Attends meetings of clubs or organizations: Not on file    Relationship status: Not on file  . Intimate partner violence:    Fear of current or ex partner: Not on file    Emotionally abused: Not on file    Physically abused: Not on file    Forced sexual activity: Not on file  Other Topics Concern  . Not on file  Social History Narrative   Marital status: ; from [place]; [place] 25 years ago.      Children:  4 children; no grandchildren      Tobacco: none    Family History  Problem Relation Age of Onset  . Hypertension Mother   . Diabetes Mother   . Diabetes Father      Review of Systems  Constitutional: Negative.  Negative for chills and fever.  HENT: Negative.  Negative for sore throat.   Eyes: Negative.  Negative for blurred vision and double vision.  Respiratory: Negative.  Negative for cough and  wheezing.   Cardiovascular: Negative.  Negative for chest pain and palpitations.  Gastrointestinal: Negative.  Negative for abdominal pain, blood in stool, diarrhea, nausea and vomiting.  Genitourinary: Negative.  Negative for dysuria, flank pain and hematuria.  Musculoskeletal: Negative.  Negative for back pain and myalgias.  Skin: Positive for rash.  Neurological: Negative.  Negative for dizziness and headaches.  Endo/Heme/Allergies: Negative.   All other systems reviewed and are negative.   Vitals:   04/21/18 0803  BP: 119/80  Pulse: (!) 57  Resp: 16  Temp: 99.3 F (37.4 C)  SpO2: 97%    Physical Exam  Constitutional: He is oriented to person, place, and time. He appears well-developed and well-nourished.  HENT:  Head: Normocephalic and atraumatic.  Nose: Nose normal.  Mouth/Throat: Oropharynx is clear and moist.  Eyes: Pupils are equal, round, and reactive to light. Conjunctivae and EOM are normal.  Neck: Normal range of motion. Neck supple. No JVD present.  Cardiovascular: Normal rate, regular rhythm and normal heart sounds.  Pulmonary/Chest: Effort normal and breath sounds normal.  Abdominal: Soft. Bowel sounds are normal. He exhibits no distension. There is no tenderness. Hernia confirmed negative in the right inguinal area and confirmed negative in the left inguinal area.  Genitourinary: Testes normal. Uncircumcised.  Genitourinary Comments: Positive erythematous rash in the glans area compatible with balanitis  Musculoskeletal: Normal range of motion.  Lymphadenopathy:    He has no cervical adenopathy.  Neurological: He is alert and oriented to person, place, and time. No sensory deficit. He exhibits normal muscle tone.  Skin: Skin is warm and dry. Capillary refill takes less than 2 seconds.  Psychiatric: He has a normal mood and affect. His behavior is normal.  Vitals reviewed.   Results for orders placed or performed in visit on 04/21/18 (from the past 24  hour(s))  POCT glucose (manual entry)     Status: Abnormal   Collection Time: 04/21/18  8:30 AM  Result Value Ref Range   POC Glucose 124 (A) 70 - 99 mg/dl  POCT glycosylated hemoglobin (Hb A1C)     Status: Abnormal   Collection Time: 04/21/18  8:36 AM  Result Value Ref Range   Hemoglobin A1C 6.2 (A) 4.0 - 5.6 %   HbA1c POC (<> result, manual entry)     HbA1c, POC (prediabetic range)     HbA1c, POC (controlled diabetic range)     Wt Readings from Last 3 Encounters:  04/21/18 252 lb 6.4 oz (114.5 kg)  12/30/17 250 lb (113.4 kg)  11/11/17 251 lb 9.6 oz (114.1 kg)   Type 2 diabetes mellitus with hyperglycemia, without long-term current use of insulin (HCC) Hemoglobin A1c higher than last time.  6.2 today.  Will start metformin 500 mg twice a day.  Encouraged to continue diet and exercise.  Follow-up in 3 months.  A total of 40 minutes was spent in the room with the patient, greater than 50% of which was in counseling/coordination of care regarding diabetes and balanitis, treatment, nutrition, medications, lifestyle changes, blood results review, need for pneumonia vaccine, and need for follow-up in 3 months.  ASSESSMENT & PLAN: Elishah was seen today for diabetes and rash.  Diagnoses and all orders for this visit:  Type 2 diabetes mellitus with hyperglycemia, without long-term current use of insulin (HCC) -     POCT glucose (manual entry) -     POCT glycosylated hemoglobin (Hb A1C) -     metFORMIN (GLUCOPHAGE) 500 MG tablet; Take 1 tablet (500 mg total) by mouth 2 (two) times daily with a meal.  Balanitis -     clotrimazole-betamethasone (LOTRISONE) cream; Apply 1 application topically 2 (two) times daily for 10 days.  Need for prophylactic vaccination against Streptococcus pneumoniae (pneumococcus) -     Pneumococcal polysaccharide vaccine 23-valent greater than or equal to 2yo subcutaneous/IM    Patient Instructions       If you have lab work done today you will be  contacted with your lab results within the next 2 weeks.  If you have not heard from [name] then please contact us. The fastest way to get your results is to register for My Chart.   IF you received an x-ray today, you will receive an invoice from [facility]. Please contact [facility] at [phone] with questions or concerns regarding your invoice.   IF you received labwork today, you will receive an invoice from [facility]. Please contact [facility] at [phone] with questions or concerns regarding your invoice.   Our billing staff will not be able to assist you with questions regarding bills from these companies.  You will be contacted with the lab results as soon as they are available. The fastest way to get your results is to activate your My Chart account. Instructions are located on the last page of this paperwork. If you have not heard from [name] regarding the results in 2 weeks, please contact this office.    Balanitis (Balanitis) La balanitis es la inflamacin de la cabeza del pene (glande). CAUSAS Puede tener mltiples causas, tanto infecciosas como no infecciosas. Con frecuencia, la balanitis es el resultado de una higiene personal deficiente, especialmente en los hombres no circuncidados. Sin una higiene [place], los virus, las bacterias y los hongos se acumulan entre el prepucio y el glande. Esto puede ocasionar una infeccin. La falta de aire y la irritacin debido a la secrecin normal llamada esmegma contribuyen al problema en los hombres no circuncidados. Otras causas son:  Irritacin qumica por el uso de algunos jabones y geles de ducha (especialmente jabones con perfume), condones, lubricantes personales, vaselina, espermicidas y acondicionadores de telas.  Enfermedades de la piel, como el eczema, la dermatitis y la psoriasis.  Alergias a algunos frmacos, como tetraciclina y sulfas.  Algunas enfermedades como la cirrosis heptica, insuficiencia cardaca  congestiva y enfermedades renales.  Obesidad mrbida. FACTORES DE RIESGO  Diabetes mellitus.  Un prepucio apretado que es difcil de tirar Water engineer atrs [employer]  pasar el glande (fimosis).  Tener relaciones sexuales sin usar un condn.  [place] sntomas pueden ser:  Secrecin que proviene de la zona debajo del prepucio.  Sensibilidad.  Picazn e imposibilidad para Visual merchandiser ereccin (debido al dolor).  Irritacin y erupcin cutnea.  Llagas en el glande y el prepucio. DIAGNSTICO El diagnstico de balanitis se realiza a travs de un examen fsico. TRATAMIENTO El tratamiento se basa en la causa. El tratamiento puede incluir:  Lavados frecuentes.  Mantener el glande y el prepucio secos.  El uso de medicamentos, como cremas, Educational psychologist, antibiticos o medicamentos para tratar infecciones por hongos.  Baos de asiento. Si la irritacin est originada en una cicatriz del prepucio que impide una retraccin fcil, se recomienda una circuncisin. INSTRUCCIONES PARA EL CUIDADO EN EL HOGAR  Debe evitar las relaciones sexuales hasta que la afeccin se haya mejorado.  ASEGRESE DE QUE:  Comprende estas instrucciones.  Controlar su afeccin.  Recibir ayuda de inmediato si no mejora o si empeora.  Esta informacin no tiene Marine scientist el consejo del mdico. Asegrese de hacerle al mdico cualquier pregunta que tenga. Document Released: 03/14/2013 Document Revised: 07/17/2013 Document Reviewed: 01/01/2013 Elsevier Interactive Patient Education  2017 [place].  Diabetes mellitus y nutricin Diabetes Mellitus and Nutrition Si sufre de diabetes (diabetes mellitus), es muy importante tener hbitos alimenticios saludables debido a que sus niveles de Designer, television/film set sangre (glucosa) se ven afectados en gran medida por lo que come y bebe. [place] alimentos saludables en las cantidades [place], aproximadamente a la [employer], [place] ayudar  a:  Aeronautical engineer glucemia.  Disminuir el riesgo de sufrir una enfermedad cardaca.  Mejorar la presin arterial.  Science writer o mantener un peso saludable.  Todas las personas que sufren de diabetes son diferentes y cada una tiene necesidades diferentes en cuanto a un plan de alimentacin. El mdico puede recomendarle que trabaje con un especialista en dietas y nutricin (nutricionista) para Financial trader plan para usted. Su plan de alimentacin puede variar segn factores como:  Las caloras que necesita.  Los medicamentos que toma.  Su peso.  Sus niveles de glucemia, presin arterial y colesterol.  Su nivel de [place].  Otras afecciones que tenga, como enfermedades cardacas o renales.  Cmo me afectan los carbohidratos? Los carbohidratos afectan el nivel de glucemia ms que cualquier otro tipo de alimento. La ingesta de carbohidratos naturalmente aumenta la cantidad glucosa en la sangre. El recuento de carbohidratos es un mtodo destinado a Catering manager un registro de la cantidad de carbohidratos que se ingieren. El recuento de carbohidratos es importante para Theatre manager la glucemia a un nivel saludable, en especial si utiliza insulina o toma determinados medicamentos por va oral para la diabetes. Es importante saber la cantidad de carbohidratos que se pueden ingerir en cada comida sin correr Engineer, manufacturing. Esto es Psychologist, forensic. El nutricionista puede ayudarlo a calcular la cantidad de carbohidratos que debe ingerir en cada comida y colacin. Los alimentos que contienen carbohidratos incluyen:  Pan, cereal, arroz, pasta y galletas.  Papas y maz.  Guisantes, frijoles y lentejas.  Leche y Estate agent.  Lambert Mody y [place].  Postres, como pasteles, galletitas, helado y caramelos.  Cmo me afecta el alcohol? El alcohol puede provocar disminuciones sbitas de la glucemia (hipoglucemia), en especial si utiliza insulina o toma determinados medicamentos por va oral para la  diabetes. La hipoglucemia es una afeccin potencialmente mortal. Los sntomas de la hipoglucemia (somnolencia, mareos y confusin)  son similares a los sntomas de haber consumido demasiado alcohol. Si el mdico afirma que el alcohol es seguro para usted, siga estas pautas:  Limite el consumo de alcohol a no ms de 1 medida por da si es mujer y no est Music therapist, y a 2 medidas si es hombre. Una medida equivale a 12oz (382ml) de cerveza, 5oz (116ml) de vino o 1oz (39ml) de bebidas de alta graduacin alcohlica.  No beba con el estmago vaco.  Mantngase hidratado con agua, gaseosas dietticas o t helado sin azcar.  Tenga en cuenta que las gaseosas comunes, los jugos y otros refrescos pueden contener mucha azcar y se deben contar como carbohidratos.  Consejos para seguir Photographer las etiquetas de los alimentos  Comience por controlar el tamao de la porcin en la etiqueta. La cantidad de caloras, carbohidratos, grasas y otros nutrientes mencionados en la etiqueta se basan en una porcin del alimento. Muchos alimentos contienen ms de una porcin por envase.  Verifique la cantidad total de gramos (g) de carbohidratos totales en una porcin. Puede calcular la cantidad de porciones de carbohidratos al dividir el total de carbohidratos por 15. Por ejemplo, si un alimento posee un total de 30g de carbohidratos, equivale a 2 porciones de carbohidratos.  Verifique la cantidad de gramos (g) de grasas saturadas y grasas trans en una porcin. Escoja alimentos que no contengan grasa o que tengan un bajo contenido.  Controle la cantidad de miligramos (mg) de sodio en una porcin. La mayora de las personas deben limitar la ingesta de sodio total a menos de 2300mg  por Training and development officer.  Siempre consulte la informacin nutricional de los alimentos etiquetados como "con bajo contenido de grasa" o "sin grasa". Estos alimentos pueden ser ms altos en azcar agregada o en carbohidratos refinados y deben  evitarse.  Hable con el nutricionista para identificar sus objetivos diarios en cuanto a los nutrientes mencionados en la etiqueta. De compras  Evite comprar alimentos procesados, enlatados o prehechos. Estos alimentos tienden a TEFL teacher cantidad de [place], sodio y azcar agregada.  Compre en la zona exterior de la tienda de comestibles. Esta incluye frutas y [employer], granos a granel, carnes frescas y productos lcteos frescos. Coccin  Utilice mtodos de coccin a baja temperatura, como hornear, en lugar de mtodos de coccin a alta temperatura, como frer en abundante aceite.  Cocine con aceites saludables, como el aceite de [place], canola o [place].  Evite cocinar con manteca, crema o carnes con alto contenido de grasa. Planificacin de las comidas  [employer] comidas y las colaciones de forma regular, preferentemente a la misma hora todos [place]. Evite pasar largos perodos de tiempo sin comer.  Consuma alimentos ricos en fibra, como frutas frescas, verduras, frijoles y cereales integrales. Consulte al nutricionista sobre cuntas porciones de carbohidratos puede consumir en cada comida.  Consuma entre 4 y 6 onzas de protenas magras por da, como carnes [place], pollo, pescado, [employer] o tofu. 1 onza equivale a 1 onza de carne, pollo o pescado, 1 huevo, o 1/4 taza de tofu.  Coma algunos alimentos por da que contengan grasas saludables, como aguacates, frutos secos, semillas y pescado. Estilo de vida   Controle su nivel de glucemia con regularidad.  Haga ejercicio al menos 52minutos, 5das o ms por semana, o como se lo haya indicado el mdico.  Tome los [employer] se lo haya indicado el mdico.  No consuma ningn producto que contenga nicotina o tabaco, como cigarrillos y Psychologist, sport and exercise. Si necesita  ayuda para dejar de fumar, consulte al [employer] con un asesor o instructor en diabetes para identificar estrategias para controlar el estrs  y cualquier desafo emocional y social. Cules son algunas de las preguntas que puedo hacerle a mi mdico?  Es necesario que me rena con Radio broadcast assistant en diabetes?  Es necesario que me rena con un nutricionista?  A qu nmero puedo llamar si tengo preguntas?  Cules son los mejores momentos para controlar la glucemia? Dnde encontrar ms informacin:  Asociacin Americana de la Diabetes (American Diabetes Association): diabetes.org/food-and-fitness/food  Academia de Nutricin y Information systems manager (Academy of Nutrition and Dietetics): PokerClues.dk  [place] Diabetes y [place] y Water quality scientist [facility] of Diabetes and Digestive and Kidney Diseases) ([place], NIH): ContactWire.be Resumen  Un plan de alimentacin saludable lo ayudar a Aeronautical engineer glucemia y Theatre manager un estilo de vida saludable.  Trabajar con un especialista en dietas y nutricin (nutricionista) puede ayudarlo a Insurance claims handler de alimentacin para usted.  Tenga en cuenta que los carbohidratos y el alcohol tienen efectos inmediatos en sus niveles de glucemia. Es importante contar los carbohidratos y consumir alcohol con prudencia. Esta informacin no tiene Marine scientist el consejo del mdico. Asegrese de hacerle al mdico cualquier pregunta que tenga. Document Released: 10/19/2007 Document Revised: 11/01/2016 Document Reviewed: 11/01/2016 Elsevier Interactive Patient Education  2018 Elsevier Inc.      Agustina Caroli, MD [facility]

## 2018-04-21 NOTE — Assessment & Plan Note (Signed)
Hemoglobin A1c higher than last time.  6.2 today.  Will start metformin 500 mg twice a day.  Encouraged to continue diet and exercise.  Follow-up in 3 months.

## 2018-07-11 ENCOUNTER — Ambulatory Visit: Payer: BLUE CROSS/BLUE SHIELD | Admitting: Emergency Medicine

## 2018-07-11 ENCOUNTER — Other Ambulatory Visit: Payer: Self-pay

## 2018-07-11 ENCOUNTER — Encounter: Payer: Self-pay | Admitting: Emergency Medicine

## 2018-07-11 VITALS — BP 135/88 | HR 59 | Temp 98.6°F | Resp 16 | Ht 68.5 in | Wt 257.6 lb

## 2018-07-11 DIAGNOSIS — Z23 Encounter for immunization: Secondary | ICD-10-CM | POA: Diagnosis not present

## 2018-07-11 DIAGNOSIS — E1165 Type 2 diabetes mellitus with hyperglycemia: Secondary | ICD-10-CM

## 2018-07-11 LAB — GLUCOSE, POCT (MANUAL RESULT ENTRY): POC Glucose: 110 mg/dl — AB (ref 70–99)

## 2018-07-11 LAB — POCT GLYCOSYLATED HEMOGLOBIN (HGB A1C): Hemoglobin A1C: 6.3 % — AB (ref 4.0–5.6)

## 2018-07-11 LAB — COMPREHENSIVE METABOLIC PANEL
A/G RATIO: 1.5 (ref 1.2–2.2)
ALBUMIN: 4.4 g/dL (ref 3.5–5.5)
ALT: 62 IU/L — AB (ref 0–44)
AST: 39 IU/L (ref 0–40)
Alkaline Phosphatase: 80 IU/L (ref 39–117)
BILIRUBIN TOTAL: 1.3 mg/dL — AB (ref 0.0–1.2)
BUN / CREAT RATIO: 19 (ref 9–20)
BUN: 13 mg/dL (ref 6–24)
CHLORIDE: 104 mmol/L (ref 96–106)
CO2: 21 mmol/L (ref 20–29)
Calcium: 9.6 mg/dL (ref 8.7–10.2)
Creatinine, Ser: 0.69 mg/dL — ABNORMAL LOW (ref 0.76–1.27)
GFR calc non Af Amer: 115 mL/min/{1.73_m2} (ref >59)
GFR, EST AFRICAN AMERICAN: 133 mL/min/{1.73_m2} (ref >59)
GLOBULIN, TOTAL: 2.9 g/dL (ref 1.5–4.5)
Glucose: 105 mg/dL — ABNORMAL HIGH (ref 65–99)
POTASSIUM: 3.7 mmol/L (ref 3.5–5.2)
Sodium: 141 mmol/L (ref 134–144)
TOTAL PROTEIN: 7.3 g/dL (ref 6.0–8.5)

## 2018-07-11 LAB — LIPID PANEL
CHOL/HDL RATIO: 4.3 ratio (ref 0.0–5.0)
CHOLESTEROL TOTAL: 168 mg/dL (ref 100–199)
HDL: 39 mg/dL — AB (ref >39)
LDL Calculated: 103 mg/dL — ABNORMAL HIGH (ref 0–99)
Triglycerides: 132 mg/dL (ref 0–149)
VLDL Cholesterol Cal: 26 mg/dL (ref 5–40)

## 2018-07-11 MED ORDER — BLOOD GLUCOSE MONITOR KIT: PACK | 0 refills | Status: AC

## 2018-07-11 MED ORDER — LISINOPRIL 10 MG PO TABS: 10.0000 mg | ORAL_TABLET | Freq: Every day | ORAL | 3 refills | Status: DC

## 2018-07-11 MED ORDER — ROSUVASTATIN CALCIUM 10 MG PO TABS: 10.0000 mg | ORAL_TABLET | Freq: Every day | ORAL | 3 refills | Status: DC

## 2018-07-11 NOTE — Patient Instructions (Addendum)
If you have lab work done today you will be contacted with your lab results within the next 2 weeks.  If you have not heard from [name] then please contact us. The fastest way to get your results is to register for My Chart.   IF you received an x-ray today, you will receive an invoice from [facility]. Please contact [facility] at [phone] with questions or concerns regarding your invoice.   IF you received labwork today, you will receive an invoice from [facility]. Please contact [facility] at [phone] with questions or concerns regarding your invoice.   Our billing staff will not be able to assist you with questions regarding bills from these companies.  You will be contacted with the lab results as soon as they are available. The fastest way to get your results is to activate your My Chart account. Instructions are located on the last page of this paperwork. If you have not heard from [name] regarding the results in 2 weeks, please contact this office.     Diabetes mellitus y nutricin Diabetes Mellitus and Nutrition Si sufre de diabetes (diabetes mellitus), es muy importante tener hbitos alimenticios saludables debido a que sus niveles de Designer, television/film set sangre (glucosa) se ven afectados en gran medida por lo que come y bebe. [place] alimentos saludables en las cantidades [place], aproximadamente a la [employer], [place] ayudar a:  Aeronautical engineer glucemia.  Disminuir el riesgo de sufrir una enfermedad cardaca.  Mejorar la presin arterial.  Science writer o mantener un peso saludable.  Todas las personas que sufren de diabetes son diferentes y cada una tiene necesidades diferentes en cuanto a un plan de alimentacin. El mdico puede recomendarle que trabaje con un especialista en dietas y nutricin (nutricionista) para Financial trader plan para usted. Su plan de alimentacin puede variar segn factores como:  Las caloras que necesita.  Los medicamentos  que toma.  Su peso.  Sus niveles de glucemia, presin arterial y colesterol.  Su nivel de [place].  Otras afecciones que tenga, como enfermedades cardacas o renales.  Cmo me afectan los carbohidratos? Los carbohidratos afectan el nivel de glucemia ms que cualquier otro tipo de alimento. La ingesta de carbohidratos naturalmente aumenta la cantidad glucosa en la sangre. El recuento de carbohidratos es un mtodo destinado a Catering manager un registro de la cantidad de carbohidratos que se ingieren. El recuento de carbohidratos es importante para Theatre manager la glucemia a un nivel saludable, en especial si utiliza insulina o toma determinados medicamentos por va oral para la diabetes. Es importante saber la cantidad de carbohidratos que se pueden ingerir en cada comida sin correr Engineer, manufacturing. Esto es Psychologist, forensic. El nutricionista puede ayudarlo a calcular la cantidad de carbohidratos que debe ingerir en cada comida y colacin. Los alimentos que contienen carbohidratos incluyen:  Pan, cereal, arroz, pasta y galletas.  Papas y maz.  Guisantes, frijoles y lentejas.  [place]eche y Estate agent.  [place] y [place].  Postres, como pasteles, galletitas, helado y caramelos.  Cmo me afecta el alcohol? El alcohol puede provocar disminuciones sbitas de la glucemia (hipoglucemia), en especial si utiliza insulina o toma determinados medicamentos por va oral para la diabetes. La hipoglucemia es una afeccin potencialmente mortal. Los sntomas de la hipoglucemia (somnolencia, mareos y confusin) son similares a los sntomas de haber consumido demasiado alcohol. Si el mdico afirma que el alcohol es seguro para usted, [place] estas pautas:  Limite el consumo de alcohol a no  ms de 1 medida por da si es mujer y no est Music therapist, y a 2 medidas si es hombre. Una medida equivale a 12oz (358ml) de cerveza, 5oz (176ml) de vino o 1oz (53ml) de bebidas de alta graduacin alcohlica.  No beba con el  estmago vaco.  Mantngase hidratado con agua, gaseosas dietticas o t helado sin azcar.  Tenga en cuenta que las gaseosas comunes, los jugos y otros refrescos pueden contener mucha azcar y se deben contar como carbohidratos.  Consejos para seguir Photographer las etiquetas de los alimentos  Comience por controlar el tamao de la porcin en la etiqueta. La cantidad de caloras, carbohidratos, grasas y otros nutrientes mencionados en la etiqueta se basan en una porcin del alimento. Muchos alimentos contienen ms de una porcin por envase.  Verifique la cantidad total de gramos (g) de carbohidratos totales en una porcin. Puede calcular la cantidad de porciones de carbohidratos al dividir el total de carbohidratos por 15. Por ejemplo, si un alimento posee un total de 30g de carbohidratos, equivale a 2 porciones de carbohidratos.  Verifique la cantidad de gramos (g) de grasas saturadas y grasas trans en una porcin. Escoja alimentos que no contengan grasa o que tengan un bajo contenido.  Controle la cantidad de miligramos (mg) de sodio en una porcin. La mayora de las personas deben limitar la ingesta de sodio total a menos de 2300mg  por Training and development officer.  Siempre consulte la informacin nutricional de los alimentos etiquetados como "con bajo contenido de grasa" o "sin grasa". Estos alimentos pueden ser ms altos en azcar agregada o en carbohidratos refinados y deben evitarse.  Hable con el nutricionista para identificar sus objetivos diarios en cuanto a los nutrientes mencionados en la etiqueta. De compras  Evite comprar alimentos procesados, enlatados o prehechos. Estos alimentos tienden a TEFL teacher cantidad de [place], sodio y azcar agregada.  Compre en la zona exterior de la tienda de comestibles. Esta incluye frutas y [employer], granos a granel, carnes frescas y productos lcteos frescos. Coccin  Utilice mtodos de coccin a baja temperatura, como hornear, en lugar de mtodos de  coccin a alta temperatura, como frer en abundante aceite.  Cocine con aceites saludables, como el aceite de [place], canola o [place].  Evite cocinar con manteca, crema o carnes con alto contenido de grasa. Planificacin de las comidas  [employer] comidas y las colaciones de forma regular, preferentemente a la misma hora todos [place]. Evite pasar largos perodos de tiempo sin comer.  Consuma alimentos ricos en fibra, como frutas frescas, verduras, frijoles y cereales integrales. Consulte al nutricionista sobre cuntas porciones de carbohidratos puede consumir en cada comida.  Consuma entre 4 y 6 onzas de protenas magras por da, como carnes [place], pollo, pescado, [employer] o tofu. 1 onza equivale a 1 onza de carne, pollo o pescado, 1 huevo, o 1/4 taza de tofu.  Coma algunos alimentos por da que contengan grasas saludables, como aguacates, frutos secos, semillas y pescado. Estilo de vida   Controle su nivel de glucemia con regularidad.  Haga ejercicio al menos 58minutos, 5das o ms por semana, o como se lo haya indicado el mdico.  Tome los [employer] se lo haya indicado el mdico.  No consuma ningn producto que contenga nicotina o tabaco, como cigarrillos y Psychologist, sport and exercise. Si necesita ayuda para dejar de fumar, consulte al [employer] con un asesor o instructor en diabetes para identificar estrategias para controlar el estrs y cualquier desafo emocional y social. Engineer, agricultural  son algunas de las preguntas que puedo hacerle a mi mdico?  Es necesario que me rena con Radio broadcast assistant en diabetes?  Es necesario que me rena con un nutricionista?  A qu nmero puedo llamar si tengo preguntas?  Cules son los mejores momentos para controlar la glucemia? Dnde encontrar ms informacin:  Asociacin Americana de la Diabetes (American Diabetes Association): diabetes.org/food-and-fitness/food  Academia de Nutricin y Information systems manager (Academy of Nutrition and  Dietetics): PokerClues.dk  [place] Diabetes y [place] y Water quality scientist [facility] of Diabetes and Digestive and Kidney Diseases) ([place], NIH): ContactWire.be Resumen  Un plan de alimentacin saludable lo ayudar a Aeronautical engineer glucemia y Theatre manager un estilo de vida saludable.  Trabajar con un especialista en dietas y nutricin (nutricionista) puede ayudarlo a Insurance claims handler de alimentacin para usted.  Tenga en cuenta que los carbohidratos y el alcohol tienen efectos inmediatos en sus niveles de glucemia. Es importante contar los carbohidratos y consumir alcohol con prudencia. Esta informacin no tiene Marine scientist el consejo del mdico. Asegrese de hacerle al mdico cualquier pregunta que tenga. Document Released: 10/19/2007 Document Revised: 11/01/2016 Document Reviewed: 11/01/2016 Elsevier Interactive Patient Education  2018 [employer].

## 2018-07-11 NOTE — Assessment & Plan Note (Signed)
Hemoglobin A1c at 6.3.  Will continue metformin 500 mg twice a day.  Will use lisinopril 10 mg a day for renal protection.  Blood pressure little high today.  Will start Crestor 10 mg a day.  CMP and lipid profile done today.  Follow-up in 3 months.

## 2018-07-11 NOTE — Progress Notes (Signed)
Lab Results  Component Value Date   HGBA1C 6.2 (A) 04/21/2018   BP Readings from Last 3 Encounters:  04/21/18 119/80  12/30/17 128/90  11/11/17 118/78   Dominic [name] 42 y.o.   Chief Complaint  Patient presents with  . Diabetes    and Chronic Medical Condition- 3 months    HISTORY OF PRESENT ILLNESS: This is a 42 y.o. male with history of diabetes here for follow-up.  Doing well.  Has no complaints.  Drinking much less beer than before.  HPI   Prior to Admission medications   Medication Sig Start Date End Date Taking? Authorizing Provider  fluticasone (FLONASE) 50 MCG/ACT nasal spray Place 2 sprays into both nostrils daily. 10/07/17  Yes Tereasa Coop, PA-C  loratadine (CLARITIN) 10 MG tablet Take 1 tablet (10 mg total) by mouth daily. 10/07/17  Yes Tereasa Coop, PA-C  metFORMIN (GLUCOPHAGE) 500 MG tablet Take 1 tablet (500 mg total) by mouth 2 (two) times daily with a meal. 04/21/18  Yes Bedford Winsor, Ines Bloomer, MD  omeprazole (PRILOSEC) 20 MG capsule Take 1 capsule (20 mg total) by mouth daily. Take thirty minutes before the first meal of the day. 10/07/17  Yes Tereasa Coop, PA-C    No Known Allergies  Patient Active Problem List   Diagnosis Date Noted  . Type 2 diabetes mellitus with hyperglycemia, without long-term current use of insulin ([place]) 04/21/2018  . OSA on CPAP 01/15/2016  . Seasonal allergic rhinitis due to pollen 06/06/2015  . Esophageal reflux 06/06/2015  . Obesity 05/31/2014    Past Medical History:  Diagnosis Date  . Allergy   . GERD (gastroesophageal reflux disease)   . Hernia   . Hyperlipidemia     Past Surgical History:  Procedure Laterality Date  . GROIN DISSECTION  11/19/2011   Procedure: Virl Son EXPLORATION;  Surgeon: Harl Bowie, MD;  Location: [facility];  Service: General;  Laterality: Right;  Right Groin Exploration  . INGUINAL HERNIA REPAIR  2012   right  . INGUINAL HERNIA REPAIR  11/19/2011   Procedure: HERNIA REPAIR  INGUINAL ADULT;  Surgeon: Harl Bowie, MD;  Location: [facility];  Service: General;  Laterality: Right;  excision of right groin mass    Social History   Socioeconomic History  . Marital status: Married    Spouse name: Not on file  . Number of children: Not on file  . Years of education: Not on file  . Highest education level: Not on file  Occupational History    Comment: Demolition work  Social Needs  . Financial resource strain: Not on file  . Food insecurity:    Worry: Not on file    Inability: Not on file  . Transportation needs:    Medical: Not on file    Non-medical: Not on file  Tobacco Use  . Smoking status: Former Smoker    Packs/day: 0.25    Years: 3.00    Pack years: 0.75    Types: Cigarettes  . Smokeless tobacco: Never Used  Substance and Sexual Activity  . 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 Drug use: No  . Sexual activity: Not on file  Lifestyle  . Physical activity:    Days per week: Not on file    Minutes per session: Not on file  . Stress: Not on file  Relationships  . Social connections:    Talks on phone: Not on file  Gets together: Not on file    Attends religious service: Not on file    Active member of club or organization: Not on file    Attends meetings of clubs or organizations: Not on file    Relationship status: Not on file  . Intimate partner violence:    Fear of current or ex partner: Not on file    Emotionally abused: Not on file    Physically abused: Not on file    Forced sexual activity: Not on file  Other Topics Concern  . Not on file  Social History Narrative   Marital status: ; from [place]; [place] 25 years ago.      Children:  4 children; no grandchildren      Tobacco: none    Family History  Problem Relation Age of Onset  . Hypertension Mother   . Diabetes Mother   . Diabetes Father      Review of Systems  Constitutional: Negative.  Negative for chills and fever.  HENT: Negative.   Eyes:  Negative.  Negative for blurred vision and double vision.  Respiratory: Negative.  Negative for cough and shortness of breath.   Cardiovascular: Negative.  Negative for chest pain and palpitations.  Gastrointestinal: Negative.  Negative for abdominal pain, diarrhea, nausea and vomiting.  Genitourinary: Negative.   Musculoskeletal: Negative.   Skin: Negative.   Neurological: Negative.  Negative for dizziness and headaches.  Endo/Heme/Allergies: Negative.   All other systems reviewed and are negative.   Vitals:   07/11/18 0805  BP: 135/88  Pulse: (!) 59  Resp: 16  Temp: 98.6 F (37 C)  SpO2: 97%    Physical Exam Vitals signs reviewed.  Constitutional:      Appearance: Normal appearance.  HENT:     Head: Normocephalic and atraumatic.     Nose: Nose normal.     Mouth/Throat:     Mouth: Mucous membranes are moist.     Pharynx: Oropharynx is clear.  Eyes:     Extraocular Movements: Extraocular movements intact.     Conjunctiva/sclera: Conjunctivae normal.     Pupils: Pupils are equal, round, and reactive to light.  Neck:     Musculoskeletal: Normal range of motion.  Cardiovascular:     Rate and Rhythm: Normal rate and regular rhythm.     Heart sounds: Normal heart sounds.  Pulmonary:     Effort: Pulmonary effort is normal.     Breath sounds: Normal breath sounds.  Abdominal:     General: Abdomen is flat. There is no distension.     Tenderness: There is no abdominal tenderness.  Musculoskeletal: Normal range of motion.  Skin:    General: Skin is warm and dry.     Capillary Refill: Capillary refill takes less than 2 seconds.  Neurological:     General: No focal deficit present.     Mental Status: He is alert and oriented to person, place, and time.  Psychiatric:        Mood and Affect: Mood normal.        Behavior: Behavior normal.    Results for orders placed or performed in visit on 07/11/18 (from the past 24 hour(s))  POCT glucose (manual entry)     Status:  Abnormal   Collection Time: 07/11/18  8:30 AM  Result Value Ref Range   POC Glucose 110 (A) 70 - 99 mg/dl  POCT glycosylated hemoglobin (Hb A1C)     Status: Abnormal   Collection Time: 07/11/18  8:36  AM  Result Value Ref Range   Hemoglobin A1C 6.3 (A) 4.0 - 5.6 %   HbA1c POC (<> result, manual entry)     HbA1c, POC (prediabetic range)     HbA1c, POC (controlled diabetic range)       A total of 40 minutes was spent in the room with the patient, greater than 50% of which was in counseling/coordination of care regarding diabetes management, medications, new medications, review of blood work, nutrition, lifestyle changes, and need for follow-up in 3 months..   ASSESSMENT & PLAN: Type 2 diabetes mellitus with hyperglycemia, without long-term current use of insulin (HCC) Hemoglobin A1c at 6.3.  Will continue metformin 500 mg twice a day.  Will use lisinopril 10 mg a day for renal protection.  Blood pressure little high today.  Will start Crestor 10 mg a day.  CMP and lipid profile done today.  Follow-up in 3 months.  Dominic [name] was seen today for diabetes.  Diagnoses and all orders for this visit:  Type 2 diabetes mellitus with hyperglycemia, without long-term current use of insulin (HCC) -     POCT glucose (manual entry) -     POCT glycosylated hemoglobin (Hb A1C) -     blood glucose meter kit and supplies KIT; Dispense based on patient and insurance preference. Use up to four times daily as directed. (FOR ICD-9 250.00, 250.01). -     Lipid panel -     Comprehensive metabolic panel -     rosuvastatin (CRESTOR) 10 MG tablet; Take 1 tablet (10 mg total) by mouth daily. -     lisinopril (PRINIVIL,ZESTRIL) 10 MG tablet; Take 1 tablet (10 mg total) by mouth daily.    Patient Instructions       If you have lab work done today you will be contacted with your lab results within the next 2 weeks.  If you have not heard from [name] then please contact us. The fastest way to get your results is to  register for My Chart.   IF you received an x-ray today, you will receive an invoice from [facility]. Please contact [facility] at [phone] with questions or concerns regarding your invoice.   IF you received labwork today, you will receive an invoice from [facility]. Please contact [facility] at [phone] with questions or concerns regarding your invoice.   Our billing staff will not be able to assist you with questions regarding bills from these companies.  You will be contacted with the lab results as soon as they are available. The fastest way to get your results is to activate your My Chart account. Instructions are located on the last page of this paperwork. If you have not heard from [name] regarding the results in 2 weeks, please contact this office.     Diabetes mellitus y nutricin Diabetes Mellitus and Nutrition Si sufre de diabetes (diabetes mellitus), es muy importante tener hbitos alimenticios saludables debido a que sus niveles de Designer, television/film set sangre (glucosa) se ven afectados en gran medida por lo que come y bebe. [place] alimentos saludables en las cantidades [place], aproximadamente a la [employer], [place] ayudar a:  Aeronautical engineer glucemia.  Disminuir el riesgo de sufrir una enfermedad cardaca.  Mejorar la presin arterial.  Science writer o mantener un peso saludable.  Todas las personas que sufren de diabetes son diferentes y cada una tiene necesidades diferentes en cuanto a un plan de alimentacin. El mdico puede recomendarle que trabaje con un especialista  en dietas y nutricin (nutricionista) para Financial trader plan para usted. Su plan de alimentacin puede variar segn factores como:  Las caloras que necesita.  Los medicamentos que toma.  Su peso.  Sus niveles de glucemia, presin arterial y colesterol.  Su nivel de [place].  Otras afecciones que tenga, como enfermedades cardacas o renales.  Cmo me afectan los  carbohidratos? Los carbohidratos afectan el nivel de glucemia ms que cualquier otro tipo de alimento. La ingesta de carbohidratos naturalmente aumenta la cantidad glucosa en la sangre. El recuento de carbohidratos es un mtodo destinado a Catering manager un registro de la cantidad de carbohidratos que se ingieren. El recuento de carbohidratos es importante para Theatre manager la glucemia a un nivel saludable, en especial si utiliza insulina o toma determinados medicamentos por va oral para la diabetes. Es importante saber la cantidad de carbohidratos que se pueden ingerir en cada comida sin correr Engineer, manufacturing. Esto es Psychologist, forensic. El nutricionista puede ayudarlo a calcular la cantidad de carbohidratos que debe ingerir en cada comida y colacin. Los alimentos que contienen carbohidratos incluyen:  Pan, cereal, arroz, pasta y galletas.  Papas y maz.  Guisantes, frijoles y lentejas.  Leche y Estate agent.  Lambert Mody y [place].  Postres, como pasteles, galletitas, helado y caramelos.  Cmo me afecta el alcohol? El alcohol puede provocar disminuciones sbitas de la glucemia (hipoglucemia), en especial si utiliza insulina o toma determinados medicamentos por va oral para la diabetes. La hipoglucemia es una afeccin potencialmente mortal. Los sntomas de la hipoglucemia (somnolencia, mareos y confusin) son similares a los sntomas de haber consumido demasiado alcohol. Si el mdico afirma que el alcohol es seguro para usted, siga estas pautas:  Limite el consumo de alcohol a no ms de 1 medida por da si es mujer y no est Music therapist, y a 2 medidas si es hombre. Una medida equivale a 12oz (387m) de cerveza, 5oz (1460m de vino o 1oz (4474mde bebidas de alta graduacin alcohlica.  No beba con el estmago vaco.  Mantngase hidratado con agua, gaseosas dietticas o t helado sin azcar.  Tenga en cuenta que las gaseosas comunes, los jugos y otros refrescos pueden contener mucha azcar y se deben  contar como carbohidratos.  Consejos para seguir estPhotographers etiquetas de los alimentos  Comience por controlar el tamao de la porcin en la etiqueta. La cantidad de caloras, carbohidratos, grasas y otros nutrientes mencionados en la etiqueta se basan en una porcin del alimento. Muchos alimentos contienen ms de una porcin por envase.  Verifique la cantidad total de gramos (g) de carbohidratos totales en una porcin. Puede calcular la cantidad de porciones de carbohidratos al dividir el total de carbohidratos por 15. Por ejemplo, si un alimento posee un total de 30g de carbohidratos, equivale a 2 porciones de carbohidratos.  Verifique la cantidad de gramos (g) de grasas saturadas y grasas trans en una porcin. Escoja alimentos que no contengan grasa o que tengan un bajo contenido.  Controle la cantidad de miligramos (mg) de sodio en una porcin. La mayState Farm las personas deben limitar la ingesta de sodio total a menos de 2300m79mr da. Training and development officeriempre consulte la informacin nutricional de los alimentos etiquetados como "con bajo contenido de grasa" o "sin grasa". Estos alimentos pueden ser ms altos en azcar agregada o en carbohidratos refinados y deben evitarse.  Hable con el nutricionista para identificar sus objetivos diarios en cuanto a los nutrientes mencionados en la etiqueta. De compras  Evite  comprar alimentos procesados, enlatados o prehechos. Estos alimentos tienden a TEFL teacher cantidad de Mutual, sodio y azcar agregada.  Compre en la zona exterior de la tienda de comestibles. Esta incluye frutas y [employer], granos a granel, carnes frescas y productos lcteos frescos. Coccin  Utilice mtodos de coccin a baja temperatura, como hornear, en lugar de mtodos de coccin a alta temperatura, como frer en abundante aceite.  Cocine con aceites saludables, como el aceite de [place], can[place] o [place].  Evite cocinar con manteca, crema o carnes con alto contenido de  grasa. Planificacin de las comidas  [employer] comidas y las colaciones de forma regular, preferentemente a la misma hora todos [place]. Evite pasar largos perodos de tiempo sin comer.  Consuma alimentos ricos en fibra, como frutas frescas, verduras, frijoles y cereales integrales. Consulte al nutricionista sobre cuntas porciones de carbohidratos puede consumir en cada comida.  Consuma entre 4 y 6 onzas de protenas magras por da, como carnes [place], pollo, pescado, [employer] o tofu. 1 onza equivale a 1 onza de carne, pollo o pescado, 1 huevo, o 1/4 taza de tofu.  Coma algunos alimentos por da que contengan grasas saludables, como aguacates, frutos secos, semillas y pescado. Estilo de vida   Controle su nivel de glucemia con regularidad.  Haga ejercicio al menos 24mnutos, 5das o ms por semana, o como se lo haya indicado el mdico.  Tome los mTenneco Incse lo haya indicado el mdico.  No consuma ningn producto que contenga nicotina o tabaco, como cigarrillos y cPsychologist, sport and exercise Si necesita ayuda para dejar de fumar, consulte al mHess Corporationcon un asesor o instructor en diabetes para identificar estrategias para controlar el estrs y cualquier desafo emocional y social. Cules son algunas de las preguntas que puedo hacerle a mi mdico?  Es necesario que me rena con uRadio broadcast assistanten diabetes?  Es necesario que me rena con un nutricionista?  A qu nmero puedo llamar si tengo preguntas?  Cules son los mejores momentos para controlar la glucemia? Dnde encontrar ms informacin:  Asociacin Americana de la Diabetes (American Diabetes Association): diabetes.org/food-and-fitness/food  Academia de Nutricin y DInformation systems manager(Academy of Nutrition and Dietetics): wPokerClues.dk ISterlingDiabetes y lEast Rancho Dominguezy RWater quality scientist([facility] Regional Medical Centerof Diabetes and Digestive and  Kidney Diseases) (IM[place] NIH): wContactWire.beResumen  Un plan de alimentacin saludable lo ayudar a cAeronautical engineerglucemia y mTheatre managerun estilo de vida saludable.  Trabajar con un especialista en dietas y nutricin (nutricionista) puede ayudarlo a eInsurance claims handlerde alimentacin para usted.  Tenga en cuenta que los carbohidratos y el alcohol tienen efectos inmediatos en sus niveles de glucemia. Es importante contar los carbohidratos y consumir alcohol con prudencia. Esta informacin no tiene cMarine scientistel consejo del mdico. Asegrese de hacerle al mdico cualquier pregunta que tenga. Document Released: 10/19/2007 Document Revised: 11/01/2016 Document Reviewed: 11/01/2016 Elsevier Interactive Patient Education  2018 Elsevier Inc.      MAgustina Caroli MD [facility]Manatee RoadGroup

## 2018-07-11 NOTE — Addendum Note (Signed)
Addended by: Alfredia Ferguson A on: 07/11/2018 01:21 PM   Modules accepted: Orders

## 2018-07-12 ENCOUNTER — Encounter: Payer: Self-pay | Admitting: Radiology

## 2018-08-05 ENCOUNTER — Ambulatory Visit: Payer: BLUE CROSS/BLUE SHIELD | Admitting: Podiatry

## 2018-08-05 DIAGNOSIS — L600 Ingrowing nail: Secondary | ICD-10-CM

## 2018-08-05 DIAGNOSIS — M722 Plantar fascial fibromatosis: Secondary | ICD-10-CM | POA: Diagnosis not present

## 2018-08-05 MED ORDER — MELOXICAM 15 MG PO TABS: 15.0000 mg | ORAL_TABLET | Freq: Every day | ORAL | 1 refills | Status: AC

## 2018-08-07 NOTE — Progress Notes (Signed)
   Subjective: 43 year old male presenting today with a chief complaint of an ingrown nail of the medial border of the right hallux that has been present for the past two weeks. He reports a h/o ingrown nails and normally gets pedicures to treat them. Wearing shoes normally exacerbates his symptoms. He denies any pain at this time. He also notes a h/o plantar fasciitis. He states he is a newly diagnosed diabetic. Patient is here for further evaluation and treatment.   Past Medical History:  Diagnosis Date  . Allergy   . GERD (gastroesophageal reflux disease)   . Hernia   . Hyperlipidemia      Objective: Physical Exam General: The patient is alert and oriented x3 in no acute distress.  Dermatology: Skin is warm, dry and supple bilateral lower extremities. Negative for open lesions or macerations bilateral.   Vascular: Dorsalis Pedis and Posterior Tibial pulses palpable bilateral.  Capillary fill time is immediate to all digits.  Neurological: Epicritic and protective threshold intact bilateral.   Musculoskeletal: Tenderness to palpation to the plantar aspect of the right heel along the plantar fascia. All other joints range of motion within normal limits bilateral. Strength 5/5 in all groups bilateral.   Assessment: 1. Plantar fasciitis right 2. Ingrown nail medial border right hallux - currently asymptomatic   Plan of Care:  1. Patient evaluated.   2. Continue conservative care of nails.  3. Appointment with Liliane Channel, Pedorthist, for custom molded orthotics.  4. Prescription for Meloxicam provided to patient. 5. Return to clinic as needed.     Edrick Kins, DPM [facility]  Dr. Edrick Kins, DPM    [address], [place] [address]                Office [phone]  Fax [phone]

## 2018-08-10 ENCOUNTER — Ambulatory Visit (INDEPENDENT_AMBULATORY_CARE_PROVIDER_SITE_OTHER): Payer: BLUE CROSS/BLUE SHIELD | Admitting: Orthotics

## 2018-08-10 DIAGNOSIS — E1165 Type 2 diabetes mellitus with hyperglycemia: Secondary | ICD-10-CM

## 2018-08-10 DIAGNOSIS — M722 Plantar fascial fibromatosis: Secondary | ICD-10-CM

## 2018-08-10 NOTE — Progress Notes (Signed)

## 2018-08-28 ENCOUNTER — Ambulatory Visit: Payer: BLUE CROSS/BLUE SHIELD | Admitting: Orthotics

## 2018-08-28 DIAGNOSIS — M722 Plantar fascial fibromatosis: Secondary | ICD-10-CM

## 2018-08-28 DIAGNOSIS — E1165 Type 2 diabetes mellitus with hyperglycemia: Secondary | ICD-10-CM

## 2018-08-28 NOTE — Progress Notes (Signed)
Patient came in today to pick up custom made foot orthotics.  The goals were accomplished and the patient reported no dissatisfaction with said orthotics.  Patient was advised of breakin period and how to report any issues. 

## 2018-08-31 ENCOUNTER — Other Ambulatory Visit: Payer: BLUE CROSS/BLUE SHIELD | Admitting: Orthotics

## 2018-09-17 ENCOUNTER — Other Ambulatory Visit: Payer: Self-pay | Admitting: Physician Assistant

## 2018-09-17 DIAGNOSIS — J301 Allergic rhinitis due to pollen: Secondary | ICD-10-CM

## 2018-09-17 DIAGNOSIS — K219 Gastro-esophageal reflux disease without esophagitis: Secondary | ICD-10-CM

## 2018-09-30 ENCOUNTER — Ambulatory Visit: Payer: BLUE CROSS/BLUE SHIELD | Admitting: Emergency Medicine

## 2018-10-14 ENCOUNTER — Other Ambulatory Visit: Payer: Self-pay

## 2018-10-14 DIAGNOSIS — E1165 Type 2 diabetes mellitus with hyperglycemia: Secondary | ICD-10-CM

## 2018-10-20 ENCOUNTER — Ambulatory Visit (INDEPENDENT_AMBULATORY_CARE_PROVIDER_SITE_OTHER): Payer: BLUE CROSS/BLUE SHIELD | Admitting: Emergency Medicine

## 2018-10-20 ENCOUNTER — Ambulatory Visit: Payer: BLUE CROSS/BLUE SHIELD | Admitting: Emergency Medicine

## 2018-10-20 ENCOUNTER — Other Ambulatory Visit: Payer: Self-pay

## 2018-10-20 DIAGNOSIS — K219 Gastro-esophageal reflux disease without esophagitis: Secondary | ICD-10-CM

## 2018-10-20 DIAGNOSIS — J301 Allergic rhinitis due to pollen: Secondary | ICD-10-CM | POA: Diagnosis not present

## 2018-10-20 DIAGNOSIS — E1165 Type 2 diabetes mellitus with hyperglycemia: Secondary | ICD-10-CM

## 2018-10-20 MED ORDER — ROSUVASTATIN CALCIUM 10 MG PO TABS: 10.0000 mg | ORAL_TABLET | Freq: Every day | ORAL | 3 refills | Status: DC

## 2018-10-20 MED ORDER — LISINOPRIL 10 MG PO TABS: 10.0000 mg | ORAL_TABLET | Freq: Every day | ORAL | 3 refills | Status: DC

## 2018-10-20 MED ORDER — METFORMIN HCL 500 MG PO TABS: 500.0000 mg | ORAL_TABLET | Freq: Two times a day (BID) | ORAL | 3 refills | Status: DC

## 2018-10-20 MED ORDER — OMEPRAZOLE 20 MG PO CPDR: 20.0000 mg | DELAYED_RELEASE_CAPSULE | Freq: Every day | ORAL | 3 refills | Status: DC

## 2018-10-20 MED ORDER — FLUTICASONE PROPIONATE 50 MCG/ACT NA SUSP: NASAL | 1 refills | Status: DC

## 2018-10-20 MED ORDER — LORATADINE 10 MG PO TABS: ORAL_TABLET | ORAL | 3 refills | Status: DC

## 2018-10-20 NOTE — Progress Notes (Signed)
Lab Results  Component Value Date   HGBA1C 6.3 (A) 07/11/2018   BP Readings from Last 3 Encounters:  07/11/18 135/88  04/21/18 119/80  12/30/17 128/90      Telemedicine Encounter- SOAP NOTE Established Patient  This telephone encounter was conducted with the patient's (or proxy's) verbal consent via audio telecommunications: yes/no: Yes Patient was instructed to have this encounter in a suitably private space; and to only have persons present to whom they give permission to participate. In addition, patient identity was confirmed by use of name plus two identifiers (DOB and address).  I discussed the limitations, risks, security and privacy concerns of performing an evaluation and management service by telephone and the availability of in person appointments. I also discussed with the patient that there may be a patient responsible charge related to this service. The patient expressed understanding and agreed to proceed.  I spent a total of TIME; 0 MIN TO 60 MIN: 15 minutes talking with the patient or their proxy.  No chief complaint on file. Diabetes follow-up.  Subjective   Dominic [name] is a 43 y.o. male established patient. Telephone visit today for follow-up on diabetes.  Patient states he is doing well.  Losing weight.  Average glucose at home is 113.  He feels well.  Has no complaints or medical concerns today.  Compliant with medications he is taking metformin lisinopril Crestor and allergy medications, Flonase and Zyrtec.  HPI   Patient Active Problem List   Diagnosis Date Noted  . Type 2 diabetes mellitus with hyperglycemia, without long-term current use of insulin ([place]) 04/21/2018  . OSA on CPAP 01/15/2016  . Seasonal allergic rhinitis due to pollen 06/06/2015  . Esophageal reflux 06/06/2015  . Obesity 05/31/2014    Past Medical History:  Diagnosis Date  . Allergy   . GERD (gastroesophageal reflux disease)   . Hernia   . Hyperlipidemia     Current Outpatient  Medications  Medication Sig Dispense Refill  . blood glucose meter kit and supplies KIT Dispense based on patient and insurance preference. Use up to four times daily as directed. (FOR ICD-9 250.00, 250.01). 1 each 0  . fluticasone (FLONASE) 50 MCG/ACT nasal spray SHAKE LIQUID AND USE 2 SPRAYS IN EACH NOSTRIL DAILY 16 g 1  . lisinopril (PRINIVIL,ZESTRIL) 10 MG tablet Take 1 tablet (10 mg total) by mouth daily. 90 tablet 3  . loratadine (CLARITIN) 10 MG tablet TAKE 1 TABLET(10 MG) BY MOUTH DAILY 90 tablet 3  . metFORMIN (GLUCOPHAGE) 500 MG tablet Take 1 tablet (500 mg total) by mouth 2 (two) times daily with a meal. 180 tablet 3  . omeprazole (PRILOSEC) 20 MG capsule TAKE 1 CAPSULE(20 MG) BY MOUTH DAILY AS NEEDED 30 capsule 0  . rosuvastatin (CRESTOR) 10 MG tablet Take 1 tablet (10 mg total) by mouth daily. 90 tablet 3   No current facility-administered medications for this visit.     No Known Allergies  Social History   Socioeconomic History  . Marital status: Married    Spouse name: Not on file  . Number of children: Not on file  . Years of education: Not on file  . Highest education level: Not on file  Occupational History    Comment: Demolition work  Social Needs  . Financial resource strain: Not on file  . Food insecurity:    Worry: Not on file    Inability: Not on file  . Transportation needs:    Medical: Not on file  Non-medical: Not on file  Tobacco Use  . Smoking status: Former Smoker    Packs/day: 0.25    Years: 3.00    Pack years: 0.75    Types: Cigarettes  . Smokeless tobacco: Never Used  Substance and Sexual Activity  . 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 Drug use: No  . Sexual activity: Not on file  Lifestyle  . Physical activity:    Days per week: Not on file    Minutes per session: Not on file  . Stress: Not on file  Relationships  . Social connections:    Talks on phone: Not on file    Gets together: Not on file    Attends  religious service: Not on file    Active member of club or organization: Not on file    Attends meetings of clubs or organizations: Not on file    Relationship status: Not on file  . Intimate partner violence:    Fear of current or ex partner: Not on file    Emotionally abused: Not on file    Physically abused: Not on file    Forced sexual activity: Not on file  Other Topics Concern  . Not on file  Social History Narrative   Marital status: ; from [place]; [place] 25 years ago.      Children:  4 children; no grandchildren      Tobacco: none    Review of Systems  Constitutional: Negative.  Negative for chills and fever.  HENT: Negative.  Negative for sore throat.   Eyes: Negative.   Respiratory: Negative.  Negative for cough and shortness of breath.   Cardiovascular: Negative.  Negative for chest pain and palpitations.  Gastrointestinal: Negative for abdominal pain, diarrhea, nausea and vomiting.  Skin: Negative.   Neurological: Negative for dizziness and headaches.  Endo/Heme/Allergies: Negative.   All other systems reviewed and are negative.   Objective   Vitals as reported by the patient: None There were no vitals filed for this visit.  There are no diagnoses linked to this encounter. Dominic [name] was seen today for diabetes.  Diagnoses and all orders for this visit:  Chronic seasonal allergic rhinitis due to pollen -     fluticasone (FLONASE) 50 MCG/ACT nasal spray; SHAKE LIQUID AND USE 2 SPRAYS IN EACH NOSTRIL DAILY  Type 2 diabetes mellitus with hyperglycemia, without long-term current use of insulin ([facility]) -     lisinopril (PRINIVIL,ZESTRIL) 10 MG tablet; Take 1 tablet (10 mg total) by mouth daily. -     metFORMIN (GLUCOPHAGE) 500 MG tablet; Take 1 tablet (500 mg total) by mouth 2 (two) times daily with a meal. -     rosuvastatin (CRESTOR) 10 MG tablet; Take 1 tablet (10 mg total) by mouth daily.  Allergic rhinitis due to pollen -     loratadine (CLARITIN) 10 MG tablet; TAKE  1 TABLET(10 MG) BY MOUTH DAILY  Gastroesophageal reflux disease, esophagitis presence not specified -     omeprazole (PRILOSEC) 20 MG capsule; Take 1 capsule (20 mg total) by mouth daily.   Clinically stable.  No medical concerns identified at this time. Labs next week. Follow-up in 3 months.  I discussed the assessment and treatment plan with the patient. The patient was provided an opportunity to ask questions and all were answered. The patient agreed with the plan and demonstrated an understanding of the instructions.   The patient was advised to call back or seek an  in-person evaluation if the symptoms worsen or if the condition fails to improve as anticipated.  I provided 15 minutes of non-face-to-face time during this encounter.  Horald Pollen, MD  [facility] at [facility]

## 2018-11-03 ENCOUNTER — Ambulatory Visit (INDEPENDENT_AMBULATORY_CARE_PROVIDER_SITE_OTHER): Payer: BLUE CROSS/BLUE SHIELD | Admitting: Family Medicine

## 2018-11-03 ENCOUNTER — Other Ambulatory Visit: Payer: Self-pay

## 2018-11-03 DIAGNOSIS — E1165 Type 2 diabetes mellitus with hyperglycemia: Secondary | ICD-10-CM | POA: Diagnosis not present

## 2018-11-04 LAB — COMPREHENSIVE METABOLIC PANEL
ALT: 44 IU/L (ref 0–44)
AST: 29 IU/L (ref 0–40)
Albumin/Globulin Ratio: 1.7 (ref 1.2–2.2)
Albumin: 4.6 g/dL (ref 4.0–5.0)
Alkaline Phosphatase: 75 IU/L (ref 39–117)
BUN/Creatinine Ratio: 12 (ref 9–20)
BUN: 8 mg/dL (ref 6–24)
Bilirubin Total: 1.4 mg/dL — ABNORMAL HIGH (ref 0.0–1.2)
CO2: 21 mmol/L (ref 20–29)
Calcium: 9.4 mg/dL (ref 8.7–10.2)
Chloride: 104 mmol/L (ref 96–106)
Creatinine, Ser: 0.66 mg/dL — ABNORMAL LOW (ref 0.76–1.27)
GFR calc Af Amer: 136 mL/min/{1.73_m2} (ref >59)
GFR calc non Af Amer: 118 mL/min/{1.73_m2} (ref >59)
Globulin, Total: 2.7 g/dL (ref 1.5–4.5)
Glucose: 94 mg/dL (ref 65–99)
Potassium: 4.3 mmol/L (ref 3.5–5.2)
Sodium: 141 mmol/L (ref 134–144)
Total Protein: 7.3 g/dL (ref 6.0–8.5)

## 2018-11-04 LAB — LIPID PANEL
Chol/HDL Ratio: 4.9 ratio (ref 0.0–5.0)
Cholesterol, Total: 168 mg/dL (ref 100–199)
HDL: 34 mg/dL — ABNORMAL LOW (ref >39)
LDL Calculated: 101 mg/dL — ABNORMAL HIGH (ref 0–99)
Triglycerides: 166 mg/dL — ABNORMAL HIGH (ref 0–149)
VLDL Cholesterol Cal: 33 mg/dL (ref 5–40)

## 2018-11-04 LAB — HEMOGLOBIN A1C
Est. average glucose Bld gHb Est-mCnc: 117 mg/dL
Hgb A1c MFr Bld: 5.7 % — ABNORMAL HIGH (ref 4.8–5.6)

## 2018-11-05 ENCOUNTER — Encounter: Payer: Self-pay | Admitting: Emergency Medicine

## 2018-11-06 ENCOUNTER — Encounter: Payer: Self-pay | Admitting: Emergency Medicine

## 2018-12-08 DIAGNOSIS — M5417 Radiculopathy, lumbosacral region: Secondary | ICD-10-CM | POA: Diagnosis not present

## 2018-12-08 DIAGNOSIS — M7912 Myalgia of auxiliary muscles, head and neck: Secondary | ICD-10-CM | POA: Diagnosis not present

## 2018-12-08 DIAGNOSIS — M5413 Radiculopathy, cervicothoracic region: Secondary | ICD-10-CM | POA: Diagnosis not present

## 2018-12-08 DIAGNOSIS — M546 Pain in thoracic spine: Secondary | ICD-10-CM | POA: Diagnosis not present

## 2019-01-18 ENCOUNTER — Other Ambulatory Visit: Payer: Self-pay

## 2019-01-18 ENCOUNTER — Telehealth (INDEPENDENT_AMBULATORY_CARE_PROVIDER_SITE_OTHER): Payer: BC Managed Care – PPO | Admitting: Emergency Medicine

## 2019-01-18 ENCOUNTER — Telehealth: Payer: Self-pay | Admitting: *Deleted

## 2019-01-18 ENCOUNTER — Encounter: Payer: Self-pay | Admitting: Emergency Medicine

## 2019-01-18 VITALS — BP 130/84 | Wt 255.0 lb

## 2019-01-18 DIAGNOSIS — E1165 Type 2 diabetes mellitus with hyperglycemia: Secondary | ICD-10-CM | POA: Diagnosis not present

## 2019-01-18 DIAGNOSIS — I10 Essential (primary) hypertension: Secondary | ICD-10-CM

## 2019-01-18 DIAGNOSIS — K219 Gastro-esophageal reflux disease without esophagitis: Secondary | ICD-10-CM

## 2019-01-18 DIAGNOSIS — E785 Hyperlipidemia, unspecified: Secondary | ICD-10-CM

## 2019-01-18 MED ORDER — ROSUVASTATIN CALCIUM 10 MG PO TABS: 10.0000 mg | ORAL_TABLET | Freq: Every day | ORAL | 3 refills | Status: DC

## 2019-01-18 MED ORDER — OMEPRAZOLE 20 MG PO CPDR: 20.0000 mg | DELAYED_RELEASE_CAPSULE | Freq: Every day | ORAL | 3 refills | Status: DC

## 2019-01-18 MED ORDER — METFORMIN HCL 500 MG PO TABS: 500.0000 mg | ORAL_TABLET | Freq: Two times a day (BID) | ORAL | 3 refills | Status: DC

## 2019-01-18 MED ORDER — LISINOPRIL 10 MG PO TABS: 10.0000 mg | ORAL_TABLET | Freq: Every day | ORAL | 3 refills | Status: DC

## 2019-01-18 NOTE — Telephone Encounter (Signed)
Called patient to triage for appointment at 8:40 am. Left message in mobile voice mail to call back.

## 2019-01-18 NOTE — Progress Notes (Signed)
BP Readings from Last 3 Encounters:  01/18/19 130/84  07/11/18 135/88  04/21/18 119/80   Lab Results  Component Value Date   HGBA1C 5.7 (H) 11/03/2018      Telemedicine Encounter- SOAP NOTE Established Patient  This telephone encounter was conducted with the patient's (or proxy's) verbal consent via audio telecommunications: yes/no: Yes Patient was instructed to have this encounter in a suitably private space; and to only have persons present to whom they give permission to participate. In addition, patient identity was confirmed by use of name plus two identifiers (DOB and address).  I discussed the limitations, risks, security and privacy concerns of performing an evaluation and management service by telephone and the availability of in person appointments. I also discussed with the patient that there may be a patient responsible charge related to this service. The patient expressed understanding and agreed to proceed.  I spent a total of TIME; 0 MIN TO 60 MIN: 10 minutes talking with the patient or their proxy.  No chief complaint on file. Diabetes and hypertension follow-up  Subjective   Dominic [name] is a 43 y.o. male established patient. Telephone visit today for follow-up of diabetes and hypertension.  Compliant with medications.  Has no complaints or medical concerns.  Blood pressure at home 130/84.  Blood glucose at home 110.  Asymptomatic.  Needs medication refills.  HPI   Patient Active Problem List   Diagnosis Date Noted  . Type 2 diabetes mellitus with hyperglycemia, without Dominic-term current use of insulin ([place]) 04/21/2018  . OSA on CPAP 01/15/2016  . Esophageal reflux 06/06/2015  . Obesity 05/31/2014    Past Medical History:  Diagnosis Date  . Allergy   . GERD (gastroesophageal reflux disease)   . Hernia   . Hyperlipidemia     Current Outpatient Medications  Medication Sig Dispense Refill  . lisinopril (ZESTRIL) 10 MG tablet Take 1 tablet (10 mg total)  by mouth daily. 90 tablet 3  . loratadine (CLARITIN) 10 MG tablet TAKE 1 TABLET(10 MG) BY MOUTH DAILY 90 tablet 3  . metFORMIN (GLUCOPHAGE) 500 MG tablet Take 1 tablet (500 mg total) by mouth 2 (two) times daily with a meal. 180 tablet 3  . omeprazole (PRILOSEC) 20 MG capsule Take 1 capsule (20 mg total) by mouth daily. 90 capsule 3  . rosuvastatin (CRESTOR) 10 MG tablet Take 1 tablet (10 mg total) by mouth daily. 90 tablet 3  . Accu-Chek FastClix Lancets MISC USE TO TEST BLOOD SUGAR UP TO 4 TIMES A DAY    . ACCU-CHEK GUIDE test strip USE TO TEST BLOOD SUGAR UP TO 4 TIMES A DAY    . blood glucose meter kit and supplies KIT Dispense based on patient and insurance preference. Use up to four times daily as directed. (FOR ICD-9 250.00, 250.01). 1 each 0  . fluticasone (FLONASE) 50 MCG/ACT nasal spray SHAKE LIQUID AND USE 2 SPRAYS IN EACH NOSTRIL DAILY (Patient not taking: Reported on 01/18/2019) 16 g 1   No current facility-administered medications for this visit.     No Known Allergies  Social History   Socioeconomic History  . Marital status: Married    Spouse name: Not on file  . Number of children: Not on file  . Years of education: Not on file  . Highest education level: Not on file  Occupational History    Comment: Demolition work  Social Needs  . Financial resource strain: Not on file  . Food insecurity  Worry: Not on file    Inability: Not on file  . Transportation needs    Medical: Not on file    Non-medical: Not on file  Tobacco Use  . Smoking status: Former Smoker    Packs/day: 0.25    Years: 3.00    Pack years: 0.75    Types: Cigarettes  . Smokeless tobacco: Never Used  Substance and Sexual Activity  . 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 Drug use: No  . Sexual activity: Not on file  Lifestyle  . Physical activity    Days per week: Not on file    Minutes per session: Not on file  . Stress: Not on file  Relationships  . Social Product manager on phone: Not on file    Gets together: Not on file    Attends religious service: Not on file    Active member of club or organization: Not on file    Attends meetings of clubs or organizations: Not on file    Relationship status: Not on file  . Intimate partner violence    Fear of current or ex partner: Not on file    Emotionally abused: Not on file    Physically abused: Not on file    Forced sexual activity: Not on file  Other Topics Concern  . Not on file  Social History Narrative   Marital status: ; from [place]; [place] 25 years ago.      Children:  4 children; no grandchildren      Tobacco: none    Review of Systems  Constitutional: Negative.  Negative for chills and fever.  HENT: Negative.  Negative for congestion and sore throat.   Eyes: Negative.   Respiratory: Negative.  Negative for cough and shortness of breath.   Cardiovascular: Negative.  Negative for chest pain and palpitations.  Gastrointestinal: Negative.  Negative for abdominal pain, blood in stool, diarrhea, nausea and vomiting.  Genitourinary: Negative.  Negative for dysuria.  Musculoskeletal: Negative.   Skin: Negative.  Negative for rash.  Endo/Heme/Allergies: Negative for environmental allergies. Does not bruise/bleed easily.  All other systems reviewed and are negative.   Objective   Vitals as reported by the patient: Today's Vitals   01/18/19 0823  BP: 130/84  Weight: 255 lb (115.7 kg)  Awake and oriented x3 in no respiratory distress.  Diagnoses and all orders for this visit:  Type 2 diabetes mellitus with hyperglycemia, without Dominic-term current use of insulin ([facility]) -     lisinopril (ZESTRIL) 10 MG tablet; Take 1 tablet (10 mg total) by mouth daily. -     metFORMIN (GLUCOPHAGE) 500 MG tablet; Take 1 tablet (500 mg total) by mouth 2 (two) times daily with a meal. -     rosuvastatin (CRESTOR) 10 MG tablet; Take 1 tablet (10 mg total) by mouth daily. -     CBC with Differential/Platelet;  Future -     Comprehensive metabolic panel; Future -     Hemoglobin A1c; Future -     Lipid panel; Future  Gastroesophageal reflux disease, esophagitis presence not specified -     omeprazole (PRILOSEC) 20 MG capsule; Take 1 capsule (20 mg total) by mouth daily.  Essential hypertension -     CBC with Differential/Platelet; Future -     Comprehensive metabolic panel; Future  Dyslipidemia -     Lipid panel; Future  Clinically stable.  No medical concerns identified during this  visit. Continue present medications.  No changes. Scheduled for future blood work. Office visit in 3 months.   I discussed the assessment and treatment plan with the patient. The patient was provided an opportunity to ask questions and all were answered. The patient agreed with the plan and demonstrated an understanding of the instructions.   The patient was advised to call back or seek an in-person evaluation if the symptoms worsen or if the condition fails to improve as anticipated.  I provided 10 minutes of non-face-to-face time during this encounter.  Horald Pollen, MD  Primary Care at [facility]

## 2019-01-18 NOTE — Progress Notes (Signed)
Called pt lvm for him to call us and schedule 3 month follow-up from todays visit FR

## 2019-01-18 NOTE — Progress Notes (Signed)
Called patient to triage for appointment. Patient states he needs medication refill on Lisinopril and Amlodipine. Patient states he checked his blood sugar 3 weeks ago and it was 113. I advised patient to check his blood sugar at least 1-2 times a day.

## 2019-01-22 ENCOUNTER — Ambulatory Visit: Payer: BLUE CROSS/BLUE SHIELD | Admitting: Emergency Medicine

## 2019-03-29 ENCOUNTER — Ambulatory Visit: Payer: BC Managed Care – PPO | Admitting: Emergency Medicine

## 2019-04-12 ENCOUNTER — Ambulatory Visit: Payer: BC Managed Care – PPO | Admitting: Emergency Medicine

## 2019-04-19 ENCOUNTER — Ambulatory Visit: Payer: BC Managed Care – PPO | Admitting: Emergency Medicine

## 2019-04-19 ENCOUNTER — Encounter: Payer: Self-pay | Admitting: Emergency Medicine

## 2019-04-19 ENCOUNTER — Other Ambulatory Visit: Payer: Self-pay

## 2019-04-19 VITALS — BP 145/87 | HR 49 | Temp 98.1°F | Resp 16 | Ht 69.0 in | Wt 252.8 lb

## 2019-04-19 DIAGNOSIS — E785 Hyperlipidemia, unspecified: Secondary | ICD-10-CM | POA: Diagnosis not present

## 2019-04-19 DIAGNOSIS — I152 Hypertension secondary to endocrine disorders: Secondary | ICD-10-CM

## 2019-04-19 DIAGNOSIS — I10 Essential (primary) hypertension: Secondary | ICD-10-CM

## 2019-04-19 DIAGNOSIS — E1159 Type 2 diabetes mellitus with other circulatory complications: Secondary | ICD-10-CM

## 2019-04-19 DIAGNOSIS — E1165 Type 2 diabetes mellitus with hyperglycemia: Secondary | ICD-10-CM | POA: Diagnosis not present

## 2019-04-19 DIAGNOSIS — E1169 Type 2 diabetes mellitus with other specified complication: Secondary | ICD-10-CM

## 2019-04-19 NOTE — Progress Notes (Signed)
Lab Results  Component Value Date   HGBA1C 5.7 (H) 11/03/2018   BP Readings from Last 3 Encounters:  01/18/19 130/84  07/11/18 135/88  04/21/18 119/80   Lab Results  Component Value Date   CREATININE 0.66 (L) 11/03/2018   BUN 8 11/03/2018   NA 141 11/03/2018   K 4.3 11/03/2018   CL 104 11/03/2018   CO2 21 11/03/2018   Dominic [name] 43 y.o.   Chief Complaint  Patient presents with  . Diabetes    3 month follow up with blood work    HISTORY OF PRESENT ILLNESS: This is a 43 y.o. male with history of diabetes and hypertension here for follow-up. 1.  Diabetes: On metformin 500 mg twice a day. 2.  Hypertension: On lisinopril 10 mg daily. Also on a statin, Crestor 10 mg daily. Doing well.  Has no complaints or medical concerns today. Due for blood work.  Fasting today.  HPI   Prior to Admission medications   Medication Sig Start Date End Date Taking? Authorizing Provider  lisinopril (ZESTRIL) 10 MG tablet Take 1 tablet (10 mg total) by mouth daily. 01/18/19  Yes Violetta Lavalle, Ines Bloomer, MD  metFORMIN (GLUCOPHAGE) 500 MG tablet Take 1 tablet (500 mg total) by mouth 2 (two) times daily with a meal. 01/18/19  Yes Claryssa Sandner, Ines Bloomer, MD  rosuvastatin (CRESTOR) 10 MG tablet Take 1 tablet (10 mg total) by mouth daily. 01/18/19  Yes [place], Ines Bloomer, MD  Accu-Chek FastClix Lancets MISC USE TO TEST BLOOD SUGAR UP TO 4 TIMES A DAY 07/11/18   [provider]  ACCU-CHEK GUIDE test strip USE TO TEST BLOOD SUGAR UP TO 4 TIMES A DAY 07/11/18   [provider]  blood glucose meter kit and supplies KIT Dispense based on patient and insurance preference. Use up to four times daily as directed. (FOR ICD-9 250.00, 250.01). 07/11/18   Horald Pollen, MD  fluticasone [facility]) 50 MCG/ACT nasal spray SHAKE LIQUID AND USE 2 SPRAYS IN [facility] NOSTRIL DAILY Patient not taking: Reported on 04/19/2019 10/20/18   Horald Pollen, MD  loratadine (CLARITIN) 10 MG tablet  TAKE 1 TABLET(10 MG) BY MOUTH DAILY Patient not taking: Reported on 04/19/2019 10/20/18   Horald Pollen, MD  omeprazole (PRILOSEC) 20 MG capsule Take 1 capsule (20 mg total) by mouth daily. 01/18/19 04/18/19  Horald Pollen, MD    No Known Allergies  Patient Active Problem List   Diagnosis Date Noted  . Type 2 diabetes mellitus with hyperglycemia, without long-term current use of insulin ([place]) 04/21/2018  . OSA on CPAP 01/15/2016  . Obesity 05/31/2014    Past Medical History:  Diagnosis Date  . Allergy   . GERD (gastroesophageal reflux disease)   . Hernia   . Hyperlipidemia     Past Surgical History:  Procedure Laterality Date  . GROIN DISSECTION  11/19/2011   Procedure: Virl Son EXPLORATION;  Surgeon: Harl Bowie, MD;  Location: [facility];  Service: General;  Laterality: Right;  Right Groin Exploration  . INGUINAL HERNIA REPAIR  2012   right  . INGUINAL HERNIA REPAIR  11/19/2011   Procedure: HERNIA REPAIR INGUINAL ADULT;  Surgeon: Harl Bowie, MD;  Location: [facility];  Service: General;  Laterality: Right;  excision of right groin mass    Social History   Socioeconomic History  . Marital status: Married    Spouse name: Not on file  . Number of children: Not on file  . Years of  education: Not on file  . Highest education level: Not on file  Occupational History    Comment: Demolition work  Social Needs  . Financial resource strain: Not on file  . Food insecurity    Worry: Not on file    Inability: Not on file  . Transportation needs    Medical: Not on file    Non-medical: Not on file  Tobacco Use  . Smoking status: Former Smoker    Packs/day: 0.25    Years: 3.00    Pack years: 0.75    Types: Cigarettes  . Smokeless tobacco: Never Used  Substance and Sexual Activity  . 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 Drug use: No  . Sexual activity: Not on file  Lifestyle  . Physical activity    Days per week: Not on file     Minutes per session: Not on file  . Stress: Not on file  Relationships  . Social Herbalist on phone: Not on file    Gets together: Not on file    Attends religious service: Not on file    Active member of club or organization: Not on file    Attends meetings of clubs or organizations: Not on file    Relationship status: Not on file  . Intimate partner violence    Fear of current or ex partner: Not on file    Emotionally abused: Not on file    Physically abused: Not on file    Forced sexual activity: Not on file  Other Topics Concern  . Not on file  Social History Narrative   Marital status: ; from [place]; [place] 25 years ago.      Children:  4 children; no grandchildren      Tobacco: none    Family History  Problem Relation Age of Onset  . Hypertension Mother   . Diabetes Mother   . Diabetes Father      Review of Systems  Constitutional: Negative.  Negative for chills and fever.  HENT: Negative.  Negative for congestion and sore throat.   Respiratory: Negative.  Negative for cough and shortness of breath.   Cardiovascular: Negative.  Negative for chest pain.  Gastrointestinal: Negative.  Negative for abdominal pain, diarrhea, nausea and vomiting.  Genitourinary: Negative.   Musculoskeletal: Negative.   Skin: Negative.  Negative for rash.  Neurological: Negative for dizziness and headaches.  All other systems reviewed and are negative.   Vitals:   04/19/19 0853  BP: (!) 145/87  Pulse: (!) 49  Resp: 16  Temp: 98.1 F (36.7 C)  SpO2: 96%    Physical Exam Vitals signs reviewed.  Constitutional:      Appearance: Normal appearance.  HENT:     Head: Normocephalic.  Eyes:     Extraocular Movements: Extraocular movements intact.     Conjunctiva/sclera: Conjunctivae normal.     Pupils: Pupils are equal, round, and reactive to light.  Neck:     Musculoskeletal: Normal range of motion and neck supple.  Cardiovascular:     Rate and Rhythm: Normal rate and  regular rhythm.     Pulses: Normal pulses.     Heart sounds: Normal heart sounds.  Pulmonary:     Effort: Pulmonary effort is normal.     Breath sounds: Normal breath sounds.  Abdominal:     Palpations: Abdomen is soft.     Tenderness: There is no abdominal tenderness.  Musculoskeletal: Normal  range of motion.  Skin:    General: Skin is warm and dry.     Capillary Refill: Capillary refill takes less than 2 seconds.  Neurological:     General: No focal deficit present.     Mental Status: He is alert and oriented to person, place, and time.  Psychiatric:        Mood and Affect: Mood normal.        Behavior: Behavior normal.     A total of 25 minutes was spent in the room with the patient, greater than 50% of which was in counseling/coordination of care regarding chronic medical conditions including diabetes hypertension and dyslipidemia, cardiovascular risks associated with these, diet and nutrition, importance of physical activity, medications and side effects including hypoglycemia, prognosis, review of most recent blood work, and need for follow-up.   ASSESSMENT & PLAN: Clinically stable.  No medical concerns identified during this visit.  Continue present medication.  No changes.  Follow-up in 6 months.  Saber was seen today for diabetes.  Diagnoses and all orders for this visit:  Hypertension associated with diabetes ([place]) -     HM Diabetes Foot Exam  Type 2 diabetes mellitus with hyperglycemia, without long-term current use of insulin ([facility]) -     Lipid panel -     Hemoglobin A1c -     Comprehensive metabolic panel -     CBC with Differential/Platelet  Dyslipidemia -     Lipid panel  Essential hypertension -     Comprehensive metabolic panel -     CBC with Differential/Platelet  Dyslipidemia associated with type 2 diabetes mellitus ([place])    Patient Instructions       If you have lab work done today you will be contacted with your lab results within the next  2 weeks.  If you have not heard from [name] then please contact us. The fastest way to get your results is to register for My Chart.   IF you received an x-ray today, you will receive an invoice from [facility]. Please contact [facility] at [phone] with questions or concerns regarding your invoice.   IF you received labwork today, you will receive an invoice from [facility]. Please contact [facility] at [phone] with questions or concerns regarding your invoice.   Our billing staff will not be able to assist you with questions regarding bills from these companies.  You will be contacted with the lab results as soon as they are available. The fastest way to get your results is to activate your My Chart account. Instructions are located on the last page of this paperwork. If you have not heard from [name] regarding the results in 2 weeks, please contact this office.     Diabetes mellitus y nutricin, en adultos Diabetes Mellitus and Nutrition, Adult Si sufre de diabetes (diabetes mellitus), es muy importante tener hbitos alimenticios saludables debido a que sus niveles de Designer, television/film set sangre (glucosa) se ven afectados en gran medida por lo que come y bebe. [place] alimentos saludables en las cantidades [place], aproximadamente a la [employer], [place] ayudar a:  Aeronautical engineer glucemia.  Disminuir el riesgo de sufrir una enfermedad cardaca.  Mejorar la presin arterial.  Science writer o mantener un peso saludable. Todas las personas que sufren de diabetes son diferentes y cada una tiene necesidades diferentes en cuanto a un plan de alimentacin. El mdico puede recomendarle que trabaje con un especialista en dietas y nutricin (nutricionista) para Paediatric nurse  el mejor plan para usted. Su plan de alimentacin puede variar segn factores como:  Las caloras que necesita.  Los medicamentos que toma.  Su peso.  Sus niveles de glucemia, presin arterial y colesterol.   Su nivel de [place].  Otras afecciones que tenga, como enfermedades cardacas o renales. Cmo me afectan los carbohidratos? Los carbohidratos, o hidratos de carbono, afectan su nivel de glucemia ms que cualquier otro tipo de alimento. La ingesta de carbohidratos naturalmente aumenta la cantidad de [employer]. El recuento de carbohidratos es un mtodo destinado a Catering manager un registro de la cantidad de carbohidratos que se consumen. El recuento de carbohidratos es importante para Theatre manager la glucemia a un nivel saludable, especialmente si utiliza insulina o toma determinados medicamentos por va oral para la diabetes. Es importante conocer la cantidad de carbohidratos que se pueden ingerir en cada comida sin correr Engineer, manufacturing. Esto es Psychologist, forensic. Su nutricionista puede ayudarlo a calcular la cantidad de carbohidratos que debe ingerir en cada comida y en cada refrigerio. Entre los alimentos que contienen carbohidratos, se incluyen:  Pan, cereal, arroz, pastas y galletas.  Papas y maz.  Guisantes, frijoles y lentejas.  [place]ec[place] y Estate agent.  Lambert Mody y [place].  Postres, como pasteles, galletas, helado y caramelos. Cmo me afecta el alcohol? El alcohol puede provocar disminuciones sbitas de la glucemia (hipoglucemia), especialmente si utiliza insulina o toma determinados medicamentos por va oral para la diabetes. La hipoglucemia es una afeccin potencialmente mortal. Los sntomas de la hipoglucemia (somnolencia, mareos y confusin) son similares a los sntomas de haber consumido demasiado alcohol. Si el mdico afirma que el alcohol es seguro para usted, [place] estas pautas:  Limite el consumo de alcohol a no ms de 69mdida por da si es mujer y no est [place] y a 25midas si es hombre. Una medida equivale a 12oz (35538mde cerveza, 5oz (148m18me vino o 1oz (44ml38m bebidas alcohlicas de alta graduacin.  No beba con el estmago vaco.  Mantngase hidratado  bebiendo agua, refrescos dietticos o t helado sin azcar.  Tenga en cuenta que los refrescos comunes, los jugos y otras bebida para mezclOptician, dispensingen contener mucha azcar y se deben contar como carbohidratos. Cules son algunos consejos para seguir este plan?  Leer las etiquetas de los alimentos  Comience por leer el tamao de la porcin en la "Informacin nutricional" en las etiquetas de los alimentos envasados y las bebidas. La cantidad de caloras, carbohidratos, grasas y otros nutrientes mencionados en la etiqueta se basan en una porcin del alimento. Muchos alimentos contienen ms de una porcin por envase.  Verifique la cantidad total de gramos (g) de carbohidratos totales en una porcin. Puede calcular la cantidad de porciones de carbohidratos al dividir el total de carbohidratos por 15. Por ejemplo, si un alimento tiene un total de 30g de carbohidratos, equivale a 2 porciones de carbohidratos.  Verifique la cantidad de gramos (g) de grasas saturadas y grasas trans en una porcin. Escoja alimentos que no contengan grasa o que tengan un bajo contenido.  Verifique la cantidad de miligramos (mg) de sal (sodio) en una porcin. La mayorState Farmas personas deben limitar la ingesta de sodio total a menos de 2300mg 81mda.  STraining and development officermpre consulte la informacin nutricional de los alimentos etiquetados como "con bajo contenido de grasa" o "sin grasa". Estos alimentos pueden tener un mayor contenido de azcar Location managerada o carbohidratos refinados, y deben evitarse.  Hable con su nutricionista para identificar sus objetivos diarios  en cuanto a los nutrientes mencionados en la etiqueta. Al ir de compras  Evite comprar alimentos procesados, enlatados o precocinados. Estos alimentos tienden a Special educational needs teacher mayor cantidad de [place], sodio y azcar agregada.  Compre en la zona exterior de la tienda de comestibles. Esta zona incluye frutas y verduras frescas, granos a granel, carnes frescas y productos lcteos  frescos. Al cocinar  Utilice mtodos de coccin a baja temperatura, como hornear, en lugar de mtodos de coccin a alta temperatura, como frer en abundante aceite.  Cocine con aceites saludables, como el aceite de [place], canola o [place].  Evite cocinar con manteca, crema o carnes con alto contenido de grasa. Planificacin de las comidas  Coma las comidas y los refrigerios regularmente, preferentemente a la misma hora todos [place]. Evite pasar largos perodos de tiempo sin comer.  Consuma alimentos ricos en fibra, como frutas frescas, verduras, frijoles y cereales integrales. Consulte a su nutricionista sobre cuntas porciones de carbohidratos puede consumir en cada comida.  Consuma entre 4 y 6 onzas (oz) de protenas magras por da, como carnes [place], pollo, pescado, huevos o tofu. Una onza de protena magra equivale a: ? 1 onza de carne, pollo o pescado. ? 1huevo. ?  taza de tofu.  Coma algunos alimentos por da que contengan grasas saludables, como aguacates, frutos secos, semillas y pescado. Estilo de vida  Controle su nivel de glucemia con regularidad.  Haga actividad fsica habitualmente como se lo haya indicado el mdico. Esto puede incluir lo siguiente: ? 144mnutos semanales de ejercicio de intensidad moderada o alta. Esto podra incluir caminatas dinmicas, ciclismo o gimnasia acutica. ? Realizar ejercicios de elongacin y de fortalecimiento, como yoga o levantamiento de pesas, por lo menos 2veces por semana.  Tome los mTenneco Incse lo haya indicado el mdico.  No consuma ningn producto que contenga nicotina o tabaco, como cigarrillos y cPsychologist, sport and exercise Si necesita ayuda para dejar de fumar, consulte al mHess Corporationcon un asesor o instructor en diabetes para identificar estrategias para controlar el estrs y cualquier desafo emocional y social. Preguntas para hacerle al mdico  Es necesario que consulte a uRadio broadcast assistanten el cuidado de la  diabetes?  Es necesario que me rena con un nutricionista?  A qu nmero puedo llamar si tengo preguntas?  Cules son los mejores momentos para controlar la glucemia? Dnde encontrar ms informacin:  Asociacin Estadounidense de la Diabetes (American Diabetes Association): diabetes.org  Academia de Nutricin y DInformation systems manager(Academy of Nutrition and Dietetics): www.eatright.oDarienDiabetes y las Enfermedades Digestivas y Renales ([facility] Dentonof Diabetes and Digestive and Kidney Diseases, NIH): wDesMoinesFuneral.dkResumen  Un plan de alimentacin saludable lo ayudar a cAeronautical engineerglucemia y mTheatre managerun estilo de vida saludable.  Trabajar con un especialista en dietas y nutricin (nutricionista) puede ayudarlo a eInsurance claims handlerde alimentacin para usted.  Tenga en cuenta que los carbohidratos (hidratos de carbono) y el alcohol tienen efectos inmediatos en sus niveles de glucemia. Es importante contar los carbohidratos que ingiere y consumir alcohol con prudencia. Esta informacin no tiene cMarine scientistel consejo del mdico. Asegrese de hacerle al mdico cualquier pregunta que tenga. Document Released: 10/19/2007 Document Revised: 03/22/2017 Document Reviewed: 11/01/2016 Elsevier Patient Education  2020 Elsevier Inc.      MAgustina Caroli MD Urgent MApalachinGroup

## 2019-04-19 NOTE — Patient Instructions (Addendum)
   If you have lab work done today you will be contacted with your lab results within the next 2 weeks.  If you have not heard from us then please contact us. The fastest way to get your results is to register for My Chart.   IF you received an x-ray today, you will receive an invoice from [facility]. Please contact [facility] at [phone] with questions or concerns regarding your invoice.   IF you received labwork today, you will receive an invoice from [facility]. Please contact [facility] at [phone] with questions or concerns regarding your invoice.   Our billing staff will not be able to assist you with questions regarding bills from these companies.  You will be contacted with the lab results as soon as they are available. The fastest way to get your results is to activate your My Chart account. Instructions are located on the last page of this paperwork. If you have not heard from us regarding the results in 2 weeks, please contact this office.     Diabetes mellitus y nutricin, en adultos Diabetes Mellitus and Nutrition, Adult Si sufre de diabetes (diabetes mellitus), es muy importante tener hbitos alimenticios saludables debido a que sus niveles de azcar en la sangre (glucosa) se ven afectados en gran medida por lo que come y bebe. [place]er alimentos saludables en las cantidades adecuadas, aproximadamente a la misma hora todos los das, lo ayudar a:  Controlar la glucemia.  Disminuir el riesgo de sufrir una enfermedad cardaca.  Mejorar la presin arterial.  Alcanzar o mantener un peso saludable. Todas las personas que sufren de diabetes son diferentes y cada una tiene necesidades diferentes en cuanto a un plan de alimentacin. El mdico puede recomendarle que trabaje con un especialista en dietas y nutricin (nutricionista) para elaborar el mejor plan para usted. Su plan de alimentacin puede variar segn factores como:  Las caloras que  necesita.  Los medicamentos que toma.  Su peso.  Sus niveles de glucemia, presin arterial y colesterol.  Su nivel de actividad.  Otras afecciones que tenga, como enfermedades cardacas o renales. Cmo me afectan los carbohidratos? Los carbohidratos, o hidratos de carbono, afectan su nivel de glucemia ms que cualquier otro tipo de alimento. La ingesta de carbohidratos naturalmente aumenta la cantidad de glucosa en la sangre. El recuento de carbohidratos es un mtodo destinado a llevar un registro de la cantidad de carbohidratos que se consumen. El recuento de carbohidratos es importante para mantener la glucemia a un nivel saludable, especialmente si utiliza insulina o toma determinados medicamentos por va oral para la diabetes. Es importante conocer la cantidad de carbohidratos que se pueden ingerir en cada comida sin correr ningn riesgo. Esto es diferente en cada persona. Su nutricionista puede ayudarlo a calcular la cantidad de carbohidratos que debe ingerir en cada comida y en cada refrigerio. Entre los alimentos que contienen carbohidratos, se incluyen:  Pan, cereal, arroz, pastas y galletas.  Papas y maz.  Guisantes, frijoles y lentejas.  Leche y yogur.  Frutas y jugo.  Postres, como pasteles, galletas, helado y caramelos. Cmo me afecta el alcohol? El alcohol puede provocar disminuciones sbitas de la glucemia (hipoglucemia), especialmente si utiliza insulina o toma determinados medicamentos por va oral para la diabetes. La hipoglucemia es una afeccin potencialmente mortal. Los sntomas de la hipoglucemia (somnolencia, mareos y confusin) son similares a los sntomas de haber consumido demasiado alcohol. Si el mdico afirma que el alcohol es seguro para usted, siga estas pautas:    Limite el consumo de alcohol a no ms de 1medida por da si es mujer y no est embarazada, y a 2medidas si es hombre. Una medida equivale a 12oz (355ml) de cerveza, 5oz (148ml) de vino o  1oz (44ml) de bebidas alcohlicas de alta graduacin.  No beba con el estmago vaco.  Mantngase hidratado bebiendo agua, refrescos dietticos o t helado sin azcar.  Tenga en cuenta que los refrescos comunes, los jugos y otras bebida para mezclar pueden contener mucha azcar y se deben contar como carbohidratos. Cules son algunos consejos para seguir este plan?  Leer las etiquetas de los alimentos  Comience por leer el tamao de la porcin en la "Informacin nutricional" en las etiquetas de los alimentos envasados y las bebidas. La cantidad de caloras, carbohidratos, grasas y otros nutrientes mencionados en la etiqueta se basan en una porcin del alimento. Muchos alimentos contienen ms de una porcin por envase.  Verifique la cantidad total de gramos (g) de carbohidratos totales en una porcin. Puede calcular la cantidad de porciones de carbohidratos al dividir el total de carbohidratos por 15. Por ejemplo, si un alimento tiene un total de 30g de carbohidratos, equivale a 2 porciones de carbohidratos.  Verifique la cantidad de gramos (g) de grasas saturadas y grasas trans en una porcin. Escoja alimentos que no contengan grasa o que tengan un bajo contenido.  Verifique la cantidad de miligramos (mg) de sal (sodio) en una porcin. La mayora de las personas deben limitar la ingesta de sodio total a menos de 2300mg por da.  Siempre consulte la informacin nutricional de los alimentos etiquetados como "con bajo contenido de grasa" o "sin grasa". Estos alimentos pueden tener un mayor contenido de azcar agregada o carbohidratos refinados, y deben evitarse.  Hable con su nutricionista para identificar sus objetivos diarios en cuanto a los nutrientes mencionados en la etiqueta. Al ir de compras  Evite comprar alimentos procesados, enlatados o precocinados. Estos alimentos tienden a tener una mayor cantidad de grasa, sodio y azcar agregada.  Compre en la zona exterior de la tienda de  comestibles. Esta zona incluye frutas y verduras frescas, granos a granel, carnes frescas y productos lcteos frescos. Al cocinar  Utilice mtodos de coccin a baja temperatura, como hornear, en lugar de mtodos de coccin a alta temperatura, como frer en abundante aceite.  Cocine con aceites saludables, como el aceite de oliva, canola o girasol.  Evite cocinar con manteca, crema o carnes con alto contenido de grasa. Planificacin de las comidas  Coma las comidas y los refrigerios regularmente, preferentemente a la misma hora todos los das. Evite pasar largos perodos de tiempo sin comer.  Consuma alimentos ricos en fibra, como frutas frescas, verduras, frijoles y cereales integrales. Consulte a su nutricionista sobre cuntas porciones de carbohidratos puede consumir en cada comida.  Consuma entre 4 y 6 onzas (oz) de protenas magras por da, como carnes magras, pollo, pescado, huevos o tofu. Una onza de protena magra equivale a: ? 1 onza de carne, pollo o pescado. ? 1huevo. ?  taza de tofu.  Coma algunos alimentos por da que contengan grasas saludables, como aguacates, frutos secos, semillas y pescado. Estilo de vida  Controle su nivel de glucemia con regularidad.  Haga actividad fsica habitualmente como se lo haya indicado el mdico. Esto puede incluir lo siguiente: ? 150minutos semanales de ejercicio de intensidad moderada o alta. Esto podra incluir caminatas dinmicas, ciclismo o gimnasia acutica. ? Realizar ejercicios de elongacin y de fortalecimiento, como yoga o levantamiento   de pesas, por lo menos 2veces por semana.  Tome los medicamentos como se lo haya indicado el mdico.  No consuma ningn producto que contenga nicotina o tabaco, como cigarrillos y cigarrillos electrnicos. Si necesita ayuda para dejar de fumar, consulte al mdico.  Trabaje con un asesor o instructor en diabetes para identificar estrategias para controlar el estrs y cualquier desafo emocional  y social. Preguntas para hacerle al mdico  Es necesario que consulte a un instructor en el cuidado de la diabetes?  Es necesario que me rena con un nutricionista?  A qu nmero puedo llamar si tengo preguntas?  Cules son los mejores momentos para controlar la glucemia? Dnde encontrar ms informacin:  Asociacin Estadounidense de la Diabetes (American Diabetes Association): diabetes.org  Academia de Nutricin y Diettica (Academy of Nutrition and Dietetics): www.eatright.org  Instituto Nacional de la Diabetes y las Enfermedades Digestivas y Renales (National Institute of Diabetes and Digestive and Kidney Diseases, NIH): www.niddk.nih.gov Resumen  Un plan de alimentacin saludable lo ayudar a controlar la glucemia y mantener un estilo de vida saludable.  Trabajar con un especialista en dietas y nutricin (nutricionista) puede ayudarlo a elaborar el mejor plan de alimentacin para usted.  Tenga en cuenta que los carbohidratos (hidratos de carbono) y el alcohol tienen efectos inmediatos en sus niveles de glucemia. Es importante contar los carbohidratos que ingiere y consumir alcohol con prudencia. Esta informacin no tiene como fin reemplazar el consejo del mdico. Asegrese de hacerle al mdico cualquier pregunta que tenga. Document Released: 10/19/2007 Document Revised: 03/22/2017 Document Reviewed: 11/01/2016 Elsevier Patient Education  2020 Elsevier Inc.  

## 2019-04-20 LAB — COMPREHENSIVE METABOLIC PANEL
ALT: 75 IU/L — ABNORMAL HIGH (ref 0–44)
AST: 47 IU/L — ABNORMAL HIGH (ref 0–40)
Albumin/Globulin Ratio: 1.5 (ref 1.2–2.2)
Albumin: 4.5 g/dL (ref 4.0–5.0)
Alkaline Phosphatase: 86 IU/L (ref 39–117)
BUN/Creatinine Ratio: 14 (ref 9–20)
BUN: 10 mg/dL (ref 6–24)
Bilirubin Total: 1.7 mg/dL — ABNORMAL HIGH (ref 0.0–1.2)
CO2: 23 mmol/L (ref 20–29)
Calcium: 9.4 mg/dL (ref 8.7–10.2)
Chloride: 104 mmol/L (ref 96–106)
Creatinine, Ser: 0.73 mg/dL — ABNORMAL LOW (ref 0.76–1.27)
GFR calc Af Amer: 130 mL/min/{1.73_m2} (ref >59)
GFR calc non Af Amer: 112 mL/min/{1.73_m2} (ref >59)
Globulin, Total: 3 g/dL (ref 1.5–4.5)
Glucose: 106 mg/dL — ABNORMAL HIGH (ref 65–99)
Potassium: 4.1 mmol/L (ref 3.5–5.2)
Sodium: 139 mmol/L (ref 134–144)
Total Protein: 7.5 g/dL (ref 6.0–8.5)

## 2019-04-20 LAB — CBC WITH DIFFERENTIAL/PLATELET
Basophils Absolute: 0.1 10*3/uL (ref 0.0–0.2)
Basos: 1 %
EOS (ABSOLUTE): 0.3 10*3/uL (ref 0.0–0.4)
Eos: 5 %
Hematocrit: 48.7 % (ref 37.5–51.0)
Hemoglobin: 16.5 g/dL (ref 13.0–17.7)
Immature Grans (Abs): 0 10*3/uL (ref 0.0–0.1)
Immature Granulocytes: 0 %
Lymphocytes Absolute: 2.2 10*3/uL (ref 0.7–3.1)
Lymphs: 36 %
MCH: 30.8 pg (ref 26.6–33.0)
MCHC: 33.9 g/dL (ref 31.5–35.7)
MCV: 91 fL (ref 79–97)
Monocytes Absolute: 0.4 10*3/uL (ref 0.1–0.9)
Monocytes: 7 %
Neutrophils Absolute: 3.1 10*3/uL (ref 1.4–7.0)
Neutrophils: 51 %
Platelets: 219 10*3/uL (ref 150–450)
RBC: 5.35 x10E6/uL (ref 4.14–5.80)
RDW: 12.4 % (ref 11.6–15.4)
WBC: 6.1 10*3/uL (ref 3.4–10.8)

## 2019-04-20 LAB — HEMOGLOBIN A1C
Est. average glucose Bld gHb Est-mCnc: 128 mg/dL
Hgb A1c MFr Bld: 6.1 % — ABNORMAL HIGH (ref 4.8–5.6)

## 2019-04-20 LAB — LIPID PANEL
Chol/HDL Ratio: 4.1 ratio (ref 0.0–5.0)
Cholesterol, Total: 150 mg/dL (ref 100–199)
HDL: 37 mg/dL — ABNORMAL LOW (ref >39)
LDL Chol Calc (NIH): 89 mg/dL (ref 0–99)
Triglycerides: 132 mg/dL (ref 0–149)
VLDL Cholesterol Cal: 24 mg/dL (ref 5–40)

## 2019-04-21 ENCOUNTER — Encounter: Payer: Self-pay | Admitting: Emergency Medicine

## 2019-04-23 ENCOUNTER — Encounter: Payer: Self-pay | Admitting: Emergency Medicine

## 2019-05-22 ENCOUNTER — Encounter: Payer: Self-pay | Admitting: Emergency Medicine

## 2019-05-23 ENCOUNTER — Telehealth: Payer: Self-pay | Admitting: Emergency Medicine

## 2019-05-23 NOTE — Telephone Encounter (Signed)
Spoke to patient about his positive Covid test.  He has been sick for close to 1 week and is handling this condition very well.  Clinically stable.  No red flag signs or symptoms.  Covid advice given.

## 2019-06-04 ENCOUNTER — Other Ambulatory Visit: Payer: Self-pay

## 2019-06-04 DIAGNOSIS — Z20822 Contact with and (suspected) exposure to covid-19: Secondary | ICD-10-CM

## 2019-06-05 LAB — NOVEL CORONAVIRUS, NAA: SARS-CoV-2, NAA: NOT DETECTED

## 2019-06-20 ENCOUNTER — Other Ambulatory Visit: Payer: Self-pay | Admitting: Emergency Medicine

## 2019-06-20 DIAGNOSIS — E1165 Type 2 diabetes mellitus with hyperglycemia: Secondary | ICD-10-CM

## 2019-08-20 ENCOUNTER — Encounter: Payer: Self-pay | Admitting: Emergency Medicine

## 2019-08-21 ENCOUNTER — Other Ambulatory Visit: Payer: Self-pay | Admitting: Emergency Medicine

## 2019-08-21 DIAGNOSIS — J301 Allergic rhinitis due to pollen: Secondary | ICD-10-CM

## 2019-08-21 MED ORDER — FLUTICASONE PROPIONATE 50 MCG/ACT NA SUSP: NASAL | 1 refills | Status: DC

## 2019-08-21 MED ORDER — LORATADINE 10 MG PO TABS: ORAL_TABLET | ORAL | 3 refills | Status: DC

## 2019-08-21 MED ORDER — CLOTRIMAZOLE-BETAMETHASONE 1-0.05 % EX CREA: 1.0000 "application " | TOPICAL_CREAM | Freq: Two times a day (BID) | CUTANEOUS | 2 refills | Status: DC

## 2019-09-20 ENCOUNTER — Encounter: Payer: Self-pay | Admitting: Emergency Medicine

## 2019-09-20 ENCOUNTER — Other Ambulatory Visit: Payer: Self-pay

## 2019-09-20 ENCOUNTER — Ambulatory Visit: Payer: BC Managed Care – PPO | Admitting: Emergency Medicine

## 2019-09-20 VITALS — BP 132/86 | HR 68 | Temp 98.5°F | Resp 16 | Ht 69.0 in | Wt 247.0 lb

## 2019-09-20 DIAGNOSIS — E785 Hyperlipidemia, unspecified: Secondary | ICD-10-CM | POA: Diagnosis not present

## 2019-09-20 DIAGNOSIS — E1159 Type 2 diabetes mellitus with other circulatory complications: Secondary | ICD-10-CM | POA: Diagnosis not present

## 2019-09-20 DIAGNOSIS — I10 Essential (primary) hypertension: Secondary | ICD-10-CM | POA: Diagnosis not present

## 2019-09-20 DIAGNOSIS — E1165 Type 2 diabetes mellitus with hyperglycemia: Secondary | ICD-10-CM | POA: Diagnosis not present

## 2019-09-20 DIAGNOSIS — Z6836 Body mass index (BMI) 36.0-36.9, adult: Secondary | ICD-10-CM

## 2019-09-20 DIAGNOSIS — E1169 Type 2 diabetes mellitus with other specified complication: Secondary | ICD-10-CM

## 2019-09-20 DIAGNOSIS — G4733 Obstructive sleep apnea (adult) (pediatric): Secondary | ICD-10-CM

## 2019-09-20 DIAGNOSIS — Z9989 Dependence on other enabling machines and devices: Secondary | ICD-10-CM

## 2019-09-20 DIAGNOSIS — I152 Hypertension secondary to endocrine disorders: Secondary | ICD-10-CM

## 2019-09-20 LAB — POCT GLYCOSYLATED HEMOGLOBIN (HGB A1C): Hemoglobin A1C: 5.8 % — AB (ref 4.0–5.6)

## 2019-09-20 LAB — GLUCOSE, POCT (MANUAL RESULT ENTRY): POC Glucose: 106 mg/dl — AB (ref 70–99)

## 2019-09-20 NOTE — Patient Instructions (Addendum)
   If you have lab work done today you will be contacted with your lab results within the next 2 weeks.  If you have not heard from us then please contact us. The fastest way to get your results is to register for My Chart.   IF you received an x-ray today, you will receive an invoice from [facility]. Please contact [facility] at [phone] with questions or concerns regarding your invoice.   IF you received labwork today, you will receive an invoice from [facility]. Please contact [facility] at [phone] with questions or concerns regarding your invoice.   Our billing staff will not be able to assist you with questions regarding bills from these companies.  You will be contacted with the lab results as soon as they are available. The fastest way to get your results is to activate your My Chart account. Instructions are located on the last page of this paperwork. If you have not heard from us regarding the results in 2 weeks, please contact this office.     Diabetes mellitus y nutricin, en adultos Diabetes Mellitus and Nutrition, Adult Si sufre de diabetes (diabetes mellitus), es muy importante tener hbitos alimenticios saludables debido a que sus niveles de azcar en la sangre (glucosa) se ven afectados en gran medida por lo que come y bebe. [place]er alimentos saludables en las cantidades adecuadas, aproximadamente a la misma hora todos los das, lo ayudar a:  Controlar la glucemia.  Disminuir el riesgo de sufrir una enfermedad cardaca.  Mejorar la presin arterial.  Alcanzar o mantener un peso saludable. Todas las personas que sufren de diabetes son diferentes y cada una tiene necesidades diferentes en cuanto a un plan de alimentacin. El mdico puede recomendarle que trabaje con un especialista en dietas y nutricin (nutricionista) para elaborar el mejor plan para usted. Su plan de alimentacin puede variar segn factores como:  Las caloras que  necesita.  Los medicamentos que toma.  Su peso.  Sus niveles de glucemia, presin arterial y colesterol.  Su nivel de actividad.  Otras afecciones que tenga, como enfermedades cardacas o renales. Cmo me afectan los carbohidratos? Los carbohidratos, o hidratos de carbono, afectan su nivel de glucemia ms que cualquier otro tipo de alimento. La ingesta de carbohidratos naturalmente aumenta la cantidad de glucosa en la sangre. El recuento de carbohidratos es un mtodo destinado a llevar un registro de la cantidad de carbohidratos que se consumen. El recuento de carbohidratos es importante para mantener la glucemia a un nivel saludable, especialmente si utiliza insulina o toma determinados medicamentos por va oral para la diabetes. Es importante conocer la cantidad de carbohidratos que se pueden ingerir en cada comida sin correr ningn riesgo. Esto es diferente en cada persona. Su nutricionista puede ayudarlo a calcular la cantidad de carbohidratos que debe ingerir en cada comida y en cada refrigerio. Entre los alimentos que contienen carbohidratos, se incluyen:  Pan, cereal, arroz, pastas y galletas.  Papas y maz.  Guisantes, frijoles y lentejas.  Leche y yogur.  Frutas y jugo.  Postres, como pasteles, galletas, helado y caramelos. Cmo me afecta el alcohol? El alcohol puede provocar disminuciones sbitas de la glucemia (hipoglucemia), especialmente si utiliza insulina o toma determinados medicamentos por va oral para la diabetes. La hipoglucemia es una afeccin potencialmente mortal. Los sntomas de la hipoglucemia (somnolencia, mareos y confusin) son similares a los sntomas de haber consumido demasiado alcohol. Si el mdico afirma que el alcohol es seguro para usted, siga estas pautas:    Limite el consumo de alcohol a no ms de 1medida por da si es mujer y no est embarazada, y a 2medidas si es hombre. Una medida equivale a 12oz (355ml) de cerveza, 5oz (148ml) de vino o  1oz (44ml) de bebidas alcohlicas de alta graduacin.  No beba con el estmago vaco.  Mantngase hidratado bebiendo agua, refrescos dietticos o t helado sin azcar.  Tenga en cuenta que los refrescos comunes, los jugos y otras bebida para mezclar pueden contener mucha azcar y se deben contar como carbohidratos. Cules son algunos consejos para seguir este plan?  Leer las etiquetas de los alimentos  Comience por leer el tamao de la porcin en la "Informacin nutricional" en las etiquetas de los alimentos envasados y las bebidas. La cantidad de caloras, carbohidratos, grasas y otros nutrientes mencionados en la etiqueta se basan en una porcin del alimento. Muchos alimentos contienen ms de una porcin por envase.  Verifique la cantidad total de gramos (g) de carbohidratos totales en una porcin. Puede calcular la cantidad de porciones de carbohidratos al dividir el total de carbohidratos por 15. Por ejemplo, si un alimento tiene un total de 30g de carbohidratos, equivale a 2 porciones de carbohidratos.  Verifique la cantidad de gramos (g) de grasas saturadas y grasas trans en una porcin. Escoja alimentos que no contengan grasa o que tengan un bajo contenido.  Verifique la cantidad de miligramos (mg) de sal (sodio) en una porcin. La mayora de las personas deben limitar la ingesta de sodio total a menos de 2300mg por da.  Siempre consulte la informacin nutricional de los alimentos etiquetados como "con bajo contenido de grasa" o "sin grasa". Estos alimentos pueden tener un mayor contenido de azcar agregada o carbohidratos refinados, y deben evitarse.  Hable con su nutricionista para identificar sus objetivos diarios en cuanto a los nutrientes mencionados en la etiqueta. Al ir de compras  Evite comprar alimentos procesados, enlatados o precocinados. Estos alimentos tienden a tener una mayor cantidad de grasa, sodio y azcar agregada.  Compre en la zona exterior de la tienda de  comestibles. Esta zona incluye frutas y verduras frescas, granos a granel, carnes frescas y productos lcteos frescos. Al cocinar  Utilice mtodos de coccin a baja temperatura, como hornear, en lugar de mtodos de coccin a alta temperatura, como frer en abundante aceite.  Cocine con aceites saludables, como el aceite de oliva, canola o girasol.  Evite cocinar con manteca, crema o carnes con alto contenido de grasa. Planificacin de las comidas  Coma las comidas y los refrigerios regularmente, preferentemente a la misma hora todos los das. Evite pasar largos perodos de tiempo sin comer.  Consuma alimentos ricos en fibra, como frutas frescas, verduras, frijoles y cereales integrales. Consulte a su nutricionista sobre cuntas porciones de carbohidratos puede consumir en cada comida.  Consuma entre 4 y 6 onzas (oz) de protenas magras por da, como carnes magras, pollo, pescado, huevos o tofu. Una onza de protena magra equivale a: ? 1 onza de carne, pollo o pescado. ? 1huevo. ?  taza de tofu.  Coma algunos alimentos por da que contengan grasas saludables, como aguacates, frutos secos, semillas y pescado. Estilo de vida  Controle su nivel de glucemia con regularidad.  Haga actividad fsica habitualmente como se lo haya indicado el mdico. Esto puede incluir lo siguiente: ? 150minutos semanales de ejercicio de intensidad moderada o alta. Esto podra incluir caminatas dinmicas, ciclismo o gimnasia acutica. ? Realizar ejercicios de elongacin y de fortalecimiento, como yoga o levantamiento   de pesas, por lo menos 2veces por semana.  Tome los medicamentos como se lo haya indicado el mdico.  No consuma ningn producto que contenga nicotina o tabaco, como cigarrillos y cigarrillos electrnicos. Si necesita ayuda para dejar de fumar, consulte al mdico.  Trabaje con un asesor o instructor en diabetes para identificar estrategias para controlar el estrs y cualquier desafo emocional  y social. Preguntas para hacerle al mdico  Es necesario que consulte a un instructor en el cuidado de la diabetes?  Es necesario que me rena con un nutricionista?  A qu nmero puedo llamar si tengo preguntas?  Cules son los mejores momentos para controlar la glucemia? Dnde encontrar ms informacin:  Asociacin Estadounidense de la Diabetes (American Diabetes Association): diabetes.org  Academia de Nutricin y Diettica (Academy of Nutrition and Dietetics): www.eatright.org  Instituto Nacional de la Diabetes y las Enfermedades Digestivas y Renales (National Institute of Diabetes and Digestive and Kidney Diseases, NIH): www.niddk.nih.gov Resumen  Un plan de alimentacin saludable lo ayudar a controlar la glucemia y mantener un estilo de vida saludable.  Trabajar con un especialista en dietas y nutricin (nutricionista) puede ayudarlo a elaborar el mejor plan de alimentacin para usted.  Tenga en cuenta que los carbohidratos (hidratos de carbono) y el alcohol tienen efectos inmediatos en sus niveles de glucemia. Es importante contar los carbohidratos que ingiere y consumir alcohol con prudencia. Esta informacin no tiene como fin reemplazar el consejo del mdico. Asegrese de hacerle al mdico cualquier pregunta que tenga. Document Revised: 03/22/2017 Document Reviewed: 11/01/2016 Elsevier Patient Education  2020 Elsevier Inc.  

## 2019-09-20 NOTE — Assessment & Plan Note (Signed)
Well-controlled hypertension without medication. Well-controlled diabetes without medication with hemoglobin A1c of 5.8. Well-controlled with diet and exercise. Continue present treatment.  No medications. Follow-up in 6 months.

## 2019-09-20 NOTE — Progress Notes (Signed)
BP Readings from Last 3 Encounters:  09/20/19 132/86  04/19/19 (!) 145/87  01/18/19 130/84

## 2019-09-20 NOTE — Progress Notes (Signed)
Dominic [name] 44 y.o.   Chief Complaint  Patient presents with  . Hypertension    Pt has not been able to take BP at home, pt denies physical symptoms  . Diabetes    pt has been checking BS at home recent readings include 114, and 116, pt has been working on his diet to improve his dieabetes as well as cholesterol    HISTORY OF PRESENT ILLNESS: This is a 44 y.o. male here for follow-up of hypertension, prediabetes, and dyslipidemia. Patient has not been taking any medications.  Diet modification and increased exercise has helped him a lot. Prefers not to take any medications.  Feels much better.  Has no complaints or medical concerns today. BP Readings from Last 3 Encounters:  09/20/19 132/86  04/19/19 (!) 145/87  01/18/19 130/84   Lab Results  Component Value Date   HGBA1C 6.1 (H) 04/19/2019   Lab Results  Component Value Date   CHOL 150 04/19/2019   HDL 37 (L) 04/19/2019   LDLCALC 89 04/19/2019   TRIG 132 04/19/2019   CHOLHDL 4.1 04/19/2019   No Covid related issues.  HPI   Prior to Admission medications   Medication Sig Start Date End Date Taking? Authorizing Provider  Accu-Chek FastClix Lancets MISC USE TO TEST BLOOD SUGAR UP TO 4 TIMES A DAY 07/11/18  Yes [provider]  ACCU-CHEK GUIDE test strip USE TO TEST BLOOD SUGAR UP TO 4 TIMES A DAY 07/11/18  Yes [provider]  blood glucose meter kit and supplies KIT Dispense based on patient and insurance preference. Use up to four times daily as directed. (FOR ICD-9 250.00, 250.01). 07/11/18  Yes Salif Tay, Ines Bloomer, MD  clotrimazole-betamethasone (LOTRISONE) cream Apply 1 application topically 2 (two) times daily. 08/21/19  Yes Zurie Platas, Ines Bloomer, MD  fluticasone [facility]) 50 MCG/ACT nasal spray SHAKE LIQUID AND USE 2 SPRAYS IN EACH NOSTRIL DAILY 08/21/19  Yes Rayen Dafoe, Ines Bloomer, MD  lisinopril (ZESTRIL) 10 MG tablet Take 1 tablet (10 mg total) by mouth daily. 01/18/19  Yes Alesi Zachery, Ines Bloomer, MD  loratadine (CLARITIN) 10 MG tablet TAKE 1 TABLET(10 MG) BY MOUTH DAILY 08/21/19  Yes Brisia Schuermann, Ines Bloomer, MD  metFORMIN (GLUCOPHAGE) 500 MG tablet TAKE 1 TABLET(500 MG) BY MOUTH TWICE DAILY WITH A MEAL 06/20/19  Yes Kailo Kosik, Ines Bloomer, MD  rosuvastatin (CRESTOR) 10 MG tablet Take 1 tablet (10 mg total) by mouth daily. 01/18/19  Yes Silvino Selman, Ines Bloomer, MD  omeprazole (PRILOSEC) 20 MG capsule Take 1 capsule (20 mg total) by mouth daily. 01/18/19 04/18/19  Horald Pollen, MD    No Known Allergies  Patient Active Problem List   Diagnosis Date Noted  . Dyslipidemia associated with type 2 diabetes mellitus ([place]) 04/21/2018  . OSA on CPAP 01/15/2016  . Hypertension associated with diabetes ([place]) 09/22/2015  . Obesity 05/31/2014    Past Medical History:  Diagnosis Date  . Allergy   . GERD (gastroesophageal reflux disease)   . Hernia   . Hyperlipidemia     Past Surgical History:  Procedure Laterality Date  . GROIN DISSECTION  11/19/2011   Procedure: Virl Son EXPLORATION;  Surgeon: Harl Bowie, MD;  Location: [facility];  Service: General;  Laterality: Right;  Right Groin Exploration  . INGUINAL HERNIA REPAIR  2012   right  . INGUINAL HERNIA REPAIR  11/19/2011   Procedure: HERNIA REPAIR INGUINAL ADULT;  Surgeon: Harl Bowie, MD;  Location: [facility];  Service: General;  Laterality: Right;  excision of right groin mass    Social History   Socioeconomic History  . Marital status: Married    Spouse name: Not on file  . Number of children: Not on file  . Years of education: Not on file  . Highest education level: Not on file  Occupational History    Comment: Demolition work  Tobacco Use  . Smoking status: Former Smoker    Packs/day: 0.25    Years: 3.00    Pack years: 0.75    Types: Cigarettes  . Smokeless tobacco: Never Used  Substance and Sexual Activity  . 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 Drug use: No  . Sexual activity: Not  on file  Other Topics Concern  . Not on file  Social History Narrative   Marital status: ; from [place]; [place] 25 years ago.      Children:  4 children; no grandchildren      Tobacco: none   Social Determinants of Health   Financial Resource Strain:   . Difficulty of Paying Living Expenses: Not on file  Food Insecurity:   . Worried About Charity fundraiser in the Last Year: Not on file  . Ran Out of Food in the Last Year: Not on file  Transportation Needs:   . Lack of Transportation (Medical): Not on file  . Lack of Transportation (Non-Medical): Not on file  Physical Activity:   . Days of Exercise per Week: Not on file  . Minutes of Exercise per Session: Not on file  Stress:   . Feeling of Stress : Not on file  Social Connections:   . Frequency of Communication with Friends and Family: Not on file  . Frequency of Social Gatherings with Friends and Family: Not on file  . Attends Religious Services: Not on file  . Active Member of Clubs or Organizations: Not on file  . Attends Archivist Meetings: Not on file  . Marital Status: Not on file  Intimate Partner Violence:   . Fear of Current or Ex-Partner: Not on file  . Emotionally Abused: Not on file  . Physically Abused: Not on file  . Sexually Abused: Not on file    Family History  Problem Relation Age of Onset  . Hypertension Mother   . Diabetes Mother   . Diabetes Father      Review of Systems  Constitutional: Negative.  Negative for chills and fever.  HENT: Negative.  Negative for congestion and sore throat.   Respiratory: Negative.  Negative for cough and shortness of breath.   Cardiovascular: Negative.  Negative for chest pain and palpitations.  Gastrointestinal: Negative.  Negative for abdominal pain, blood in stool, diarrhea, melena, nausea and vomiting.  Genitourinary: Negative.  Negative for dysuria.  Musculoskeletal: Negative.  Negative for myalgias.  Skin: Negative.  Negative for rash.   Neurological: Negative.  Negative for dizziness and headaches.  Endo/Heme/Allergies: Negative.   All other systems reviewed and are negative.  Today's Vitals   09/20/19 0834  BP: 132/86  Pulse: 68  Resp: 16  Temp: 98.5 F (36.9 C)  TempSrc: Temporal  SpO2: 93%  Weight: 247 lb (112 kg)  Height: '5\' 9"'  (1.753 m)   Body mass index is 36.48 kg/m.   Physical Exam Vitals reviewed.  Constitutional:      Appearance: Normal appearance.  HENT:     Head: Normocephalic.  Eyes:     Extraocular Movements: Extraocular movements intact.  Pupils: Pupils are equal, round, and reactive to light.  Cardiovascular:     Rate and Rhythm: Normal rate and regular rhythm.     Heart sounds: Normal heart sounds.  Pulmonary:     Effort: Pulmonary effort is normal.     Breath sounds: Normal breath sounds.  Abdominal:     General: There is no distension.     Palpations: Abdomen is soft.     Tenderness: There is no abdominal tenderness.  Musculoskeletal:        General: Normal range of motion.     Cervical back: Normal range of motion and neck supple.  Skin:    General: Skin is warm and dry.     Capillary Refill: Capillary refill takes less than 2 seconds.  Neurological:     General: No focal deficit present.     Mental Status: He is alert and oriented to person, place, and time.  Psychiatric:        Mood and Affect: Mood normal.        Behavior: Behavior normal.    Results for orders placed or performed in visit on 09/20/19 (from the past 24 hour(s))  POCT glucose (manual entry)     Status: Abnormal   Collection Time: 09/20/19  9:07 AM  Result Value Ref Range   POC Glucose 106 (A) 70 - 99 mg/dl  POCT glycosylated hemoglobin (Hb A1C)     Status: Abnormal   Collection Time: 09/20/19  9:07 AM  Result Value Ref Range   Hemoglobin A1C 5.8 (A) 4.0 - 5.6 %   HbA1c POC (<> result, manual entry)     HbA1c, POC (prediabetic range)     HbA1c, POC (controlled diabetic range)     A total of  30 minutes was spent with the patient, greater than 50% of which was in counseling/coordination of care regarding chronic medical problems, review of most recent office visit notes, review of most recent blood work, review of today's A1c, review of all medications, cardiovascular risk associated with this condition, importance of diet and exercise, prognosis and need for follow-up.  ASSESSMENT & PLAN: Hypertension associated with diabetes ([place]) Well-controlled hypertension without medication. Well-controlled diabetes without medication with hemoglobin A1c of 5.8. Well-controlled with diet and exercise. Continue present treatment.  No medications. Follow-up in 6 months.  Dominic [name] was seen today for hypertension and diabetes.  Diagnoses and all orders for this visit:  Type 2 diabetes mellitus with hyperglycemia, without long-term current use of insulin (HCC) -     POCT glucose (manual entry) -     POCT glycosylated hemoglobin (Hb A1C)  Hypertension associated with diabetes ([place]) -     CBC with Differential/Platelet -     Comprehensive metabolic panel  Dyslipidemia associated with type 2 diabetes mellitus (HCC) -     Lipid panel  OSA on CPAP  Body mass index (BMI) of 36.0-36.9 in adult    Patient Instructions       If you have lab work done today you will be contacted with your lab results within the next 2 weeks.  If you have not heard from [name] then please contact us. The fastest way to get your results is to register for My Chart.   IF you received an x-ray today, you will receive an invoice from [facility]. Please contact [facility] at [phone] with questions or concerns regarding your invoice.   IF you received labwork today, you will receive an invoice from [facility]. Please contact  [facility] at [phone] with questions or concerns regarding your invoice.   Our billing staff will not be able to assist you with questions regarding bills from these  companies.  You will be contacted with the lab results as soon as they are available. The fastest way to get your results is to activate your My Chart account. Instructions are located on the last page of this paperwork. If you have not heard from [name] regarding the results in 2 weeks, please contact this office.     Diabetes mellitus y nutricin, en adultos Diabetes Mellitus and Nutrition, Adult Si sufre de diabetes (diabetes mellitus), es muy importante tener hbitos alimenticios saludables debido a que sus niveles de Designer, television/film set sangre (glucosa) se ven afectados en gran medida por lo que come y bebe. [place] alimentos saludables en las cantidades [place], aproximadamente a la [employer], [place] ayudar a:  Aeronautical engineer glucemia.  Disminuir el riesgo de sufrir una enfermedad cardaca.  Mejorar la presin arterial.  Science writer o mantener un peso saludable. Todas las personas que sufren de diabetes son diferentes y cada una tiene necesidades diferentes en cuanto a un plan de alimentacin. El mdico puede recomendarle que trabaje con un especialista en dietas y nutricin (nutricionista) para Financial trader plan para usted. Su plan de alimentacin puede variar segn factores como:  Las caloras que necesita.  Los medicamentos que toma.  Su peso.  Sus niveles de glucemia, presin arterial y colesterol.  Su nivel de [place].  Otras afecciones que tenga, como enfermedades cardacas o renales. Cmo me afectan los carbohidratos? Los carbohidratos, o hidratos de carbono, afectan su nivel de glucemia ms que cualquier otro tipo de alimento. La ingesta de carbohidratos naturalmente aumenta la cantidad de [employer]. El recuento de carbohidratos es un mtodo destinado a Catering manager un registro de la cantidad de carbohidratos que se consumen. El recuento de carbohidratos es importante para Theatre manager la glucemia a un nivel saludable, especialmente si utiliza insulina o toma  determinados medicamentos por va oral para la diabetes. Es importante conocer la cantidad de carbohidratos que se pueden ingerir en cada comida sin correr Engineer, manufacturing. Esto es Psychologist, forensic. Su nutricionista puede ayudarlo a calcular la cantidad de carbohidratos que debe ingerir en cada comida y en cada refrigerio. Entre los alimentos que contienen carbohidratos, se incluyen:  Pan, cereal, arroz, pastas y galletas.  Papas y maz.  Guisantes, frijoles y lentejas.  Leche y Estate agent.  Lambert Mody y [place].  Postres, como pasteles, galletas, helado y caramelos. Cmo me afecta el alcohol? El alcohol puede provocar disminuciones sbitas de la glucemia (hipoglucemia), especialmente si utiliza insulina o toma determinados medicamentos por va oral para la diabetes. La hipoglucemia es una afeccin potencialmente mortal. Los sntomas de la hipoglucemia (somnolencia, mareos y confusin) son similares a los sntomas de haber consumido demasiado alcohol. Si el mdico afirma que el alcohol es seguro para usted, [place] estas pautas:  Limite el consumo de alcohol a no ms de 30mdida por da si es mujer y no est e[place] y a 259midas si es hombre. Una medida equivale a 12oz (35577mde cerveza, 5oz (148m5me vino o 1oz (44ml17m bebidas alcohlicas de alta graduacin.  No beba con el estmago vaco.  Mantngase hidratado bebiendo agua, refrescos dietticos o t helado sin azcar.  Tenga en cuenta que los refrescos comunes, los jugos y otras bebida para mezclOptician, dispensingen contener mucha azcar y se deben contar como carbohidratos. Cules son  algunos consejos para seguir este plan?  Leer las etiquetas de los alimentos  Comience por leer el tamao de la porcin en la "Informacin nutricional" en las etiquetas de los alimentos envasados y las bebidas. La cantidad de caloras, carbohidratos, grasas y otros nutrientes mencionados en la etiqueta se basan en una porcin del alimento. Muchos  alimentos contienen ms de una porcin por envase.  Verifique la cantidad total de gramos (g) de carbohidratos totales en una porcin. Puede calcular la cantidad de porciones de carbohidratos al dividir el total de carbohidratos por 15. Por ejemplo, si un alimento tiene un total de 30g de carbohidratos, equivale a 2 porciones de carbohidratos.  Verifique la cantidad de gramos (g) de grasas saturadas y grasas trans en una porcin. Escoja alimentos que no contengan grasa o que tengan un bajo contenido.  Verifique la cantidad de miligramos (mg) de sal (sodio) en una porcin. [place] [employer] de las personas deben limitar la ingesta de sodio total a menos de 2360m por dTraining and development officer  Siempre consulte la informacin nutricional de los alimentos etiquetados como "con bajo contenido de grasa" o "sin grasa". Estos alimentos pueden tener un mayor contenido de aLocation manageragregada o carbohidratos refinados, y deben evitarse.  Hable con su nutricionista para identificar sus objetivos diarios en cuanto a los nutrientes mencionados en la etiqueta. Al ir de compras  Evite comprar alimentos procesados, enlatados o precocinados. Estos alimentos tienden a tSpecial educational needs teachermayor cantidad de gRest [place] sodio y azcar agregada.  Compre en la zona exterior de la tienda de comestibles. Esta zona incluye frutas y verduras frescas, granos a granel, carnes frescas y productos lcteos frescos. Al cocinar  Utilice mtodos de coccin a baja temperatura, como hornear, en lugar de mtodos de coccin a alta temperatura, como frer en abundante aceite.  Cocine con aceites saludables, como el aceite de oWishram canola o gBethel  Evite cocinar con manteca, crema o carnes con alto contenido de grasa. Planificacin de las comidas  Coma las comidas y los refrigerios regularmente, preferentemente a la misma hora todos lEast[place] Evite pasar largos perodos de tiempo sin comer.  Consuma alimentos ricos en fibra, como frutas frescas, verduras, frijoles y  cereales integrales. Consulte a su nutricionista sobre cuntas porciones de carbohidratos puede consumir en cada comida.  Consuma entre 4 y 6 onzas (oz) de protenas magras por da, como carnes mStill Pond pollo, pescado, huevos o tofu. Una onza de protena magra equivale a: ? 1 onza de carne, pollo o pescado. ? 1huevo. ?  taza de tofu.  Coma algunos alimentos por da que contengan grasas saludables, como aguacates, frutos secos, semillas y pescado. Estilo de vida  Controle su nivel de glucemia con regularidad.  Haga actividad fsica habitualmente como se lo haya indicado el mdico. Esto puede incluir lo siguiente: ? 1590mutos semanales de ejercicio de intensidad moderada o alta. Esto podra incluir caminatas dinmicas, ciclismo o gimnasia acutica. ? Realizar ejercicios de elongacin y de fortalecimiento, como yoga o levantamiento de pesas, por lo menos 2veces por semana.  Tome los meTenneco Ince lo haya indicado el mdico.  No consuma ningn producto que contenga nicotina o tabaco, como cigarrillos y ciPsychologist, sport and exerciseSi necesita ayuda para dejar de fumar, consulte al mdHess Corporationon un asesor o instructor en diabetes para identificar estrategias para controlar el estrs y cualquier desafo emocional y social. Preguntas para hacerle al mdico  Es necesario que consulte a unRadio broadcast assistantn el cuidado de la diabetes?  Es necesario que me rena con  un nutricionista?  A qu nmero puedo llamar si tengo preguntas?  Cules son los mejores momentos para controlar la glucemia? Dnde encontrar ms informacin:  Asociacin Estadounidense de la Diabetes (American Diabetes Association): diabetes.org  Academia de Nutricin y Information systems manager (Academy of Nutrition and Dietetics): www.eatright.[place] Diabetes y las Enfermedades Digestivas y [facility] of Diabetes and Digestive and Kidney Diseases, NIH): DesMoinesFuneral.dk Resumen  Un  plan de alimentacin saludable lo ayudar a Aeronautical engineer glucemia y Theatre manager un estilo de vida saludable.  Trabajar con un especialista en dietas y nutricin (nutricionista) puede ayudarlo a Insurance claims handler de alimentacin para usted.  Tenga en cuenta que los carbohidratos (hidratos de carbono) y el alcohol tienen efectos inmediatos en sus niveles de glucemia. Es importante contar los carbohidratos que ingiere y consumir alcohol con prudencia. Esta informacin no tiene Marine scientist el consejo del mdico. Asegrese de hacerle al mdico cualquier pregunta que tenga. Document Revised: 03/22/2017 Document Reviewed: 11/01/2016 Elsevier Patient Education  2020 Elsevier Inc.      Agustina Caroli, MD [facility]

## 2019-09-21 DIAGNOSIS — H524 Presbyopia: Secondary | ICD-10-CM | POA: Diagnosis not present

## 2019-09-21 LAB — CBC WITH DIFFERENTIAL/PLATELET
Basophils Absolute: 0.1 10*3/uL (ref 0.0–0.2)
Basos: 1 %
EOS (ABSOLUTE): 0.3 10*3/uL (ref 0.0–0.4)
Eos: 4 %
Hematocrit: 52.7 % — ABNORMAL HIGH (ref 37.5–51.0)
Hemoglobin: 17.6 g/dL (ref 13.0–17.7)
Immature Grans (Abs): 0 10*3/uL (ref 0.0–0.1)
Immature Granulocytes: 0 %
Lymphocytes Absolute: 2.3 10*3/uL (ref 0.7–3.1)
Lymphs: 30 %
MCH: 31.6 pg (ref 26.6–33.0)
MCHC: 33.4 g/dL (ref 31.5–35.7)
MCV: 95 fL (ref 79–97)
Monocytes Absolute: 0.5 10*3/uL (ref 0.1–0.9)
Monocytes: 7 %
Neutrophils Absolute: 4.5 10*3/uL (ref 1.4–7.0)
Neutrophils: 58 %
Platelets: 210 10*3/uL (ref 150–450)
RBC: 5.57 x10E6/uL (ref 4.14–5.80)
RDW: 12.9 % (ref 11.6–15.4)
WBC: 7.7 10*3/uL (ref 3.4–10.8)

## 2019-09-21 LAB — COMPREHENSIVE METABOLIC PANEL
ALT: 60 IU/L — ABNORMAL HIGH (ref 0–44)
AST: 39 IU/L (ref 0–40)
Albumin/Globulin Ratio: 1.4 (ref 1.2–2.2)
Albumin: 4.4 g/dL (ref 4.0–5.0)
Alkaline Phosphatase: 73 IU/L (ref 39–117)
BUN/Creatinine Ratio: 16 (ref 9–20)
BUN: 13 mg/dL (ref 6–24)
Bilirubin Total: 1.5 mg/dL — ABNORMAL HIGH (ref 0.0–1.2)
CO2: 19 mmol/L — ABNORMAL LOW (ref 20–29)
Calcium: 9.2 mg/dL (ref 8.7–10.2)
Chloride: 103 mmol/L (ref 96–106)
Creatinine, Ser: 0.79 mg/dL (ref 0.76–1.27)
GFR calc Af Amer: 125 mL/min/{1.73_m2} (ref >59)
GFR calc non Af Amer: 108 mL/min/{1.73_m2} (ref >59)
Globulin, Total: 3.1 g/dL (ref 1.5–4.5)
Glucose: 117 mg/dL — ABNORMAL HIGH (ref 65–99)
Potassium: 4.1 mmol/L (ref 3.5–5.2)
Sodium: 138 mmol/L (ref 134–144)
Total Protein: 7.5 g/dL (ref 6.0–8.5)

## 2019-09-21 LAB — LIPID PANEL
Chol/HDL Ratio: 4.1 ratio (ref 0.0–5.0)
Cholesterol, Total: 168 mg/dL (ref 100–199)
HDL: 41 mg/dL (ref >39)
LDL Chol Calc (NIH): 105 mg/dL — ABNORMAL HIGH (ref 0–99)
Triglycerides: 125 mg/dL (ref 0–149)
VLDL Cholesterol Cal: 22 mg/dL (ref 5–40)

## 2019-09-22 ENCOUNTER — Encounter: Payer: Self-pay | Admitting: Emergency Medicine

## 2019-10-19 ENCOUNTER — Ambulatory Visit: Payer: BC Managed Care – PPO | Admitting: Emergency Medicine

## 2019-10-23 ENCOUNTER — Ambulatory Visit: Payer: BC Managed Care – PPO | Admitting: Emergency Medicine

## 2019-11-01 ENCOUNTER — Ambulatory Visit: Payer: BC Managed Care – PPO | Admitting: Podiatry

## 2019-11-13 ENCOUNTER — Other Ambulatory Visit: Payer: Self-pay | Admitting: Emergency Medicine

## 2019-11-13 DIAGNOSIS — J301 Allergic rhinitis due to pollen: Secondary | ICD-10-CM

## 2019-11-13 NOTE — Telephone Encounter (Signed)
Requested Prescriptions  Pending Prescriptions Disp Refills  . fluticasone (FLONASE) 50 MCG/ACT nasal spray [Pharmacy Med Name: FLUTICASONE 50MCG NASAL SP (120) RX] 48 g 1    Sig: SHAKE LIQUID AND USE 2 SPRAYS IN EACH NOSTRIL DAILY     Ear, Nose, and Throat: Nasal Preparations - Corticosteroids Passed - 11/13/2019  4:01 AM      Passed - Valid encounter within last 12 months    Recent Outpatient Visits          1 month ago Type 2 diabetes mellitus with hyperglycemia, without long-term current use of insulin [facility])   Primary Care at [facility], [place], [place]   6 months ago Hypertension associated with diabetes [facility])   Primary Care at [facility], [place], [place]   9 months ago Type 2 diabetes mellitus with hyperglycemia, without long-term current use of insulin [facility])   Primary Care at [facility], [place], [place]   1 year ago Type 2 diabetes mellitus with hyperglycemia, without long-term current use of insulin [facility])   Primary Care at [place], [place]   1 year ago Chronic seasonal allergic rhinitis due to pollen   Primary Care at [facility]: [place] Rhonda W, Ines Bloomer, MD      Future Appointments            In 4 months Sagardia, Ines Bloomer, MD Primary Care at [place], [facility]

## 2020-01-24 ENCOUNTER — Other Ambulatory Visit: Payer: Self-pay | Admitting: Emergency Medicine

## 2020-01-24 DIAGNOSIS — K219 Gastro-esophageal reflux disease without esophagitis: Secondary | ICD-10-CM

## 2020-03-20 ENCOUNTER — Encounter: Payer: Self-pay | Admitting: Emergency Medicine

## 2020-03-20 ENCOUNTER — Ambulatory Visit (INDEPENDENT_AMBULATORY_CARE_PROVIDER_SITE_OTHER): Payer: BC Managed Care – PPO | Admitting: Emergency Medicine

## 2020-03-20 ENCOUNTER — Other Ambulatory Visit: Payer: Self-pay

## 2020-03-20 VITALS — BP 140/89 | HR 65 | Temp 98.6°F | Resp 16 | Ht 71.0 in | Wt 262.0 lb

## 2020-03-20 DIAGNOSIS — E1159 Type 2 diabetes mellitus with other circulatory complications: Secondary | ICD-10-CM

## 2020-03-20 DIAGNOSIS — I10 Essential (primary) hypertension: Secondary | ICD-10-CM | POA: Diagnosis not present

## 2020-03-20 DIAGNOSIS — E1165 Type 2 diabetes mellitus with hyperglycemia: Secondary | ICD-10-CM

## 2020-03-20 DIAGNOSIS — I152 Hypertension secondary to endocrine disorders: Secondary | ICD-10-CM

## 2020-03-20 DIAGNOSIS — Z6836 Body mass index (BMI) 36.0-36.9, adult: Secondary | ICD-10-CM | POA: Diagnosis not present

## 2020-03-20 LAB — GLUCOSE, POCT (MANUAL RESULT ENTRY): POC Glucose: 118 mg/dl — AB (ref 70–99)

## 2020-03-20 LAB — POCT GLYCOSYLATED HEMOGLOBIN (HGB A1C): Hemoglobin A1C: 6.8 % — AB (ref 4.0–5.6)

## 2020-03-20 MED ORDER — ROSUVASTATIN CALCIUM 10 MG PO TABS: 10.0000 mg | ORAL_TABLET | Freq: Every day | ORAL | 3 refills | Status: DC

## 2020-03-20 MED ORDER — LISINOPRIL 20 MG PO TABS: 20.0000 mg | ORAL_TABLET | Freq: Every day | ORAL | 3 refills | Status: DC

## 2020-03-20 MED ORDER — METFORMIN HCL 500 MG PO TABS: 500.0000 mg | ORAL_TABLET | Freq: Two times a day (BID) | ORAL | 3 refills | Status: DC

## 2020-03-20 NOTE — Progress Notes (Signed)
Dominic [name] 44 y.o.   Chief Complaint  Patient presents with  . Diabetes    follow up 6 months, per patient he does not need medication refills    HISTORY OF PRESENT ILLNESS: This is a 44 y.o. male with history of diabetes, hypertension, and dyslipidemia presently on no medications.  Here for follow-up.  Doing well.  Has history of chronic seasonal allergies.  No other complaints or medical concerns today.  Fully vaccinated against Covid.  HPI   Prior to Admission medications   Medication Sig Start Date End Date Taking? Authorizing Provider  clotrimazole-betamethasone (LOTRISONE) cream Apply 1 application topically 2 (two) times daily. 08/21/19  Yes Shykeem Resurreccion, Ines Bloomer, MD  omeprazole (PRILOSEC) 20 MG capsule TAKE 1 CAPSULE(20 MG) BY MOUTH DAILY 01/24/20  Yes [place], Ines Bloomer, MD  Accu-Chek FastClix Lancets MISC USE TO TEST BLOOD SUGAR UP TO 4 TIMES A DAY 07/11/18   [provider]  ACCU-CHEK GUIDE test strip USE TO TEST BLOOD SUGAR UP TO 4 TIMES A DAY 07/11/18   [provider]  blood glucose meter kit and supplies KIT Dispense based on patient and insurance preference. Use up to four times daily as directed. (FOR ICD-9 250.00, 250.01). 07/11/18   Horald Pollen, MD  fluticasone [facility]) 50 MCG/ACT nasal spray SHAKE LIQUID AND USE 2 SPRAYS IN [facility] NOSTRIL DAILY Patient not taking: Reported on 03/20/2020 11/13/19   Horald Pollen, MD  lisinopril (ZESTRIL) 10 MG tablet Take 1 tablet (10 mg total) by mouth daily. Patient not taking: Reported on 03/20/2020 01/18/19   Horald Pollen, MD  loratadine (CLARITIN) 10 MG tablet TAKE 1 TABLET(10 MG) BY MOUTH DAILY Patient not taking: Reported on 03/20/2020 08/21/19   Horald Pollen, MD  metFORMIN (GLUCOPHAGE) 500 MG tablet TAKE 1 TABLET(500 MG) BY MOUTH TWICE DAILY WITH A MEAL Patient not taking: Reported on 03/20/2020 06/20/19   Horald Pollen, MD  rosuvastatin (CRESTOR) 10 MG tablet Take 1  tablet (10 mg total) by mouth daily. Patient not taking: Reported on 03/20/2020 01/18/19   Horald Pollen, MD    No Known Allergies  Patient Active Problem List   Diagnosis Date Noted  . Dyslipidemia associated with type 2 diabetes mellitus ([place]) 04/21/2018  . OSA on CPAP 01/15/2016  . Hypertension associated with diabetes ([place]) 09/22/2015  . Obesity 05/31/2014    Past Medical History:  Diagnosis Date  . Allergy   . GERD (gastroesophageal reflux disease)   . Hernia   . Hyperlipidemia     Past Surgical History:  Procedure Laterality Date  . GROIN DISSECTION  11/19/2011   Procedure: Virl Son EXPLORATION;  Surgeon: Harl Bowie, MD;  Location: [facility];  Service: General;  Laterality: Right;  Right Groin Exploration  . INGUINAL HERNIA REPAIR  2012   right  . INGUINAL HERNIA REPAIR  11/19/2011   Procedure: HERNIA REPAIR INGUINAL ADULT;  Surgeon: Harl Bowie, MD;  Location: [facility];  Service: General;  Laterality: Right;  excision of right groin mass    Social History   Socioeconomic History  . Marital status: Married    Spouse name: Not on file  . Number of children: Not on file  . Years of education: Not on file  . Highest education level: Not on file  Occupational History    Comment: Demolition work  Tobacco Use  . Smoking status: Former Smoker    Packs/day: 0.25    Years: 3.00    Pack years:  0.75    Types: Cigarettes  . Smokeless tobacco: Never Used  Substance and Sexual Activity  . 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 Drug use: No  . Sexual activity: Not on file  Other Topics Concern  . Not on file  Social History Narrative   Marital status: ; from [place]; [place] 25 years ago.      Children:  4 children; no grandchildren      Tobacco: none   Social Determinants of Health   Financial Resource Strain:   . Difficulty of Paying Living Expenses: Not on file  Food Insecurity:   . Worried About Charity fundraiser in the Last Year:  Not on file  . Ran Out of Food in the Last Year: Not on file  Transportation Needs:   . Lack of Transportation (Medical): Not on file  . Lack of Transportation (Non-Medical): Not on file  Physical Activity:   . Days of Exercise per Week: Not on file  . Minutes of Exercise per Session: Not on file  Stress:   . Feeling of Stress : Not on file  Social Connections:   . Frequency of Communication with Friends and Family: Not on file  . Frequency of Social Gatherings with Friends and Family: Not on file  . Attends Religious Services: Not on file  . Active Member of Clubs or Organizations: Not on file  . Attends Archivist Meetings: Not on file  . Marital Status: Not on file  Intimate Partner Violence:   . Fear of Current or Ex-Partner: Not on file  . Emotionally Abused: Not on file  . Physically Abused: Not on file  . Sexually Abused: Not on file    Family History  Problem Relation Age of Onset  . Hypertension Mother   . Diabetes Mother   . Diabetes Father      Review of Systems  Constitutional: Negative.  Negative for chills and fever.  HENT: Negative.  Negative for congestion and sore throat.   Respiratory: Negative.  Negative for cough and hemoptysis.   Cardiovascular: Negative.  Negative for chest pain and palpitations.  Gastrointestinal: Negative for abdominal pain, diarrhea, nausea and vomiting.  Genitourinary: Negative.  Negative for dysuria and hematuria.  Skin: Negative.  Negative for rash.  Neurological: Negative for dizziness and headaches.  All other systems reviewed and are negative.    Today's Vitals   03/20/20 0759  BP: (!) 147/94  Pulse: 65  Resp: 16  Temp: 98.6 F (37 C)  TempSrc: Temporal  SpO2: 94%  Weight: 262 lb (118.8 kg)  Height: '5\' 11"'  (1.803 m)   Body mass index is 36.54 kg/m.   Physical Exam Vitals reviewed.  Constitutional:      Appearance: Normal appearance. He is obese.  HENT:     Head: Normocephalic.  Eyes:      Extraocular Movements: Extraocular movements intact.     Conjunctiva/sclera: Conjunctivae normal.     Pupils: Pupils are equal, round, and reactive to light.  Cardiovascular:     Rate and Rhythm: Normal rate and regular rhythm.     Pulses: Normal pulses.     Heart sounds: Normal heart sounds.  Pulmonary:     Effort: Pulmonary effort is normal.     Breath sounds: Normal breath sounds.  Musculoskeletal:        General: Normal range of motion.     Cervical back: Normal range of motion and neck supple.  Skin:    General: Skin is warm and dry.     Capillary Refill: Capillary refill takes less than 2 seconds.  Neurological:     General: No focal deficit present.     Mental Status: He is alert and oriented to person, place, and time.  Psychiatric:        Mood and Affect: Mood normal.        Behavior: Behavior normal.    Results for orders placed or performed in visit on 03/20/20 (from the past 24 hour(s))  POCT glucose (manual entry)     Status: Abnormal   Collection Time: 03/20/20  8:32 AM  Result Value Ref Range   POC Glucose 118 (A) 70 - 99 mg/dl  POCT glycosylated hemoglobin (Hb A1C)     Status: Abnormal   Collection Time: 03/20/20  8:43 AM  Result Value Ref Range   Hemoglobin A1C 6.8 (A) 4.0 - 5.6 %   HbA1c POC (<> result, manual entry)     HbA1c, POC (prediabetic range)     HbA1c, POC (controlled diabetic range)     A total of 45 minutes was spent with the patient, greater than 50% of which was in counseling/coordination of care regarding diabetes, hypertension, and dyslipidemia, cardiovascular risks associated with these conditions, starting new medications Metformin, lisinopril, and rosuvastatin, review of most recent blood work results including today's hemoglobin A1c, diet and nutrition, review of most recent office visit notes, prognosis and need for follow-up in 3 months.    ASSESSMENT & PLAN: Hypertension associated with diabetes ([place]) Well-controlled hypertension.   Will start lisinopril 20 mg daily. Uncontrolled diabetes with hemoglobin A1c higher than before at 6.8.  Will start Metformin 500 mg twice a day. We'll start rosuvastatin 10 mg daily. Diet and nutrition discussed. Follow-up in 3 months. Paras was seen today for diabetes.  Diagnoses and all orders for this visit:  Hypertension associated with diabetes ([place]) -     CBC with Differential/Platelet -     Comprehensive metabolic panel -     Lipid panel -     POCT glucose (manual entry) -     POCT glycosylated hemoglobin (Hb A1C) -     lisinopril (ZESTRIL) 20 MG tablet; Take 1 tablet (20 mg total) by mouth daily.  Body mass index (BMI) of 36.0-36.9 in adult  Type 2 diabetes mellitus with hyperglycemia, without long-term current use of insulin ([facility]) -     metFORMIN (GLUCOPHAGE) 500 MG tablet; Take 1 tablet (500 mg total) by mouth 2 (two) times daily with a meal. -     rosuvastatin (CRESTOR) 10 MG tablet; Take 1 tablet (10 mg total) by mouth daily.    Patient Instructions       If you have lab work done today you will be contacted with your lab results within the next 2 weeks.  If you have not heard from [name] then please contact us. The fastest way to get your results is to register for My Chart.   IF you received an x-ray today, you will receive an invoice from [facility]. Please contact [facility] at [phone] with questions or concerns regarding your invoice.   IF you received labwork today, you will receive an invoice from [facility]. Please contact [facility] at [phone] with questions or concerns regarding your invoice.   Our billing staff will not be able to assist you with questions regarding bills from these companies.  You will be contacted with the lab results as soon  as they are available. The fastest way to get your results is to activate your My Chart account. Instructions are located on the last page of this paperwork. If you have not heard from [name]  regarding the results in 2 weeks, please contact this office.     Diabetes mellitus y nutricin, en adultos Diabetes Mellitus and Nutrition, Adult Si sufre de diabetes (diabetes mellitus), es muy importante tener hbitos alimenticios saludables debido a que sus niveles de Designer, television/film set sangre (glucosa) se ven afectados en gran medida por lo que come y bebe. [place] alimentos saludables en las cantidades [place], aproximadamente a la [employer], [place] ayudar a:  Aeronautical engineer glucemia.  Disminuir el riesgo de sufrir una enfermedad cardaca.  Mejorar la presin arterial.  Science writer o mantener un peso saludable. Todas las personas que sufren de diabetes son diferentes y cada una tiene necesidades diferentes en cuanto a un plan de alimentacin. El mdico puede recomendarle que trabaje con un especialista en dietas y nutricin (nutricionista) para Financial trader plan para usted. Su plan de alimentacin puede variar segn factores como:  Las caloras que necesita.  Los medicamentos que toma.  Su peso.  Sus niveles de glucemia, presin arterial y colesterol.  Su nivel de [place].  Otras afecciones que tenga, como enfermedades cardacas o renales. Cmo me afectan los carbohidratos? Los carbohidratos, o hidratos de carbono, afectan su nivel de glucemia ms que cualquier otro tipo de alimento. La ingesta de carbohidratos naturalmente aumenta la cantidad de [employer]. El recuento de carbohidratos es un mtodo destinado a Catering manager un registro de la cantidad de carbohidratos que se consumen. El recuento de carbohidratos es importante para Theatre manager la glucemia a un nivel saludable, especialmente si utiliza insulina o toma determinados medicamentos por va oral para la diabetes. Es importante conocer la cantidad de carbohidratos que se pueden ingerir en cada comida sin correr Engineer, manufacturing. Esto es Psychologist, forensic. Su nutricionista puede ayudarlo a calcular la  cantidad de carbohidratos que debe ingerir en cada comida y en cada refrigerio. Entre los alimentos que contienen carbohidratos, se incluyen:  Pan, cereal, arroz, pastas y galletas.  Papas y maz.  Guisantes, frijoles y lentejas.  Leche y Estate agent.  Lambert Mody y [place].  Postres, como pasteles, galletas, helado y caramelos. Cmo me afecta el alcohol? El alcohol puede provocar disminuciones sbitas de la glucemia (hipoglucemia), especialmente si utiliza insulina o toma determinados medicamentos por va oral para la diabetes. La hipoglucemia es una afeccin potencialmente mortal. Los sntomas de la hipoglucemia (somnolencia, mareos y confusin) son similares a los sntomas de haber consumido demasiado alcohol. Si el mdico afirma que el alcohol es seguro para usted, [place] estas pautas:  Limite el consumo de alcohol a no ms de 67mdida por da si es mujer y no est e[place] y a 220midas si es hombre. Una medida equivale a 12oz (35530mde cerveza, 5oz (148m66me vino o 1oz (44ml63m bebidas alcohlicas de alta graduacin.  No beba con el estmago vaco.  Mantngase hidratado bebiendo agua, refrescos dietticos o t helado sin azcar.  Tenga en cuenta que los refrescos comunes, los jugos y otras bebida para mezclOptician, dispensingen contener mucha azcar y se deben contar como carbohidratos. Cules son algunos consejos para seguir este plan?  Leer las etiquetas de los alimentos  Comience por leer el tamao de la porcin en la "Informacin nutricional" en las etiquetas de los alimentos envasados y las bebidas. La cantidad de caloras,  carbohidratos, grasas y otros nutrientes mencionados en la etiqueta se basan en una porcin del alimento. Muchos alimentos contienen ms de una porcin por envase.  Verifique la cantidad total de gramos (g) de carbohidratos totales en una porcin. Puede calcular la cantidad de porciones de carbohidratos al dividir el total de carbohidratos por 15. Por ejemplo, si un  alimento tiene un total de 30g de carbohidratos, equivale a 2 porciones de carbohidratos.  Verifique la cantidad de gramos (g) de grasas saturadas y grasas trans en una porcin. Escoja alimentos que no contengan grasa o que tengan un bajo contenido.  Verifique la cantidad de miligramos (mg) de sal (sodio) en una porcin. [place] [employer] de las personas deben limitar la ingesta de sodio total a menos de 2368m por dTraining and development officer  Siempre consulte la informacin nutricional de los alimentos etiquetados como "con bajo contenido de grasa" o "sin grasa". Estos alimentos pueden tener un mayor contenido de aLocation manageragregada o carbohidratos refinados, y deben evitarse.  Hable con su nutricionista para identificar sus objetivos diarios en cuanto a los nutrientes mencionados en la etiqueta. Al ir de compras  Evite comprar alimentos procesados, enlatados o precocinados. Estos alimentos tienden a tSpecial educational needs teachermayor cantidad de [place] sodio y azcar agregada.  Compre en la zona exterior de la tienda de comestibles. Esta zona incluye frutas y verduras frescas, granos a granel, carnes frescas y productos lcteos frescos. Al cocinar  Utilice mtodos de coccin a baja temperatura, como hornear, en lugar de mtodos de coccin a alta temperatura, como frer en abundante aceite.  Cocine con aceites saludables, como el aceite de oPort Jefferson canola o [place]  Evite cocinar con manteca, crema o carnes con alto contenido de grasa. Planificacin de las comidas  Coma las comidas y los refrigerios regularmente, preferentemente a la misma hora todos lMon[place] Evite pasar largos perodos de tiempo sin comer.  Consuma alimentos ricos en fibra, como frutas frescas, verduras, frijoles y cereales integrales. Consulte a su nutricionista sobre cuntas porciones de carbohidratos puede consumir en cada comida.  Consuma entre 4 y 6 onzas (oz) de protenas magras por da, como carnes mKnapp pollo, pescado, huevos o tofu. Una onza de protena magra  equivale a: ? 1 onza de carne, pollo o pescado. ? 1huevo. ?  taza de tofu.  Coma algunos alimentos por da que contengan grasas saludables, como aguacates, frutos secos, semillas y pescado. Estilo de vida  Controle su nivel de glucemia con regularidad.  Haga actividad fsica habitualmente como se lo haya indicado el mdico. Esto puede incluir lo siguiente: ? 1545mutos semanales de ejercicio de intensidad moderada o alta. Esto podra incluir caminatas dinmicas, ciclismo o gimnasia acutica. ? Realizar ejercicios de elongacin y de fortalecimiento, como yoga o levantamiento de pesas, por lo menos 2veces por semana.  Tome los meTenneco Ince lo haya indicado el mdico.  No consuma ningn producto que contenga nicotina o tabaco, como cigarrillos y ciPsychologist, sport and exerciseSi necesita ayuda para dejar de fumar, consulte al mdHess Corporationon un asesor o instructor en diabetes para identificar estrategias para controlar el estrs y cualquier desafo emocional y social. Preguntas para hacerle al mdico  Es necesario que consulte a unRadio broadcast assistantn el cuidado de la diabetes?  Es necesario que me rena con un nutricionista?  A qu nmero puedo llamar si tengo preguntas?  Cules son los mejores momentos para controlar la glucemia? Dnde encontrar ms informacin:  Asociacin Estadounidense de la Diabetes (American Diabetes Association): diabetes.org  Academia de Nutricin y  Diettica (Academy of Nutrition and Dietetics): www.eatright.[place] Diabetes y las Enfermedades Digestivas y [place] [facility] of Diabetes and Digestive and Kidney Diseases, NIH): [place]inesFuneral.dk Resumen  Un plan de alimentacin saludable lo ayudar a Aeronautical engineer glucemia y Theatre manager un estilo de vida saludable.  Trabajar con un especialista en dietas y nutricin (nutricionista) puede ayudarlo a Insurance claims handler de alimentacin para usted.  Tenga en cuenta  que los carbohidratos (hidratos de carbono) y el alcohol tienen efectos inmediatos en sus niveles de glucemia. Es importante contar los carbohidratos que ingiere y consumir alcohol con prudencia. Esta informacin no tiene Marine scientist el consejo del mdico. Asegrese de hacerle al mdico cualquier pregunta que tenga. Document Revised: 03/22/2017 Document Reviewed: 11/01/2016 Elsevier Patient Education  2020 Elsevier Inc.       Agustina Caroli, MD [facility]

## 2020-03-20 NOTE — Assessment & Plan Note (Signed)
Well-controlled hypertension.  Will start lisinopril 20 mg daily. Uncontrolled diabetes with hemoglobin A1c higher than before at 6.8.  Will start Metformin 500 mg twice a day. We'll start rosuvastatin 10 mg daily. Diet and nutrition discussed. Follow-up in 3 months.

## 2020-03-20 NOTE — Patient Instructions (Addendum)
   If you have lab work done today you will be contacted with your lab results within the next 2 weeks.  If you have not heard from us then please contact us. The fastest way to get your results is to register for My Chart.   IF you received an x-ray today, you will receive an invoice from [facility]. Please contact [facility] at [phone] with questions or concerns regarding your invoice.   IF you received labwork today, you will receive an invoice from [facility]. Please contact [facility] at [phone] with questions or concerns regarding your invoice.   Our billing staff will not be able to assist you with questions regarding bills from these companies.  You will be contacted with the lab results as soon as they are available. The fastest way to get your results is to activate your My Chart account. Instructions are located on the last page of this paperwork. If you have not heard from us regarding the results in 2 weeks, please contact this office.     Diabetes mellitus y nutricin, en adultos Diabetes Mellitus and Nutrition, Adult Si sufre de diabetes (diabetes mellitus), es muy importante tener hbitos alimenticios saludables debido a que sus niveles de azcar en la sangre (glucosa) se ven afectados en gran medida por lo que come y bebe. Comer alimentos saludables en las cantidades adecuadas, aproximadamente a la misma hora todos los das, lo ayudar a:  Controlar la glucemia.  Disminuir el riesgo de sufrir una enfermedad cardaca.  Mejorar la presin arterial.  Alcanzar o mantener un peso saludable. Todas las personas que sufren de diabetes son diferentes y cada una tiene necesidades diferentes en cuanto a un plan de alimentacin. El mdico puede recomendarle que trabaje con un especialista en dietas y nutricin (nutricionista) para elaborar el mejor plan para usted. Su plan de alimentacin puede variar segn factores como:  Las caloras que  necesita.  Los medicamentos que toma.  Su peso.  Sus niveles de glucemia, presin arterial y colesterol.  Su nivel de actividad.  Otras afecciones que tenga, como enfermedades cardacas o renales. Cmo me afectan los carbohidratos? Los carbohidratos, o hidratos de carbono, afectan su nivel de glucemia ms que cualquier otro tipo de alimento. La ingesta de carbohidratos naturalmente aumenta la cantidad de glucosa en la sangre. El recuento de carbohidratos es un mtodo destinado a llevar un registro de la cantidad de carbohidratos que se consumen. El recuento de carbohidratos es importante para mantener la glucemia a un nivel saludable, especialmente si utiliza insulina o toma determinados medicamentos por va oral para la diabetes. Es importante conocer la cantidad de carbohidratos que se pueden ingerir en cada comida sin correr ningn riesgo. Esto es diferente en cada persona. Su nutricionista puede ayudarlo a calcular la cantidad de carbohidratos que debe ingerir en cada comida y en cada refrigerio. Entre los alimentos que contienen carbohidratos, se incluyen:  Pan, cereal, arroz, pastas y galletas.  Papas y maz.  Guisantes, frijoles y lentejas.  Leche y yogur.  Frutas y jugo.  Postres, como pasteles, galletas, helado y caramelos. Cmo me afecta el alcohol? El alcohol puede provocar disminuciones sbitas de la glucemia (hipoglucemia), especialmente si utiliza insulina o toma determinados medicamentos por va oral para la diabetes. La hipoglucemia es una afeccin potencialmente mortal. Los sntomas de la hipoglucemia (somnolencia, mareos y confusin) son similares a los sntomas de haber consumido demasiado alcohol. Si el mdico afirma que el alcohol es seguro para usted, siga estas pautas:    Limite el consumo de alcohol a no ms de 1medida por da si es mujer y no est embarazada, y a 2medidas si es hombre. Una medida equivale a 12oz (355ml) de cerveza, 5oz (148ml) de vino o  1oz (44ml) de bebidas alcohlicas de alta graduacin.  No beba con el estmago vaco.  Mantngase hidratado bebiendo agua, refrescos dietticos o t helado sin azcar.  Tenga en cuenta que los refrescos comunes, los jugos y otras bebida para mezclar pueden contener mucha azcar y se deben contar como carbohidratos. Cules son algunos consejos para seguir este plan?  Leer las etiquetas de los alimentos  Comience por leer el tamao de la porcin en la "Informacin nutricional" en las etiquetas de los alimentos envasados y las bebidas. La cantidad de caloras, carbohidratos, grasas y otros nutrientes mencionados en la etiqueta se basan en una porcin del alimento. Muchos alimentos contienen ms de una porcin por envase.  Verifique la cantidad total de gramos (g) de carbohidratos totales en una porcin. Puede calcular la cantidad de porciones de carbohidratos al dividir el total de carbohidratos por 15. Por ejemplo, si un alimento tiene un total de 30g de carbohidratos, equivale a 2 porciones de carbohidratos.  Verifique la cantidad de gramos (g) de grasas saturadas y grasas trans en una porcin. Escoja alimentos que no contengan grasa o que tengan un bajo contenido.  Verifique la cantidad de miligramos (mg) de sal (sodio) en una porcin. La mayora de las personas deben limitar la ingesta de sodio total a menos de 2300mg por da.  Siempre consulte la informacin nutricional de los alimentos etiquetados como "con bajo contenido de grasa" o "sin grasa". Estos alimentos pueden tener un mayor contenido de azcar agregada o carbohidratos refinados, y deben evitarse.  Hable con su nutricionista para identificar sus objetivos diarios en cuanto a los nutrientes mencionados en la etiqueta. Al ir de compras  Evite comprar alimentos procesados, enlatados o precocinados. Estos alimentos tienden a tener una mayor cantidad de grasa, sodio y azcar agregada.  Compre en la zona exterior de la tienda de  comestibles. Esta zona incluye frutas y verduras frescas, granos a granel, carnes frescas y productos lcteos frescos. Al cocinar  Utilice mtodos de coccin a baja temperatura, como hornear, en lugar de mtodos de coccin a alta temperatura, como frer en abundante aceite.  Cocine con aceites saludables, como el aceite de oliva, canola o girasol.  Evite cocinar con manteca, crema o carnes con alto contenido de grasa. Planificacin de las comidas  Coma las comidas y los refrigerios regularmente, preferentemente a la misma hora todos los das. Evite pasar largos perodos de tiempo sin comer.  Consuma alimentos ricos en fibra, como frutas frescas, verduras, frijoles y cereales integrales. Consulte a su nutricionista sobre cuntas porciones de carbohidratos puede consumir en cada comida.  Consuma entre 4 y 6 onzas (oz) de protenas magras por da, como carnes magras, pollo, pescado, huevos o tofu. Una onza de protena magra equivale a: ? 1 onza de carne, pollo o pescado. ? 1huevo. ?  taza de tofu.  Coma algunos alimentos por da que contengan grasas saludables, como aguacates, frutos secos, semillas y pescado. Estilo de vida  Controle su nivel de glucemia con regularidad.  Haga actividad fsica habitualmente como se lo haya indicado el mdico. Esto puede incluir lo siguiente: ? 150minutos semanales de ejercicio de intensidad moderada o alta. Esto podra incluir caminatas dinmicas, ciclismo o gimnasia acutica. ? Realizar ejercicios de elongacin y de fortalecimiento, como yoga o levantamiento   de pesas, por lo menos 2veces por semana.  Tome los medicamentos como se lo haya indicado el mdico.  No consuma ningn producto que contenga nicotina o tabaco, como cigarrillos y cigarrillos electrnicos. Si necesita ayuda para dejar de fumar, consulte al mdico.  Trabaje con un asesor o instructor en diabetes para identificar estrategias para controlar el estrs y cualquier desafo emocional  y social. Preguntas para hacerle al mdico  Es necesario que consulte a un instructor en el cuidado de la diabetes?  Es necesario que me rena con un nutricionista?  A qu nmero puedo llamar si tengo preguntas?  Cules son los mejores momentos para controlar la glucemia? Dnde encontrar ms informacin:  Asociacin Estadounidense de la Diabetes (American Diabetes Association): diabetes.org  Academia de Nutricin y Diettica (Academy of Nutrition and Dietetics): www.eatright.org  Instituto Nacional de la Diabetes y las Enfermedades Digestivas y Renales (National Institute of Diabetes and Digestive and Kidney Diseases, NIH): www.niddk.nih.gov Resumen  Un plan de alimentacin saludable lo ayudar a controlar la glucemia y mantener un estilo de vida saludable.  Trabajar con un especialista en dietas y nutricin (nutricionista) puede ayudarlo a elaborar el mejor plan de alimentacin para usted.  Tenga en cuenta que los carbohidratos (hidratos de carbono) y el alcohol tienen efectos inmediatos en sus niveles de glucemia. Es importante contar los carbohidratos que ingiere y consumir alcohol con prudencia. Esta informacin no tiene como fin reemplazar el consejo del mdico. Asegrese de hacerle al mdico cualquier pregunta que tenga. Document Revised: 03/22/2017 Document Reviewed: 11/01/2016 Elsevier Patient Education  2020 Elsevier Inc.  

## 2020-03-21 LAB — COMPREHENSIVE METABOLIC PANEL
ALT: 89 IU/L — ABNORMAL HIGH (ref 0–44)
AST: 54 IU/L — ABNORMAL HIGH (ref 0–40)
Albumin/Globulin Ratio: 1.6 (ref 1.2–2.2)
Albumin: 4.6 g/dL (ref 4.0–5.0)
Alkaline Phosphatase: 83 IU/L (ref 48–121)
BUN/Creatinine Ratio: 15 (ref 9–20)
BUN: 10 mg/dL (ref 6–24)
Bilirubin Total: 1.4 mg/dL — ABNORMAL HIGH (ref 0.0–1.2)
CO2: 21 mmol/L (ref 20–29)
Calcium: 9 mg/dL (ref 8.7–10.2)
Chloride: 104 mmol/L (ref 96–106)
Creatinine, Ser: 0.66 mg/dL — ABNORMAL LOW (ref 0.76–1.27)
GFR calc Af Amer: 134 mL/min/{1.73_m2} (ref >59)
GFR calc non Af Amer: 116 mL/min/{1.73_m2} (ref >59)
Globulin, Total: 2.9 g/dL (ref 1.5–4.5)
Glucose: 120 mg/dL — ABNORMAL HIGH (ref 65–99)
Potassium: 4.2 mmol/L (ref 3.5–5.2)
Sodium: 140 mmol/L (ref 134–144)
Total Protein: 7.5 g/dL (ref 6.0–8.5)

## 2020-03-21 LAB — CBC WITH DIFFERENTIAL/PLATELET
Basophils Absolute: 0 10*3/uL (ref 0.0–0.2)
Basos: 1 %
EOS (ABSOLUTE): 0.3 10*3/uL (ref 0.0–0.4)
Eos: 6 %
Hematocrit: 49.7 % (ref 37.5–51.0)
Hemoglobin: 16.5 g/dL (ref 13.0–17.7)
Immature Grans (Abs): 0 10*3/uL (ref 0.0–0.1)
Immature Granulocytes: 0 %
Lymphocytes Absolute: 1.8 10*3/uL (ref 0.7–3.1)
Lymphs: 34 %
MCH: 30.8 pg (ref 26.6–33.0)
MCHC: 33.2 g/dL (ref 31.5–35.7)
MCV: 93 fL (ref 79–97)
Monocytes Absolute: 0.4 10*3/uL (ref 0.1–0.9)
Monocytes: 8 %
Neutrophils Absolute: 2.8 10*3/uL (ref 1.4–7.0)
Neutrophils: 51 %
Platelets: 202 10*3/uL (ref 150–450)
RBC: 5.35 x10E6/uL (ref 4.14–5.80)
RDW: 12.3 % (ref 11.6–15.4)
WBC: 5.4 10*3/uL (ref 3.4–10.8)

## 2020-03-21 LAB — LIPID PANEL
Chol/HDL Ratio: 4.8 ratio (ref 0.0–5.0)
Cholesterol, Total: 179 mg/dL (ref 100–199)
HDL: 37 mg/dL — ABNORMAL LOW (ref >39)
LDL Chol Calc (NIH): 116 mg/dL — ABNORMAL HIGH (ref 0–99)
Triglycerides: 144 mg/dL (ref 0–149)
VLDL Cholesterol Cal: 26 mg/dL (ref 5–40)

## 2020-04-04 ENCOUNTER — Ambulatory Visit: Payer: BC Managed Care – PPO | Admitting: Podiatry

## 2020-06-26 ENCOUNTER — Ambulatory Visit: Payer: BC Managed Care – PPO | Admitting: Emergency Medicine

## 2020-07-10 ENCOUNTER — Ambulatory Visit: Payer: BC Managed Care – PPO | Admitting: Emergency Medicine

## 2020-08-18 ENCOUNTER — Telehealth: Payer: Self-pay | Admitting: Emergency Medicine

## 2020-08-18 ENCOUNTER — Other Ambulatory Visit: Payer: Self-pay

## 2020-08-18 DIAGNOSIS — E1165 Type 2 diabetes mellitus with hyperglycemia: Secondary | ICD-10-CM

## 2020-08-18 MED ORDER — METFORMIN HCL 500 MG PO TABS: 500.0000 mg | ORAL_TABLET | Freq: Two times a day (BID) | ORAL | 3 refills | Status: DC

## 2020-08-18 NOTE — Telephone Encounter (Signed)
What is the name of the medication? lisinopril (ZESTRIL) 20 MG tablet [[id number],  metFORMIN (GLUCOPHAGE) 500 MG tablet [[id number]] and rosuvastatin (CRESTOR) 10 MG tablet [[id number]].     Have you contacted your pharmacy to request a refill? Yes, he needs a new script. He has rescheduled several times. He is out of town.    Which pharmacy would you like this sent to? Pharmacy  [facility], [address]  [place], [place] [address]  Phone:  [phone] Fax:  [phone]  DEA #:  [id number]      Patient notified that their request is being sent to the clinical staff for review and that they should receive a call once it is complete. If they do not receive a call within 72 hours they can check with their pharmacy or our office.

## 2020-08-18 NOTE — Telephone Encounter (Signed)
Sent!

## 2020-08-19 ENCOUNTER — Other Ambulatory Visit: Payer: Self-pay

## 2020-08-19 DIAGNOSIS — I152 Hypertension secondary to endocrine disorders: Secondary | ICD-10-CM

## 2020-08-19 DIAGNOSIS — E1165 Type 2 diabetes mellitus with hyperglycemia: Secondary | ICD-10-CM

## 2020-08-19 DIAGNOSIS — E1159 Type 2 diabetes mellitus with other circulatory complications: Secondary | ICD-10-CM

## 2020-08-19 DIAGNOSIS — K219 Gastro-esophageal reflux disease without esophagitis: Secondary | ICD-10-CM

## 2020-08-19 MED ORDER — OMEPRAZOLE 20 MG PO CPDR: DELAYED_RELEASE_CAPSULE | ORAL | 0 refills | Status: DC

## 2020-08-19 MED ORDER — LISINOPRIL 20 MG PO TABS: 20.0000 mg | ORAL_TABLET | Freq: Every day | ORAL | 3 refills | Status: DC

## 2020-08-19 MED ORDER — ROSUVASTATIN CALCIUM 10 MG PO TABS: 10.0000 mg | ORAL_TABLET | Freq: Every day | ORAL | 3 refills | Status: DC

## 2020-08-19 NOTE — Telephone Encounter (Signed)
Sent along with Prilosec as that will be due next month

## 2020-08-19 NOTE — Telephone Encounter (Signed)
Pt called to state that he is still missing the lisinopril and rosuvastatin, please advise   [facility]  [place] [address]  Phone: [phone] Fax: [phone]

## 2020-08-21 ENCOUNTER — Ambulatory Visit: Payer: BC Managed Care – PPO | Admitting: Emergency Medicine

## 2020-08-30 ENCOUNTER — Other Ambulatory Visit: Payer: Self-pay | Admitting: Emergency Medicine

## 2020-08-30 DIAGNOSIS — J301 Allergic rhinitis due to pollen: Secondary | ICD-10-CM

## 2020-08-30 NOTE — Telephone Encounter (Signed)
Requested Prescriptions  Pending Prescriptions Disp Refills  . loratadine (CLARITIN) 10 MG tablet [Pharmacy Med Name: LORATADINE 10MG  TABLETS] 90 tablet 3    Sig: TAKE 1 TABLET(10 MG) BY MOUTH DAILY     Ear, Nose, and Throat:  Antihistamines Passed - 08/30/2020  7:05 AM      Passed - Valid encounter within last 12 months    Recent Outpatient Visits          5 months ago Hypertension associated with diabetes [facility])   Primary Care at [facility], [place], MD   11 months ago Type 2 diabetes mellitus with hyperglycemia, without long-term current use of insulin [facility])   Primary Care at [facility], Ines Bloomer, MD   1 year ago Hypertension associated with diabetes [facility])   Primary Care at [place], [place], MD   1 year ago Type 2 diabetes mellitus with hyperglycemia, without long-term current use of insulin [facility]   Primary Care at [facility], [place], MD   1 year ago Type 2 diabetes mellitus with hyperglycemia, without long-term current use of insulin [facility])   Primary Care at [place], MD      Future Appointments            In 2 weeks Carlota Raspberry Ranell Patrick, MD Primary Care at [place], [facility]

## 2020-09-12 ENCOUNTER — Telehealth: Payer: Self-pay | Admitting: Emergency Medicine

## 2020-09-12 DIAGNOSIS — J301 Allergic rhinitis due to pollen: Secondary | ICD-10-CM

## 2020-09-12 MED ORDER — FLUTICASONE PROPIONATE 50 MCG/ACT NA SUSP: NASAL | 2 refills | Status: DC

## 2020-09-12 MED ORDER — LORATADINE 10 MG PO TABS: 10.0000 mg | ORAL_TABLET | Freq: Every day | ORAL | 1 refills | Status: DC

## 2020-09-12 NOTE — Telephone Encounter (Signed)
Patient in [place] and will be coming back to [place] next weekend. Patient called to make sure prescriptions were called in to pharmacy for his allergies. Patient stated he was prescribed 2 medications.    [facility] [id number]  [place], [place] [address]  Phone:  [phone] Fax:  [phone]  DEA #:  [id number]  Please advise at [phone].

## 2020-09-12 NOTE — Telephone Encounter (Signed)
RX sent to pharmacy  

## 2020-09-19 ENCOUNTER — Ambulatory Visit: Payer: BC Managed Care – PPO | Admitting: Family Medicine

## 2020-10-13 ENCOUNTER — Other Ambulatory Visit: Payer: Self-pay

## 2020-10-13 ENCOUNTER — Ambulatory Visit: Payer: BC Managed Care – PPO | Admitting: Emergency Medicine

## 2020-10-13 ENCOUNTER — Encounter: Payer: Self-pay | Admitting: Emergency Medicine

## 2020-10-13 VITALS — BP 126/80 | HR 57 | Temp 98.8°F | Resp 16 | Ht 71.0 in | Wt 245.0 lb

## 2020-10-13 DIAGNOSIS — E1159 Type 2 diabetes mellitus with other circulatory complications: Secondary | ICD-10-CM | POA: Diagnosis not present

## 2020-10-13 DIAGNOSIS — J301 Allergic rhinitis due to pollen: Secondary | ICD-10-CM

## 2020-10-13 DIAGNOSIS — E1169 Type 2 diabetes mellitus with other specified complication: Secondary | ICD-10-CM | POA: Diagnosis not present

## 2020-10-13 DIAGNOSIS — E1165 Type 2 diabetes mellitus with hyperglycemia: Secondary | ICD-10-CM

## 2020-10-13 DIAGNOSIS — Z9989 Dependence on other enabling machines and devices: Secondary | ICD-10-CM

## 2020-10-13 DIAGNOSIS — I152 Hypertension secondary to endocrine disorders: Secondary | ICD-10-CM

## 2020-10-13 DIAGNOSIS — G4733 Obstructive sleep apnea (adult) (pediatric): Secondary | ICD-10-CM | POA: Diagnosis not present

## 2020-10-13 DIAGNOSIS — E785 Hyperlipidemia, unspecified: Secondary | ICD-10-CM

## 2020-10-13 DIAGNOSIS — K219 Gastro-esophageal reflux disease without esophagitis: Secondary | ICD-10-CM

## 2020-10-13 DIAGNOSIS — Z1211 Encounter for screening for malignant neoplasm of colon: Secondary | ICD-10-CM

## 2020-10-13 LAB — POCT GLYCOSYLATED HEMOGLOBIN (HGB A1C): Hemoglobin A1C: 5.7 % — AB (ref 4.0–5.6)

## 2020-10-13 LAB — GLUCOSE, POCT (MANUAL RESULT ENTRY): POC Glucose: 95 mg/dl (ref 70–99)

## 2020-10-13 MED ORDER — LORATADINE 10 MG PO TABS: 10.0000 mg | ORAL_TABLET | Freq: Every day | ORAL | 1 refills | Status: DC

## 2020-10-13 MED ORDER — ROSUVASTATIN CALCIUM 10 MG PO TABS: 10.0000 mg | ORAL_TABLET | Freq: Every day | ORAL | 3 refills | Status: DC

## 2020-10-13 MED ORDER — OMEPRAZOLE 20 MG PO CPDR: DELAYED_RELEASE_CAPSULE | ORAL | 0 refills | Status: DC

## 2020-10-13 MED ORDER — CLOTRIMAZOLE-BETAMETHASONE 1-0.05 % EX CREA: 1.0000 "application " | TOPICAL_CREAM | Freq: Two times a day (BID) | CUTANEOUS | 2 refills | Status: DC

## 2020-10-13 MED ORDER — FLUTICASONE PROPIONATE 50 MCG/ACT NA SUSP: NASAL | 2 refills | Status: DC

## 2020-10-13 MED ORDER — METFORMIN HCL 500 MG PO TABS: 500.0000 mg | ORAL_TABLET | Freq: Two times a day (BID) | ORAL | 3 refills | Status: DC

## 2020-10-13 NOTE — Patient Instructions (Addendum)
If you have lab work done today you will be contacted with your lab results within the next 2 weeks.  If you have not heard from [name] then please contact us. The fastest way to get your results is to register for My Chart.   IF you received an x-ray today, you will receive an invoice from [facility]. Please contact [facility] at [phone] with questions or concerns regarding your invoice.   IF you received labwork today, you will receive an invoice from [facility]. Please contact [facility] at [phone] with questions or concerns regarding your invoice.   Our billing staff will not be able to assist you with questions regarding bills from these companies.  You will be contacted with the lab results as soon as they are available. The fastest way to get your results is to activate your My Chart account. Instructions are located on the last page of this paperwork. If you have not heard from [name] regarding the results in 2 weeks, please contact this office.     Diabetes mellitus y nutricin, en adultos Diabetes Mellitus and Nutrition, Adult Si sufre de diabetes, o diabetes mellitus, es muy importante tener hbitos alimenticios saludables debido a que sus niveles de Designer, television/film set sangre (glucosa) se ven afectados en gran medida por lo que come y bebe. Comer alimentos saludables en las cantidades correctas, aproximadamente a la misma hora todos los [place], [place] ayudar a:  Aeronautical engineer glucemia.  Disminuir el riesgo de sufrir una enfermedad cardaca.  Mejorar la presin arterial.  Science writer o mantener un peso saludable. Qu puede afectar mi plan de alimentacin? Todas las personas que sufren de diabetes son diferentes y cada una tiene necesidades diferentes en cuanto a un plan de alimentacin. El mdico puede recomendarle que trabaje con un nutricionista para elaborar el mejor plan para usted. Su plan de alimentacin puede variar segn factores como:  Las caloras que  necesita.  Los medicamentos que toma.  Su peso.  Sus niveles de glucemia, presin arterial y colesterol.  Su nivel de [place].  Otras afecciones que tenga, como enfermedades cardacas o renales. Cmo me afectan los carbohidratos? Los carbohidratos, o hidratos de carbono, afectan su nivel de glucemia ms que cualquier otro tipo de alimento. La ingesta de carbohidratos naturalmente aumenta la cantidad de [employer]. El recuento de carbohidratos es un mtodo destinado a Catering manager un registro de la cantidad de carbohidratos que se consumen. El recuento de carbohidratos es importante para Theatre manager la glucemia a un nivel saludable, especialmente si utiliza insulina o toma determinados medicamentos por va oral para la diabetes. Es importante conocer la cantidad de carbohidratos que se pueden ingerir en cada comida sin correr Engineer, manufacturing. Esto es Psychologist, forensic. Su nutricionista puede ayudarlo a calcular la cantidad de carbohidratos que debe ingerir en cada comida y en cada refrigerio. Cmo me afecta el alcohol? El alcohol puede provocar disminuciones sbitas de la glucemia (hipoglucemia), especialmente si utiliza insulina o toma determinados medicamentos por va oral para la diabetes. La hipoglucemia es una afeccin potencialmente mortal. Los sntomas de la hipoglucemia, como somnolencia, mareos y confusin, son similares a los sntomas de haber consumido demasiado alcohol.  No beba alcohol si: ? Su mdico le indica no hacerlo. ? Est embarazada, puede estar embarazada o est tratando de quedar embarazada.  Si bebe alcohol: ? No beba con el estmago vaco. ? Limite la cantidad que bebe:  De 0 a 1 medida por da para las  mujeres.  De 0 a 2 medidas por da para los hombres. ? Est atento a la cantidad de alcohol que hay en las bebidas que toma. En los [place], una medida equivale a una botella de cerveza de 12oz (336ml), un vaso de vino de 5oz (140ml) o un vaso  de una bebida alcohlica de alta graduacin de 1oz (50ml). ? Mantngase hidratado bebiendo agua, refrescos dietticos o t helado sin azcar.  Tenga en cuenta que los refrescos comunes, los jugos y otras bebida para Optician, dispensing pueden contener mucha azcar y se deben contar como carbohidratos. Consejos para seguir Photographer las etiquetas de los alimentos  Comience por leer el tamao de la porcin en la "Informacin nutricional" en las etiquetas de los alimentos envasados y las bebidas. La cantidad de caloras, carbohidratos, grasas y otros nutrientes mencionados en la etiqueta se basan en una porcin del alimento. Muchos alimentos contienen ms de una porcin por envase.  Verifique la cantidad total de gramos (g) de carbohidratos totales en una porcin. Puede calcular la cantidad de porciones de carbohidratos al dividir el total de carbohidratos por 15. Por ejemplo, si un alimento tiene un total de 30g de carbohidratos totales por porcin, equivale a 2 porciones de carbohidratos.  Verifique la cantidad de gramos (g) de grasas saturadas y grasas trans de una porcin. Escoja alimentos que no contengan estas grasas o que su contenido de estas sea [place].  Verifique la cantidad de miligramos (mg) de sal (sodio) en una porcin. La mayora de las personas deben limitar la ingesta de sodio total a menos de 2300mg  por Training and development officer.  Siempre consulte la informacin nutricional de los alimentos etiquetados como "con bajo contenido de grasa" o "sin grasa". Estos alimentos pueden tener un mayor contenido de Location manager agregada o carbohidratos refinados, y deben evitarse.  Hable con su nutricionista para identificar sus objetivos diarios en cuanto a los nutrientes mencionados en la etiqueta. Al ir de compras  Evite comprar alimentos procesados, enlatados o precocidos. Estos alimentos tienden a Special educational needs teacher mayor cantidad de [place], sodio y azcar agregada.  Compre en la zona exterior de la tienda de comestibles. Esta es  la zona donde se encuentran con mayor frecuencia las frutas y las verduras frescas, los cereales a granel, las carnes frescas y los productos lcteos frescos. Al cocinar  Utilice mtodos de coccin a baja temperatura, como hornear, en lugar de mtodos de coccin a alta temperatura, como frer en abundante aceite.  Cocine con aceites saludables, como el aceite de [place], canola o [place].  Evite cocinar con manteca, crema o carnes con alto contenido de grasa. Planificacin de las comidas  Coma las comidas y los refrigerios regularmente, preferentemente a la misma hora todos [place]. Evite pasar largos perodos de tiempo sin comer.  Consuma alimentos ricos en fibra, como frutas frescas, verduras, frijoles y cereales integrales. Consulte a su nutricionista sobre cuntas porciones de carbohidratos puede consumir en cada comida.  Consuma entre 4 y 6 onzas (entre 112 y 168g) de protenas magras por da, como carnes magras, pollo, pescado, huevos o tofu. Una onza (oz) de protena magra equivale a: ? 1 onza (28g) de carne, pollo o pescado. ? 1huevo. ?  de taza (62 g) de tofu.  Coma algunos alimentos por da que contengan grasas saludables, como aguacates, frutos secos, semillas y pescado.   Qu alimentos debo comer? Lambert Mody Bayas. Manzanas. Naranjas. Duraznos. Damascos. Ciruelas. Uvas. Mango. Papaya. [place]. Kiwi. Cerezas. [place] Valeda Malm. [place]. Verduras de [employer], que incluyen  col rizada, acelga, hojas de berza y de [place]. Remolachas. Coliflor. Repollo. Brcoli. Zanahorias. Judas verdes. Tomates. Pimientos. Cebollas. Pepinos. Coles de Bruselas. Granos Granos integrales, como panes, galletas, tortillas, cereales y pastas de salvado o integrales. Avena sin azcar. Quinua. Arroz integral o salvaje. Carnes y Psychiatric nurse. Carne de ave sin piel. Cortes magros de ave y carne de res. Tofu. Frutos secos. Semillas. Lcteos Productos lcteos sin grasa o con bajo contenido de  [place], [place], yogur y [place]. Es posible que los productos que se enumeran ms [place] no constituyan una lista completa de los alimentos y las bebidas que puede tomar. Consulte a un nutricionista para obtener ms informacin. Qu alimentos debo evitar? Lambert Mody Frutas enlatadas al almbar. Verduras Verduras enlatadas. Verduras congeladas con mantequilla o salsa de crema. Granos Productos elaborados con [place] y [place], como panes, pastas, bocadillos y cereales. Evite todos los alimentos procesados. Carnes y otras protenas Cortes de carne con alto contenido de Lobbyist. Carne de ave con piel. Carnes empanizadas o fritas. Carne procesada. Evite las grasas saturadas. Lcteos Yogur, [place] enteros. Bebidas Bebidas azucaradas, como gaseosas o t helado. Es posible que los productos que se enumeran ms [place] no constituyan una lista completa de los alimentos y las bebidas que Nurse, adult. Consulte a un nutricionista para obtener ms informacin. Preguntas para hacerle al mdico  Es necesario que me rena con Radio broadcast assistant en el cuidado de la diabetes?  Es necesario que me rena con un nutricionista?  A qu nmero puedo llamar si tengo preguntas?  Cules son los mejores momentos para controlar la glucemia? Dnde encontrar ms informacin:  Asociacin Estadounidense de la Diabetes (American Diabetes Association): diabetes.org  Academy of Nutrition and Dietetics (Academia de Nutricin y Information systems manager): www.eatright.[employer] of Diabetes and Digestive and Kidney Diseases ([place] la Diabetes y [place] y Renales): [employer]MoinesFuneral.dk  Association of Diabetes Care and Education Specialists (Asociacin de Especialistas en Atencin y Educacin sobre la Diabetes): www.diabeteseducator.org Resumen  Es importante tener hbitos alimenticios saludables debido a que sus niveles de Designer, television/film set sangre (glucosa) se ven afectados en  gran medida por lo que come y bebe.  Un plan de alimentacin saludable lo ayudar a controlar la glucemia y Theatre manager un estilo de vida saludable.  El mdico puede recomendarle que trabaje con un nutricionista para elaborar el mejor plan para usted.  Tenga en cuenta que los carbohidratos (hidratos de carbono) y el alcohol tienen efectos inmediatos en sus niveles de glucemia. Es importante contar los carbohidratos que ingiere y consumir alcohol con prudencia. Esta informacin no tiene Marine scientist el consejo del mdico. Asegrese de hacerle al mdico cualquier pregunta que tenga. Document Revised: 08/16/2019 Document Reviewed: 08/16/2019 Elsevier Patient Education  2021 [employer].

## 2020-10-13 NOTE — Assessment & Plan Note (Signed)
Well-controlled hypertension.  Continue lisinopril 20 mg daily. Uncontrolled diabetes with hemoglobin A1c of 5.7.  Continue Metformin 500 mg twice a day. Continue rosuvastatin 10 mg daily. Diet and nutrition discussed. Follow-up in 6 months.

## 2020-10-13 NOTE — Progress Notes (Signed)
Dominic [name] 45 y.o.   Chief Complaint  Patient presents with  . Diabetes    Follow up     HISTORY OF PRESENT ILLNESS: This is a 45 y.o. male with history of diabetes hypertension and dyslipidemia here for follow-up Doing well.  Losing weight and exercising more. Has no complaints or medical concerns today. Presently on lisinopril 20 mg daily, Metformin 500 mg twice a day, and rosuvastatin 10 mg daily. Lab Results  Component Value Date   HGBA1C 6.8 (A) 03/20/2020    HPI   Prior to Admission medications   Medication Sig Start Date End Date Taking? Authorizing Provider  Accu-Chek FastClix Lancets MISC USE TO TEST BLOOD SUGAR UP TO 4 TIMES A DAY 07/11/18   [provider]  ACCU-CHEK GUIDE test strip USE TO TEST BLOOD SUGAR UP TO 4 TIMES A DAY 07/11/18   [provider]  blood glucose meter kit and supplies KIT Dispense based on patient and insurance preference. Use up to four times daily as directed. (FOR ICD-9 250.00, 250.01). 07/11/18   Horald Pollen, MD  clotrimazole-betamethasone (LOTRISONE) cream Apply 1 application topically 2 (two) times daily. 08/21/19   Horald Pollen, MD  fluticasone [facility]) 50 MCG/ACT nasal spray Use one spay in each nare as needed. 09/12/20   Horald Pollen, MD  lisinopril (ZESTRIL) 20 MG tablet Take 1 tablet (20 mg total) by mouth daily. 08/19/20   Horald Pollen, MD  loratadine (CLARITIN) 10 MG tablet Take 1 tablet (10 mg total) by mouth daily. 09/12/20   Horald Pollen, MD  metFORMIN (GLUCOPHAGE) 500 MG tablet Take 1 tablet (500 mg total) by mouth 2 (two) times daily with a meal. 08/18/20 11/16/20  Horald Pollen, MD  omeprazole (PRILOSEC) 20 MG capsule TAKE 1 CAPSULE(20 MG) BY MOUTH DAILY 08/19/20   Horald Pollen, MD  rosuvastatin (CRESTOR) 10 MG tablet Take 1 tablet (10 mg total) by mouth daily. 08/19/20   Horald Pollen, MD    No Known Allergies  Patient Active Problem List    Diagnosis Date Noted  . Dyslipidemia associated with type 2 diabetes mellitus ([place]) 04/21/2018  . OSA on CPAP 01/15/2016  . Hypertension associated with diabetes ([place]) 09/22/2015  . Obesity 05/31/2014    Past Medical History:  Diagnosis Date  . Allergy   . GERD (gastroesophageal reflux disease)   . Hernia   . Hyperlipidemia     Past Surgical History:  Procedure Laterality Date  . GROIN DISSECTION  11/19/2011   Procedure: Virl Son EXPLORATION;  Surgeon: Harl Bowie, MD;  Location: [facility];  Service: General;  Laterality: Right;  Right Groin Exploration  . INGUINAL HERNIA REPAIR  2012   right  . INGUINAL HERNIA REPAIR  11/19/2011   Procedure: HERNIA REPAIR INGUINAL ADULT;  Surgeon: Harl Bowie, MD;  Location: [facility];  Service: General;  Laterality: Right;  excision of right groin mass    Social History   Socioeconomic History  . Marital status: Married    Spouse name: Not on file  . Number of children: Not on file  . Years of education: Not on file  . Highest education level: Not on file  Occupational History    Comment: Demolition work  Tobacco Use  . Smoking status: Former Smoker    Packs/day: 0.25    Years: 3.00    Pack years: 0.75    Types: Cigarettes  . Smokeless tobacco: Never Used  Substance and Sexual Activity  .  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 Drug use: No  . Sexual activity: Not on file  Other Topics Concern  . Not on file  Social History Narrative   Marital status: ; from [place]; [place] 25 years ago.      Children:  4 children; no grandchildren      Tobacco: none   Social Determinants of Radio broadcast assistant Strain: Not on file  Food Insecurity: Not on file  Transportation Needs: Not on file  Physical Activity: Not on file  Stress: Not on file  Social Connections: Not on file  Intimate Partner Violence: Not on file    Family History  Problem Relation Age of Onset  . Hypertension Mother   . Diabetes  Mother   . Diabetes Father      Review of Systems  Constitutional: Negative.  Negative for chills and fever.  HENT: Negative.  Negative for congestion and sore throat.   Respiratory: Negative.  Negative for cough and shortness of breath.   Cardiovascular: Negative.  Negative for chest pain and palpitations.  Gastrointestinal: Negative.  Negative for abdominal pain, blood in stool, diarrhea, melena, nausea and vomiting.  Genitourinary: Negative.  Negative for dysuria and hematuria.  Musculoskeletal: Negative.   Skin: Negative.  Negative for rash.  Neurological: Negative.  Negative for dizziness and headaches.  All other systems reviewed and are negative.   Today's Vitals   10/13/20 1336  BP: (!) 146/84  Pulse: (!) 57  Resp: 16  Temp: 98.8 F (37.1 C)  TempSrc: Temporal  SpO2: 97%  Weight: 245 lb (111.1 kg)  Height: 5' 11" (1.803 m)   Body mass index is 34.17 kg/m. Wt Readings from Last 3 Encounters:  10/13/20 245 lb (111.1 kg)  03/20/20 262 lb (118.8 kg)  09/20/19 247 lb (112 kg)    Physical Exam Vitals reviewed.  Constitutional:      Appearance: Normal appearance.  HENT:     Head: Normocephalic.  Eyes:     Extraocular Movements: Extraocular movements intact.     Conjunctiva/sclera: Conjunctivae normal.     Pupils: Pupils are equal, round, and reactive to light.  Cardiovascular:     Rate and Rhythm: Normal rate and regular rhythm.     Pulses: Normal pulses.     Heart sounds: Normal heart sounds.  Pulmonary:     Effort: Pulmonary effort is normal.     Breath sounds: Normal breath sounds.  Abdominal:     General: There is no distension.     Palpations: Abdomen is soft.     Tenderness: There is no abdominal tenderness.  Musculoskeletal:        General: Normal range of motion.     Cervical back: Normal range of motion and neck supple.  Skin:    General: Skin is warm and dry.     Capillary Refill: Capillary refill takes less than 2 seconds.  Neurological:      General: No focal deficit present.     Mental Status: He is alert and oriented to person, place, and time.  Psychiatric:        Mood and Affect: Mood normal.        Behavior: Behavior normal.    Results for orders placed or performed in visit on 10/13/20 (from the past 24 hour(s))  POCT glucose (manual entry)     Status: None   Collection Time: 10/13/20  1:51 PM  Result Value Ref Range  POC Glucose 95 70 - 99 mg/dl  POCT glycosylated hemoglobin (Hb A1C)     Status: Abnormal   Collection Time: 10/13/20  1:57 PM  Result Value Ref Range   Hemoglobin A1C 5.7 (A) 4.0 - 5.6 %   HbA1c POC (<> result, manual entry)     HbA1c, POC (prediabetic range)     HbA1c, POC (controlled diabetic range)       ASSESSMENT & PLAN: Hypertension associated with diabetes ([place]) Well-controlled hypertension.  Continue lisinopril 20 mg daily. Uncontrolled diabetes with hemoglobin A1c of 5.7.  Continue Metformin 500 mg twice a day. Continue rosuvastatin 10 mg daily. Diet and nutrition discussed. Follow-up in 6 months.  Tomislav was seen today for diabetes.  Diagnoses and all orders for this visit:  Hypertension associated with diabetes ([place]) -     POCT glucose (manual entry) -     POCT glycosylated hemoglobin (Hb A1C) -     CMP14+EGFR -     Lipid panel -     Ambulatory referral to Ophthalmology  Dyslipidemia associated with type 2 diabetes mellitus (HCC)  OSA on CPAP  Chronic seasonal allergic rhinitis due to pollen -     fluticasone (FLONASE) 50 MCG/ACT nasal spray; Use one spay in each nare as needed.  Allergic rhinitis due to pollen -     loratadine (CLARITIN) 10 MG tablet; Take 1 tablet (10 mg total) by mouth daily.  Type 2 diabetes mellitus with hyperglycemia, without long-term current use of insulin (HCC) -     metFORMIN (GLUCOPHAGE) 500 MG tablet; Take 1 tablet (500 mg total) by mouth 2 (two) times daily with a meal. -     rosuvastatin (CRESTOR) 10 MG tablet; Take 1 tablet (10 mg total)  by mouth daily.  Gastroesophageal reflux disease -     omeprazole (PRILOSEC) 20 MG capsule; TAKE 1 CAPSULE(20 MG) BY MOUTH DAILY  Screening for colon cancer -     Ambulatory referral to Gastroenterology  Other orders -     clotrimazole-betamethasone (LOTRISONE) cream; Apply 1 application topically 2 (two) times daily.    Patient Instructions       If you have lab work done today you will be contacted with your lab results within the next 2 weeks.  If you have not heard from [name] then please contact us. The fastest way to get your results is to register for My Chart.   IF you received an x-ray today, you will receive an invoice from [facility] Radiology. Please contact [facility] Radiology at [phone] with questions or concerns regarding your invoice.   IF you received labwork today, you will receive an invoice from [facility]. Please contact [facility] at [phone] with questions or concerns regarding your invoice.   Our billing staff will not be able to assist you with questions regarding bills from these companies.  You will be contacted with the lab results as soon as they are available. The fastest way to get your results is to activate your My Chart account. Instructions are located on the last page of this paperwork. If you have not heard from [name] regarding the results in 2 weeks, please contact this office.     Diabetes mellitus y nutricin, en adultos Diabetes Mellitus and Nutrition, Adult Si sufre de diabetes, o diabetes mellitus, es muy importante tener hbitos alimenticios saludables debido a que sus niveles de Designer, television/film set sangre (glucosa) se ven afectados en gran medida por lo que come y bebe. Comer alimentos saludables en las  cantidades correctas, aproximadamente a la [employer], lo ayudar a:  Aeronautical engineer glucemia.  Disminuir el riesgo de sufrir una enfermedad cardaca.  Mejorar la presin arterial.  Science writer o mantener un peso saludable. Qu  puede afectar mi plan de alimentacin? Todas las personas que sufren de diabetes son diferentes y cada una tiene necesidades diferentes en cuanto a un plan de alimentacin. El mdico puede recomendarle que trabaje con un nutricionista para elaborar el mejor plan para usted. Su plan de alimentacin puede variar segn factores como:  Las caloras que necesita.  Los medicamentos que toma.  Su peso.  Sus niveles de glucemia, presin arterial y colesterol.  Su nivel de [place].  Otras afecciones que tenga, como enfermedades cardacas o renales. Cmo me afectan los carbohidratos? Los carbohidratos, o hidratos de carbono, afectan su nivel de glucemia ms que cualquier otro tipo de alimento. La ingesta de carbohidratos naturalmente aumenta la cantidad de [employer]. El recuento de carbohidratos es un mtodo destinado a Catering manager un registro de la cantidad de carbohidratos que se consumen. El recuento de carbohidratos es importante para Theatre manager la glucemia a un nivel saludable, especialmente si utiliza insulina o toma determinados medicamentos por va oral para la diabetes. Es importante conocer la cantidad de carbohidratos que se pueden ingerir en cada comida sin correr Engineer, manufacturing. Esto es Psychologist, forensic. Su nutricionista puede ayudarlo a calcular la cantidad de carbohidratos que debe ingerir en cada comida y en cada refrigerio. Cmo me afecta el alcohol? El alcohol puede provocar disminuciones sbitas de la glucemia (hipoglucemia), especialmente si utiliza insulina o toma determinados medicamentos por va oral para la diabetes. La hipoglucemia es una afeccin potencialmente mortal. Los sntomas de la hipoglucemia, como somnolencia, mareos y confusin, son similares a los sntomas de haber consumido demasiado alcohol.  No beba alcohol si: ? Su mdico le indica no hacerlo. ? Est embarazada, puede estar embarazada o est tratando de quedar embarazada.  Si bebe alcohol: ? No  beba con el estmago vaco. ? Limite la cantidad que bebe:  De 0 a 1 medida por da para las mujeres.  De 0 a 2 medidas por da para los hombres. ? Est atento a la cantidad de alcohol que hay en las bebidas que toma. En los Miller, una medida equivale a una botella de cerveza de 12oz (314m), un vaso de vino de 5oz (1420m o un vaso de una bebida alcohlica de alta graduacin de 1oz (4438m ? Mantngase hidratado bebiendo agua, refrescos dietticos o t helado sin azcar.  Tenga en cuenta que los refrescos comunes, los jugos y otras bebida para mezOptician, dispensingeden contener mucha azcar y se deben contar como carbohidratos. Consejos para seguir estPhotographers etiquetas de los alimentos  Comience por leer el tamao de la porcin en la "Informacin nutricional" en las etiquetas de los alimentos envasados y las bebidas. La cantidad de caloras, carbohidratos, grasas y otros nutrientes mencionados en la etiqueta se basan en una porcin del alimento. Muchos alimentos contienen ms de una porcin por envase.  Verifique la cantidad total de gramos (g) de carbohidratos totales en una porcin. Puede calcular la cantidad de porciones de carbohidratos al dividir el total de carbohidratos por 15. Por ejemplo, si un alimento tiene un total de 30g de carbohidratos totales por porcin, equivale a 2 porciones de carbohidratos.  Verifique la cantidad de gramos (g) de grasas saturadas y grasas trans de una porcin. Escoja alimentos que no contengan  estas grasas o que su contenido de estas sea Gary.  Verifique la cantidad de miligramos (mg) de sal (sodio) en una porcin. La [employer] de las personas deben limitar la ingesta de sodio total a menos de 2343m por dTraining and development officer  Siempre consulte la informacin nutricional de los alimentos etiquetados como "con bajo contenido de grasa" o "sin grasa". Estos alimentos pueden tener un mayor contenido de aLocation manageragregada o carbohidratos refinados, y deben  evitarse.  Hable con su nutricionista para identificar sus objetivos diarios en cuanto a los nutrientes mencionados en la etiqueta. Al ir de compras  Evite comprar alimentos procesados, enlatados o precocidos. Estos alimentos tienden a tSpecial educational needs teachermayor cantidad de gFairfield sodio y azcar agregada.  Compre en la zona exterior de la tienda de comestibles. Esta es la zona donde se encuentran con mayor frecuencia las frutas y las verduras frescas, los cereales a granel, las carnes frescas y los productos lcteos frescos. Al cocinar  Utilice mtodos de coccin a baja temperatura, como hornear, en lugar de mtodos de coccin a alta temperatura, como frer en abundante aceite.  Cocine con aceites saludables, como el aceite de oRichmond canola o gMount T[place]  Evite cocinar con manteca, crema o carnes con alto contenido de grasa. Planificacin de las comidas  Coma las comidas y los refrigerios regularmente, preferentemente a la misma hora todos lMandan Evite pasar largos perodos de tiempo sin comer.  Consuma alimentos ricos en fibra, como frutas frescas, verduras, frijoles y cereales integrales. Consulte a su nutricionista sobre cuntas porciones de carbohidratos puede consumir en cada comida.  Consuma entre 4 y 6 onzas (entre 112 y 168g) de protenas magras por da, como carnes magras, pollo, pescado, huevos o tofu. Una onza (oz) de protena magra equivale a: ? 1 onza (28g) de carne, pollo o pescado. ? 1huevo. ?  de taza (62 g) de tofu.  Coma algunos alimentos por da que contengan grasas saludables, como aguacates, frutos secos, semillas y pescado.   Qu alimentos debo comer? FLambert ModyBayas. Manzanas. Naranjas. Duraznos. Damascos. Ciruelas. Uvas. Mango. Papaya. GOdell Kiwi. Cerezas. VHolland CommonsLValeda Malm Espinaca. Verduras de hBoeing que incluyen col rizada, aMadison hojas de bIraqy de mAshaway Remolachas. Coliflor. Repollo. Brcoli. Zanahorias. Judas verdes. Tomates. Pimientos. Cebollas.  Pepinos. Coles de Bruselas. Granos Granos integrales, como panes, galletas, tortillas, cereales y pastas de salvado o integrales. Avena sin azcar. Quinua. Arroz integral o salvaje. Carnes y oPsychiatric nurse Carne de ave sin piel. Cortes magros de ave y carne de res. Tofu. Frutos secos. Semillas. Lcteos Productos lcteos sin grasa o con bajo contenido de gLoveland cBrant Lake South yogur y qBeaverton Es posible que los productos que se enumeran ms aNew Caledoniano constituyan una lista completa de los alimentos y las bebidas que puede tomar. Consulte a un nutricionista para obtener ms informacin. Qu alimentos debo evitar? FLambert ModyFrutas enlatadas al almbar. Verduras Verduras enlatadas. Verduras congeladas con mantequilla o salsa de crema. Granos Productos elaborados con hIsraely hLao People's Democratic Republic como panes, pastas, bocadillos y cereales. Evite todos los alimentos procesados. Carnes y otras protenas Cortes de carne con alto contenido de gLobbyist Carne de ave con piel. Carnes empanizadas o fritas. Carne procesada. Evite las grasas saturadas. Lcteos Yogur, qMaalaeaenteros. Bebidas Bebidas azucaradas, como gaseosas o t helado. Es posible que los productos que se enumeran ms aNew Caledoniano constituyan una lista completa de los alimentos y las bebidas que dNurse, adult Consulte a un nutricionista para obtener ms informacin. Preguntas para hacerle al mdico  Es necesario que me rena con Radio broadcast assistant en el cuidado de la diabetes?  Es necesario que me rena con un nutricionista?  A qu nmero puedo llamar si tengo preguntas?  Cules son los mejores momentos para controlar la glucemia? Dnde encontrar ms informacin:  Asociacin Estadounidense de la Diabetes (American Diabetes Association): diabetes.org  Academy of Nutrition and Dietetics (Academia de Nutricin y Information systems manager): www.eatright.CSX Corporation of Diabetes and Digestive and Kidney Diseases ([place] la Diabetes y [place] y [place]): DesMoinesFuneral.dk  Association of Diabetes Care and Education Specialists (Asociacin de Especialistas en Atencin y Educacin sobre la Diabetes): www.diabeteseducator.org Resumen  Es importante tener hbitos alimenticios saludables debido a que sus niveles de Designer, television/film set sangre (glucosa) se ven afectados en gran medida por lo que come y bebe.  Un plan de alimentacin saludable lo ayudar a controlar la glucemia y Theatre manager un estilo de vida saludable.  El mdico puede recomendarle que trabaje con un nutricionista para elaborar el mejor plan para usted.  Tenga en cuenta que los carbohidratos (hidratos de carbono) y el alcohol tienen efectos inmediatos en sus niveles de glucemia. Es importante contar los carbohidratos que ingiere y consumir alcohol con prudencia. Esta informacin no tiene Marine scientist el consejo del mdico. Asegrese de hacerle al mdico cualquier pregunta que tenga. Document Revised: 08/16/2019 Document Reviewed: 08/16/2019 Elsevier Patient Education  2021 Elsevier Inc.      Agustina Caroli, MD [facility]

## 2020-10-14 LAB — LIPID PANEL
Chol/HDL Ratio: 3.1 ratio (ref 0.0–5.0)
Cholesterol, Total: 122 mg/dL (ref 100–199)
HDL: 39 mg/dL — ABNORMAL LOW (ref >39)
LDL Chol Calc (NIH): 61 mg/dL (ref 0–99)
Triglycerides: 120 mg/dL (ref 0–149)
VLDL Cholesterol Cal: 22 mg/dL (ref 5–40)

## 2020-10-14 LAB — CMP14+EGFR
ALT: 28 IU/L (ref 0–44)
AST: 23 IU/L (ref 0–40)
Albumin/Globulin Ratio: 1.6 (ref 1.2–2.2)
Albumin: 4.7 g/dL (ref 4.0–5.0)
Alkaline Phosphatase: 73 IU/L (ref 44–121)
BUN/Creatinine Ratio: 18 (ref 9–20)
BUN: 11 mg/dL (ref 6–24)
Bilirubin Total: 1.5 mg/dL — ABNORMAL HIGH (ref 0.0–1.2)
CO2: 19 mmol/L — ABNORMAL LOW (ref 20–29)
Calcium: 9.5 mg/dL (ref 8.7–10.2)
Chloride: 103 mmol/L (ref 96–106)
Creatinine, Ser: 0.62 mg/dL — ABNORMAL LOW (ref 0.76–1.27)
Globulin, Total: 3 g/dL (ref 1.5–4.5)
Glucose: 165 mg/dL — ABNORMAL HIGH (ref 65–99)
Potassium: 3.8 mmol/L (ref 3.5–5.2)
Sodium: 140 mmol/L (ref 134–144)
Total Protein: 7.7 g/dL (ref 6.0–8.5)
eGFR: 119 mL/min/{1.73_m2} (ref >59)

## 2020-10-16 ENCOUNTER — Telehealth: Payer: Self-pay | Admitting: Emergency Medicine

## 2020-10-16 NOTE — Telephone Encounter (Signed)
Patient received lab results and has questions. Patient is aware that provider needs time to review the results and get back to him. Please advise at [phone].

## 2021-01-22 ENCOUNTER — Encounter: Payer: Self-pay | Admitting: Podiatry

## 2021-01-22 ENCOUNTER — Ambulatory Visit (INDEPENDENT_AMBULATORY_CARE_PROVIDER_SITE_OTHER): Payer: BC Managed Care – PPO

## 2021-01-22 ENCOUNTER — Ambulatory Visit: Payer: BC Managed Care – PPO | Admitting: Podiatry

## 2021-01-22 ENCOUNTER — Other Ambulatory Visit: Payer: Self-pay

## 2021-01-22 DIAGNOSIS — M722 Plantar fascial fibromatosis: Secondary | ICD-10-CM | POA: Diagnosis not present

## 2021-01-22 MED ORDER — DICLOFENAC SODIUM 75 MG PO TBEC: 75.0000 mg | DELAYED_RELEASE_TABLET | Freq: Two times a day (BID) | ORAL | 2 refills | Status: DC

## 2021-01-22 MED ORDER — TRIAMCINOLONE ACETONIDE 10 MG/ML IJ SUSP
10.0000 mg | Freq: Once | INTRAMUSCULAR | Status: AC
  Administered 2021-01-22: 10 mg

## 2021-01-22 NOTE — Progress Notes (Signed)
Subjective:   Patient ID: Dominic [name], male   DOB: 45 y.o.   MRN: [id number]   HPI Patient presents stating his left heel has been bothering him a lot and his right once doing good after we treated it and he thinks he might need new orthotics neuro   ROS      Objective:  Physical Exam  Vascular status intact with exquisite discomfort plantar aspect left over right heel at the insertional point of the tendon into the calcaneus     Assessment:  Acute [place] fasciitis left over right     Plan:  H&P reviewed condition sterile prep injected the plantar fascial left 3 mg Kenalog 5 g liken applied fascial brace instructed on supportive therapy anti-inflammatories reappoint to recheck  X-rays indicate moderate depression of the arch bilateral no indication of spur formation or other pathology

## 2021-02-20 ENCOUNTER — Ambulatory Visit: Payer: BC Managed Care – PPO | Admitting: Podiatry

## 2021-02-21 ENCOUNTER — Other Ambulatory Visit: Payer: Self-pay | Admitting: Emergency Medicine

## 2021-02-21 DIAGNOSIS — K219 Gastro-esophageal reflux disease without esophagitis: Secondary | ICD-10-CM

## 2021-03-23 ENCOUNTER — Other Ambulatory Visit: Payer: Self-pay | Admitting: Emergency Medicine

## 2021-03-23 DIAGNOSIS — J301 Allergic rhinitis due to pollen: Secondary | ICD-10-CM

## 2021-04-06 ENCOUNTER — Other Ambulatory Visit: Payer: Self-pay

## 2021-04-07 ENCOUNTER — Ambulatory Visit: Payer: BC Managed Care – PPO | Admitting: Emergency Medicine

## 2021-04-07 ENCOUNTER — Encounter: Payer: Self-pay | Admitting: Emergency Medicine

## 2021-04-07 VITALS — BP 138/80 | HR 57 | Temp 98.8°F | Ht 71.0 in | Wt 242.0 lb

## 2021-04-07 DIAGNOSIS — E1169 Type 2 diabetes mellitus with other specified complication: Secondary | ICD-10-CM | POA: Diagnosis not present

## 2021-04-07 DIAGNOSIS — E785 Hyperlipidemia, unspecified: Secondary | ICD-10-CM

## 2021-04-07 DIAGNOSIS — Z9989 Dependence on other enabling machines and devices: Secondary | ICD-10-CM

## 2021-04-07 DIAGNOSIS — Z0000 Encounter for general adult medical examination without abnormal findings: Secondary | ICD-10-CM | POA: Diagnosis not present

## 2021-04-07 DIAGNOSIS — E1159 Type 2 diabetes mellitus with other circulatory complications: Secondary | ICD-10-CM | POA: Diagnosis not present

## 2021-04-07 DIAGNOSIS — I152 Hypertension secondary to endocrine disorders: Secondary | ICD-10-CM | POA: Diagnosis not present

## 2021-04-07 DIAGNOSIS — E1165 Type 2 diabetes mellitus with hyperglycemia: Secondary | ICD-10-CM

## 2021-04-07 DIAGNOSIS — G4733 Obstructive sleep apnea (adult) (pediatric): Secondary | ICD-10-CM

## 2021-04-07 LAB — CBC WITH DIFFERENTIAL/PLATELET
Basophils Absolute: 0 10*3/uL (ref 0.0–0.1)
Basophils Relative: 0.4 % (ref 0.0–3.0)
Eosinophils Absolute: 0.2 10*3/uL (ref 0.0–0.7)
Eosinophils Relative: 2.9 % (ref 0.0–5.0)
HCT: 47.6 % (ref 39.0–52.0)
Hemoglobin: 16.1 g/dL (ref 13.0–17.0)
Lymphocytes Relative: 34.7 % (ref 12.0–46.0)
Lymphs Abs: 2.5 10*3/uL (ref 0.7–4.0)
MCHC: 33.7 g/dL (ref 30.0–36.0)
MCV: 90.6 fl (ref 78.0–100.0)
Monocytes Absolute: 0.6 10*3/uL (ref 0.1–1.0)
Monocytes Relative: 8 % (ref 3.0–12.0)
Neutro Abs: 4 10*3/uL (ref 1.4–7.7)
Neutrophils Relative %: 54 % (ref 43.0–77.0)
Platelets: 214 10*3/uL (ref 150.0–400.0)
RBC: 5.26 Mil/uL (ref 4.22–5.81)
RDW: 13.5 % (ref 11.5–15.5)
WBC: 7.3 10*3/uL (ref 4.0–10.5)

## 2021-04-07 LAB — COMPREHENSIVE METABOLIC PANEL
ALT: 35 U/L (ref 0–53)
AST: 23 U/L (ref 0–37)
Albumin: 4.4 g/dL (ref 3.5–5.2)
Alkaline Phosphatase: 62 U/L (ref 39–117)
BUN: 10 mg/dL (ref 6–23)
CO2: 29 mEq/L (ref 19–32)
Calcium: 9.4 mg/dL (ref 8.4–10.5)
Chloride: 100 mEq/L (ref 96–112)
Creatinine, Ser: 0.65 mg/dL (ref 0.40–1.50)
GFR: 112.45 mL/min (ref >60.00)
Glucose, Bld: 95 mg/dL (ref 70–99)
Potassium: 4 mEq/L (ref 3.5–5.1)
Sodium: 136 mEq/L (ref 135–145)
Total Bilirubin: 1.4 mg/dL — ABNORMAL HIGH (ref 0.2–1.2)
Total Protein: 8.1 g/dL (ref 6.0–8.3)

## 2021-04-07 LAB — LIPID PANEL
Cholesterol: 173 mg/dL (ref 0–200)
HDL: 41.4 mg/dL (ref >39.00)
LDL Cholesterol: 101 mg/dL — ABNORMAL HIGH (ref 0–99)
NonHDL: 131.54
Total CHOL/HDL Ratio: 4
Triglycerides: 154 mg/dL — ABNORMAL HIGH (ref 0.0–149.0)
VLDL: 30.8 mg/dL (ref 0.0–40.0)

## 2021-04-07 LAB — HEMOGLOBIN A1C: Hgb A1c MFr Bld: 5.8 % (ref 4.6–6.5)

## 2021-04-07 MED ORDER — METFORMIN HCL 500 MG PO TABS: 500.0000 mg | ORAL_TABLET | Freq: Two times a day (BID) | ORAL | 3 refills | Status: DC

## 2021-04-07 MED ORDER — ACCU-CHEK GUIDE VI STRP: ORAL_STRIP | 5 refills | Status: DC

## 2021-04-07 MED ORDER — ACCU-CHEK FASTCLIX LANCETS MISC: 3 refills | Status: DC

## 2021-04-07 MED ORDER — CLOTRIMAZOLE-BETAMETHASONE 1-0.05 % EX CREA: 1.0000 "application " | TOPICAL_CREAM | Freq: Two times a day (BID) | CUTANEOUS | 2 refills | Status: DC

## 2021-04-07 NOTE — Patient Instructions (Signed)
Mantenimiento de Teacher, English as a foreign language, Male Adoptar un estilo de vida saludable y recibir atencin preventiva son importantes para promover la salud y Musician. Consulte al mdico sobre: El esquema adecuado para hacerse pruebas y exmenes peridicos. Cosas que puede hacer por su cuenta para prevenir enfermedades y Post sano. Qu debo saber sobre la dieta, el peso y el ejercicio? Consuma una dieta saludable  Consuma una dieta que incluya muchas verduras, frutas, productos lcteos con bajo contenido de [place] y Advertising account planner. No consuma muchos alimentos ricos en grasas slidas, azcares agregados o sodio. Mantenga un peso saludable El ndice de masa muscular [facility]) es una medida que puede utilizarse para identificar posibles problemas de [place]. Proporciona una estimacin de la grasa corporal basndose en el peso y la altura. Su mdico puede ayudarle a Radiation protection practitioner [place] y a Scientist, forensic o Theatre manager un peso saludable. Haga ejercicio con regularidad Haga ejercicio con regularidad. Esta es una de las prcticas ms importantes que puede hacer por su salud. [employer] de los adultos deben seguir estas pautas: Optometrist, al menos, 150 minutos de actividad fsica por semana. El ejercicio debe aumentar la frecuencia cardaca y Nature conservation officer transpirar (ejercicio de intensidad moderada). Hacer ejercicios de fortalecimiento por lo [employer] por semana. Agregue esto a su plan de ejercicio de intensidad moderada. Pasar menos tiempo sentados. Incluso la actividad fsica ligera puede ser beneficiosa. Controle sus niveles de colesterol y lpidos en la sangre Comience a realizarse anlisis de lpidos y Research officer, trade union en la sangre a los 20 aos y luego reptalos cada 5 aos. Es posible que Automotive engineer los niveles de colesterol con mayor frecuencia si: Sus niveles de lpidos y colesterol son altos. Es mayor de 67 aos. Presenta un alto riesgo de padecer enfermedades cardacas. Qu debo  saber sobre las pruebas de deteccin del cncer? Muchos tipos de cncer pueden detectarse de manera temprana y, a menudo, pueden prevenirse. Segn su historia clnica y sus antecedentes familiares, es posible que deba realizarse pruebas de deteccin del cncer en diferentes edades. Esto puede incluir pruebas de deteccin de lo siguiente: Surveyor, minerals. Cncer de prstata. Cncer de piel. Cncer de pulmn. Qu debo saber sobre la enfermedad cardaca, la diabetes y la hipertensin arterial? Presin arterial y enfermedad cardaca La hipertensin arterial causa enfermedades cardacas y [place] el riesgo de accidente cerebrovascular. Es ms probable que esto se manifieste en las personas que tienen lecturas de presin arterial alta, tienen ascendencia africana o tienen sobrepeso. Hable con el mdico sobre sus valores de presin arterial deseados. Hgase controlar la presin arterial: Cada 3 a 5 aos si tiene entre 18 y 67 aos. Todos los aos si es mayor de 40 aos. Si tiene entre 38 y 9 aos y es fumador o Insurance account manager, pregntele al mdico si debe realizarse una prueba de deteccin de aneurisma artico abdominal (AAA) por nica vez. Diabetes Realcese exmenes de deteccin de la diabetes con regularidad. Este anlisis revisa el nivel de azcar en la sangre en [place]. Hgase las pruebas de deteccin: Cada tres aos despus de los 52 aos de edad si tiene un peso normal y un bajo riesgo de padecer diabetes. Con ms frecuencia y a partir de [place] edad inferior si tiene sobrepeso o un alto riesgo de padecer diabetes. Qu debo saber sobre la prevencin de infecciones? Hepatitis B Si tiene un riesgo ms alto de contraer hepatitis B, debe someterse a un examen de deteccin de este virus. Hable con el mdico para averiguar si  tiene riesgo de contraer la infeccin por hepatitis B. Hepatitis C Se recomienda un anlisis de [place] para: Todos los que nacieron entre 1945 y [phone]. Todas las personas que  tengan un riesgo de haber contrado hepatitis C. Enfermedades de transmisin sexual (ETS) Debe realizarse pruebas de deteccin de ITS todos los aos, incluidas la gonorrea y la clamidia, si: Es sexualmente activo y es menor de 45 aos. Es mayor de 26 aos, y Investment banker, operational informa que corre riesgo de tener este tipo de infecciones. La actividad sexual ha cambiado desde que le hicieron la ltima prueba de deteccin y tiene un riesgo mayor de Best boy clamidia o Radio broadcast assistant. Pregntele al mdico si usted tiene riesgo. Pregntele al mdico si usted tiene un alto riesgo de Museum/gallery curator VIH. El mdico tambin puede recomendarle un medicamento recetado para ayudar a evitar la infeccin por el VIH. Si elige tomar medicamentos para prevenir el VIH, primero debe [employer] de deteccin del VIH. Luego debe hacerse anlisis cada 3 meses mientras est tomando los medicamentos. Siga estas instrucciones en su casa: Estilo de vida No consuma ningn producto que contenga nicotina o tabaco, como cigarrillos, cigarrillos electrnicos y tabaco de Higher education careers adviser. Si necesita ayuda para dejar de fumar, consulte al mdico. No consuma drogas. No comparta agujas. Solicite ayuda a su mdico si necesita apoyo o informacin para abandonar las drogas. Consumo de alcohol No beba alcohol si el mdico se lo prohbe. Si bebe alcohol: Limite la cantidad que consume de 0 a 2 medidas por da. Est atento a la cantidad de alcohol que hay en las bebidas que toma. En los [place], una medida equivale a una botella de cerveza de 12 oz (355 ml), un vaso de vino de 5 oz (148 ml) o un vaso de una bebida alcohlica de alta graduacin de 1 oz (44 ml). Instrucciones generales Realcese los estudios de rutina de la salud, dentales y de Public librarian. [place]. Infrmele a su mdico si: Se siente deprimido con frecuencia. Alguna vez ha sido vctima de [place] o no se siente seguro en su casa. Resumen Adoptar un estilo  de vida saludable y recibir atencin preventiva son importantes para promover la salud y Musician. Siga las instrucciones del mdico acerca de una dieta saludable, el ejercicio y la realizacin de pruebas o exmenes para Engineer, building services. Siga las instrucciones del mdico con respecto al control del colesterol y la presin arterial. Esta informacin no tiene Marine scientist el consejo del mdico. Asegrese de hacerle al mdico cualquier pregunta que tenga. Document Revised: 08/02/2018 Document Reviewed: 08/02/2018 Elsevier Patient Education  [place].

## 2021-04-07 NOTE — Progress Notes (Signed)
Dominic [name] 45 y.o.   Chief Complaint  Patient presents with   Annual Exam    Refill for diabetic strips and lancets, metformin    HISTORY OF PRESENT ILLNESS: This is a 45 y.o. male here for his annual exam. Has history of hypertension, diabetes, and dyslipidemia. Compliant with medications. Has no complaints or medical concerns today. Needs medication refills.  HPI   Prior to Admission medications   Medication Sig Start Date End Date Taking? Authorizing Provider  Accu-Chek FastClix Lancets MISC USE TO TEST BLOOD SUGAR UP TO 4 TIMES A DAY 07/11/18  Yes [provider]  ACCU-CHEK GUIDE test strip USE TO TEST BLOOD SUGAR UP TO 4 TIMES A DAY 07/11/18  Yes [provider]  blood glucose meter kit and supplies KIT Dispense based on patient and insurance preference. Use up to four times daily as directed. (FOR ICD-9 250.00, 250.01). 07/11/18  Yes Aamirah Salmi, Ines Bloomer, MD  clotrimazole-betamethasone (LOTRISONE) cream Apply 1 application topically 2 (two) times daily. 10/13/20  Yes Olive Zmuda, Ines Bloomer, MD  diclofenac (VOLTAREN) 75 MG EC tablet Take 1 tablet (75 mg total) by mouth 2 (two) times daily. 01/22/21  Yes Regal, Tamala Fothergill, DPM  fluticasone (FLONASE) 50 MCG/ACT nasal spray USE ONE SPRAY IN EACH NARE AS NEEDED 03/23/21  Yes Suan Pyeatt, Ines Bloomer, MD  lisinopril (ZESTRIL) 20 MG tablet Take 1 tablet (20 mg total) by mouth daily. 08/19/20  Yes Kaula Klenke, Ines Bloomer, MD  loratadine (CLARITIN) 10 MG tablet Take 1 tablet (10 mg total) by mouth daily. 10/13/20  Yes Horald Pollen, MD  metFORMIN (GLUCOPHAGE) 500 MG tablet Take 1 tablet (500 mg total) by mouth 2 (two) times daily with a meal. 10/13/20 04/07/21 Yes Gilmore List, Ines Bloomer, MD  omeprazole (PRILOSEC) 20 MG capsule TAKE 1 CAPSULE(20 MG) BY MOUTH DAILY 02/22/21  Yes Hillari Zumwalt, Ines Bloomer, MD  rosuvastatin (CRESTOR) 10 MG tablet Take 1 tablet (10 mg total) by mouth daily. 10/13/20  Yes Horald Pollen, MD     No Known Allergies  Patient Active Problem List   Diagnosis Date Noted   Dyslipidemia associated with type 2 diabetes mellitus ([place]) 04/21/2018   OSA on CPAP 01/15/2016   Hypertension associated with diabetes ([place]) 09/22/2015   Obesity 05/31/2014    Past Medical History:  Diagnosis Date   Allergy    GERD (gastroesophageal reflux disease)    Hernia    Hyperlipidemia     Past Surgical History:  Procedure Laterality Date   GROIN DISSECTION  11/19/2011   Procedure: GROIN EXPLORATION;  Surgeon: Harl Bowie, MD;  Location: [facility];  Service: General;  Laterality: Right;  Right Groin Exploration   INGUINAL HERNIA REPAIR  2012   right   INGUINAL HERNIA REPAIR  11/19/2011   Procedure: HERNIA REPAIR INGUINAL ADULT;  Surgeon: Harl Bowie, MD;  Location: [facility];  Service: General;  Laterality: Right;  excision of right groin mass    Social History   Socioeconomic History   Marital status: Married    Spouse name: Not on file   Number of children: Not on file   Years of education: Not on file   Highest education level: Not on file  Occupational History    Comment: Demolition work  Tobacco Use   Smoking status: Former    Packs/day: 0.25    Years: 3.00    Pack years: 0.75    Types: Cigarettes   Smokeless tobacco: Never  Substance and Sexual Activity  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      Children:  4 children; no grandchildren      Tobacco: none   Social Determinants of Radio broadcast assistant Strain: Not on file  Food Insecurity: Not on file  Transportation Needs: Not on file  Physical Activity: Not on file  Stress: Not on file  Social Connections: Not on file  Intimate Partner Violence: Not on file    Family History  Problem Relation Age of Onset   Hypertension  Mother    Diabetes Mother    Diabetes Father      Review of Systems  Constitutional: Negative.  Negative for chills and fever.  HENT: Negative.  Negative for congestion and sore throat.   Respiratory: Negative.  Negative for cough and shortness of breath.   Cardiovascular: Negative.  Negative for chest pain and palpitations.  Gastrointestinal: Negative.  Negative for abdominal pain, blood in stool, diarrhea, melena, nausea and vomiting.  Genitourinary: Negative.  Negative for dysuria and hematuria.  Musculoskeletal: Negative.  Negative for back pain, myalgias and neck pain.  Skin: Negative.  Negative for rash.  Neurological:  Negative for dizziness and headaches.  All other systems reviewed and are negative.   Physical Exam Vitals reviewed.  Constitutional:      Appearance: Normal appearance.  HENT:     Head: Normocephalic.     Right Ear: Tympanic membrane, ear canal and external ear normal.     Left Ear: Tympanic membrane, ear canal and external ear normal.     Mouth/Throat:     Mouth: Mucous membranes are moist.     Pharynx: Oropharynx is clear.  Eyes:     Extraocular Movements: Extraocular movements intact.     Conjunctiva/sclera: Conjunctivae normal.     Pupils: Pupils are equal, round, and reactive to light.  Cardiovascular:     Rate and Rhythm: Normal rate and regular rhythm.     Pulses: Normal pulses.     Heart sounds: Normal heart sounds.  Pulmonary:     Effort: Pulmonary effort is normal.     Breath sounds: Normal breath sounds.  Abdominal:     General: Bowel sounds are normal. There is no distension.     Palpations: Abdomen is soft. There is no mass.     Tenderness: There is no abdominal tenderness.  Musculoskeletal:        General: Normal range of motion.     Cervical back: Normal range of motion and neck supple.     Right lower leg: No edema.     Left lower leg: No edema.  Skin:    General: Skin is warm and dry.     Capillary Refill: Capillary refill  takes less than 2 seconds.  Neurological:     General: No focal deficit present.     Mental Status: He is alert and oriented to person, place, and time.  Psychiatric:        Mood and Affect: Mood normal.        Behavior: Behavior normal.     ASSESSMENT & PLAN: Donzell was seen today for annual exam.  Diagnoses and all orders for this visit:  Routine general medical examination at a health care facility  Hypertension associated with  diabetes ([place]) -     Cancel: POCT glycosylated hemoglobin (Hb A1C) -     CBC with Differential/Platelet -     Comprehensive metabolic panel -     Hemoglobin A1c  Dyslipidemia associated with type 2 diabetes mellitus (HCC) -     Lipid panel  OSA on CPAP  Type 2 diabetes mellitus with hyperglycemia, without long-term current use of insulin (HCC) -     metFORMIN (GLUCOPHAGE) 500 MG tablet; Take 1 tablet (500 mg total) by mouth 2 (two) times daily with a meal. -     ACCU-CHEK GUIDE test strip; USE TO TEST BLOOD SUGAR UP TO 4 TIMES A DAY -     Accu-Chek FastClix Lancets MISC; USE TO TEST BLOOD SUGAR UP TO 4 TIMES A DAY  Other orders -     clotrimazole-betamethasone (LOTRISONE) cream; Apply 1 application topically 2 (two) times daily.  Modifiable risk factors discussed with patient. Anticipatory guidance according to age provided. The following topics were also discussed: Social Determinants of Health Smoking Diet and nutrition and need to decrease amount of daily carbohydrate intake Benefits of exercise Cancer screening and need for colon cancer screening.  Patient scheduled for colonoscopy at the end of this month. Vaccinations recommendations Cardiovascular risk assessment with discussion of diabetes, hypertension, and dyslipidemia and cardiovascular risks associated with these conditions.  Medications reviewed. Mental health including depression and anxiety Fall and accident prevention  Patient Instructions  Mantenimiento de Hotel manager, Male Adoptar un estilo de vida saludable y recibir atencin preventiva son importantes para promover la salud y Musician. Consulte al mdico sobre: El esquema adecuado para hacerse pruebas y exmenes peridicos. Cosas que puede hacer por su cuenta para prevenir enfermedades y [place] sano. Qu debo saber sobre la dieta, el peso y el ejercicio? Consuma una dieta saludable  Consuma una dieta que incluya muchas verduras, frutas, productos lcteos con bajo contenido de [place] y Advertising account planner. No consuma muchos alimentos ricos en grasas slidas, azcares agregados o sodio. Mantenga un peso saludable El ndice de masa muscular [facility]) es una medida que puede utilizarse para identificar posibles problemas de [place]. Proporciona una estimacin de la grasa corporal basndose en el peso y la altura. Su mdico puede ayudarle a Radiation protection practitioner [place] y a Scientist, forensic o Theatre manager un peso saludable. Haga ejercicio con regularidad Haga ejercicio con regularidad. Esta es una de las prcticas ms importantes que puede hacer por su salud. [employer] de los adultos deben seguir estas pautas: Optometrist, al menos, 150 minutos de actividad fsica por semana. El ejercicio debe aumentar la frecuencia cardaca y Nature conservation officer transpirar (ejercicio de intensidad moderada). Hacer ejercicios de fortalecimiento por lo [employer] por semana. Agregue esto a su plan de ejercicio de intensidad moderada. Pasar menos tiempo sentados. Incluso la actividad fsica ligera puede ser beneficiosa. Controle sus niveles de colesterol y lpidos en la sangre Comience a realizarse anlisis de lpidos y Research officer, trade union en la sangre a los 23 aos y luego reptalos cada 5 aos. Es posible que Automotive engineer los niveles de colesterol con mayor frecuencia si: Sus niveles de lpidos y colesterol son altos. Es mayor de 42 aos. Presenta un alto riesgo de padecer enfermedades cardacas. Qu debo saber sobre las pruebas de  deteccin del cncer? Muchos tipos de cncer pueden detectarse de manera temprana y, a menudo, pueden prevenirse. Segn su historia clnica y sus antecedentes familiares, es posible que deba realizarse pruebas de deteccin del cncer en diferentes  edades. Esto puede incluir pruebas de deteccin de lo siguiente: Surveyor, minerals. Cncer de prstata. Cncer de piel. Cncer de pulmn. Qu debo saber sobre la enfermedad cardaca, la diabetes y la hipertensin arterial? Presin arterial y enfermedad cardaca La hipertensin arterial causa enfermedades cardacas y [place] el riesgo de accidente cerebrovascular. Es ms probable que esto se manifieste en las personas que tienen lecturas de presin arterial alta, tienen ascendencia africana o tienen sobrepeso. Hable con el mdico sobre sus valores de presin arterial deseados. Hgase controlar la presin arterial: Cada 3 a 5 aos si tiene entre 18 y 7 aos. Todos los aos si es mayor de 40 aos. Si tiene entre 51 y 75 aos y es fumador o Insurance account manager, pregntele al mdico si debe realizarse una prueba de deteccin de aneurisma artico abdominal (AAA) por nica vez. Diabetes Realcese exmenes de deteccin de la diabetes con regularidad. Este anlisis revisa el nivel de azcar en la sangre en [place]. Hgase las pruebas de deteccin: Cada tres aos despus de los 67 aos de edad si tiene un peso normal y un bajo riesgo de padecer diabetes. Con ms frecuencia y a partir de [place] edad inferior si tiene sobrepeso o un alto riesgo de padecer diabetes. Qu debo saber sobre la prevencin de infecciones? Hepatitis B Si tiene un riesgo ms alto de contraer hepatitis B, debe someterse a un examen de deteccin de este virus. Hable con el mdico para averiguar si tiene riesgo de contraer la infeccin por hepatitis B. Hepatitis C Se recomienda un anlisis de [place] para: Todos los que nacieron entre 1945 y [phone]. Todas las personas que tengan un riesgo de haber  contrado hepatitis C. Enfermedades de transmisin sexual (ETS) Debe realizarse pruebas de deteccin de ITS todos los aos, incluidas la gonorrea y la clamidia, si: Es sexualmente activo y es menor de 34 aos. Es mayor de 28 aos, y Investment banker, operational informa que corre riesgo de tener este tipo de infecciones. La actividad sexual ha cambiado desde que le hicieron la ltima prueba de deteccin y tiene un riesgo mayor de Best boy clamidia o Radio broadcast assistant. Pregntele al mdico si usted tiene riesgo. Pregntele al mdico si usted tiene un alto riesgo de Museum/gallery curator VIH. El mdico tambin puede recomendarle un medicamento recetado para ayudar a evitar la infeccin por el VIH. Si elige tomar medicamentos para prevenir el VIH, primero debe [employer] de deteccin del VIH. Luego debe hacerse anlisis cada 3 meses mientras est tomando los medicamentos. Siga estas instrucciones en su casa: Estilo de vida No consuma ningn producto que contenga nicotina o tabaco, como cigarrillos, cigarrillos electrnicos y tabaco de Higher education careers adviser. Si necesita ayuda para dejar de fumar, consulte al mdico. No consuma drogas. No comparta agujas. Solicite ayuda a su mdico si necesita apoyo o informacin para abandonar las drogas. Consumo de alcohol No beba alcohol si el mdico se lo prohbe. Si bebe alcohol: Limite la cantidad que consume de 0 a 2 medidas por da. Est atento a la cantidad de alcohol que hay en las bebidas que toma. En los [place], una medida equivale a una botella de cerveza de 12 oz (355 ml), un vaso de vino de 5 oz (148 ml) o un vaso de una bebida alcohlica de alta graduacin de 1 oz (44 ml). Instrucciones generales Realcese los estudios de rutina de la salud, dentales y de Public librarian. [place]. Infrmele a su mdico si: Se siente deprimido con frecuencia. Alguna vez ha sido  vctima de maltrato o no se siente seguro en su casa. Resumen Adoptar un estilo de vida saludable y recibir  atencin preventiva son importantes para promover la salud y Musician. Siga las instrucciones del mdico acerca de una dieta saludable, el ejercicio y la realizacin de pruebas o exmenes para Engineer, building services. Siga las instrucciones del mdico con respecto al control del colesterol y la presin arterial. Esta informacin no tiene Marine scientist el consejo del mdico. Asegrese de hacerle al mdico cualquier pregunta que tenga. Document Revised: 08/02/2018 Document Reviewed: 08/02/2018 Elsevier Patient Education  2022 [place], MD [facility]

## 2021-04-14 ENCOUNTER — Encounter: Payer: Self-pay | Admitting: Emergency Medicine

## 2021-04-17 ENCOUNTER — Ambulatory Visit (AMBULATORY_SURGERY_CENTER): Payer: BC Managed Care – PPO

## 2021-04-17 ENCOUNTER — Other Ambulatory Visit: Payer: Self-pay

## 2021-04-17 ENCOUNTER — Encounter: Payer: Self-pay | Admitting: Gastroenterology

## 2021-04-17 VITALS — Ht 71.0 in | Wt 242.0 lb

## 2021-04-17 DIAGNOSIS — Z1211 Encounter for screening for malignant neoplasm of colon: Secondary | ICD-10-CM

## 2021-04-17 MED ORDER — PLENVU 140 G PO SOLR: 1.0000 | ORAL | 0 refills | Status: DC

## 2021-04-17 NOTE — Progress Notes (Signed)
  Patient's pre-visit was done today over the phone with the patient   Name,DOB and address verified.   Patient denies any allergies to Eggs and Soy.  Patient denies any problems with anesthesia/sedation. Patient denies taking diet pills or blood thinners.  Denies atrial flutter or atrial fib Denies chronic constipation No home Oxygen.   Packet of Prep instructions mailed to patient including a copy of a consent form-pt is aware.  Patient understands to call us back with any questions or concerns.  Patient is aware of our care-partner policy and Covid-19 safety protocol.   The patient is COVID-19 vaccinated.    

## 2021-05-05 ENCOUNTER — Encounter: Payer: Self-pay | Admitting: Gastroenterology

## 2021-05-05 ENCOUNTER — Ambulatory Visit (AMBULATORY_SURGERY_CENTER): Payer: BC Managed Care – PPO | Admitting: Gastroenterology

## 2021-05-05 VITALS — BP 120/75 | HR 63 | Temp 98.4°F | Resp 17 | Ht 71.0 in | Wt 242.0 lb

## 2021-05-05 DIAGNOSIS — Z1211 Encounter for screening for malignant neoplasm of colon: Secondary | ICD-10-CM

## 2021-05-05 MED ORDER — SODIUM CHLORIDE 0.9 % IV SOLN: 500.0000 mL | Freq: Once | INTRAVENOUS | Status: DC

## 2021-05-05 NOTE — Progress Notes (Signed)
Pt's states no medical or surgical changes since previsit or office visit.  VS taken by [facility]

## 2021-05-05 NOTE — Progress Notes (Signed)
History and Physical:  This patient presents for endoscopic testing for: Encounter Diagnosis  Name Primary?   Special screening for malignant neoplasms, colon Yes    Average risk - first colonoscopy  ROS: Patient denies chest pain or cough   Past Medical History: Past Medical History:  Diagnosis Date   Allergy    Diabetes mellitus without complication ([place])    GERD (gastroesophageal reflux disease)    Hernia    Hyperlipidemia    Hypertension      Past Surgical History: Past Surgical History:  Procedure Laterality Date   GROIN DISSECTION  11/19/2011   Procedure: GROIN EXPLORATION;  Surgeon: Harl Bowie, MD;  Location: [facility];  Service: General;  Laterality: Right;  Right Groin Exploration   INGUINAL HERNIA REPAIR  2012   right   INGUINAL HERNIA REPAIR  11/19/2011   Procedure: HERNIA REPAIR INGUINAL ADULT;  Surgeon: Harl Bowie, MD;  Location: [facility];  Service: General;  Laterality: Right;  excision of right groin mass    Allergies: No Known Allergies  Outpatient Meds: Current Outpatient Medications  Medication Sig Dispense Refill   Accu-Chek FastClix Lancets MISC USE TO TEST BLOOD SUGAR UP TO 4 TIMES A DAY 200 each 3   ACCU-CHEK GUIDE test strip USE TO TEST BLOOD SUGAR UP TO 4 TIMES A DAY 200 each 5   blood glucose meter kit and supplies KIT Dispense based on patient and insurance preference. Use up to four times daily as directed. (FOR ICD-9 250.00, 250.01). 1 each 0   lisinopril (ZESTRIL) 20 MG tablet Take 1 tablet (20 mg total) by mouth daily. 90 tablet 3   metFORMIN (GLUCOPHAGE) 500 MG tablet Take 1 tablet (500 mg total) by mouth 2 (two) times daily with a meal. 180 tablet 3   omeprazole (PRILOSEC) 20 MG capsule TAKE 1 CAPSULE(20 MG) BY MOUTH DAILY 90 capsule 0   rosuvastatin (CRESTOR) 10 MG tablet Take 1 tablet (10 mg total) by mouth daily. 90 tablet 3   clotrimazole-betamethasone (LOTRISONE) cream Apply 1 application topically 2 (two) times daily. 45  g 2   diclofenac (VOLTAREN) 75 MG EC tablet Take 1 tablet (75 mg total) by mouth 2 (two) times daily. 50 tablet 2   fluticasone (FLONASE) 50 MCG/ACT nasal spray USE ONE SPRAY IN EACH NARE AS NEEDED (Patient not taking: No sig reported) 48 g 1   loratadine (CLARITIN) 10 MG tablet Take 1 tablet (10 mg total) by mouth daily. (Patient not taking: No sig reported) 90 tablet 1   Current Facility-Administered Medications  Medication Dose Route Frequency Provider Last Rate Last Admin   0.9 %  sodium chloride infusion  500 mL Intravenous Once Doran Stabler, MD          ___________________________________________________________________ Objective   Exam:  BP (!) 156/89   Pulse 73   Temp 98.4 F (36.9 C)   Ht '5\' 11"'  (1.803 m)   Wt 242 lb (109.8 kg)   SpO2 96%   BMI 33.75 kg/m   CV: RRR without murmur, S1/S2 Resp: clear to auscultation bilaterally, normal RR and effort noted GI: soft, no tenderness, with active bowel sounds.   Assessment: Encounter Diagnosis  Name Primary?   Special screening for malignant neoplasms, colon Yes     Plan: Colonoscopy    The patient is appropriate for an endoscopic procedure in the ambulatory setting.   - Wilfrid Lund, MD

## 2021-05-05 NOTE — Op Note (Signed)
[facility] Patient Name: Dominic [name] Procedure Date: 05/05/2021 1:25 PM MRN: [id number] Endoscopist: Mallie Mussel L. Loletha Carrow , MD Age: 45 Referring MD:  Date of Birth: 12/04/1975 Gender: Male Account #: [id number] Procedure:                Colonoscopy Indications:              Screening for colorectal malignant neoplasm, This                            is the patient's first colonoscopy Medicines:                Monitored Anesthesia Care Procedure:                Pre-Anesthesia Assessment:                           - Prior to the procedure, a History and Physical                            was performed, and patient medications and                            allergies were reviewed. The patient's tolerance of                            previous anesthesia was also reviewed. The risks                            and benefits of the procedure and the sedation                            options and risks were discussed with the patient.                            All questions were answered, and informed consent                            was obtained. Prior Anticoagulants: The patient has                            taken no previous anticoagulant or antiplatelet                            agents. ASA Grade Assessment: III - A patient with                            severe systemic disease. After reviewing the risks                            and benefits, the patient was deemed in                            satisfactory condition to undergo the procedure.  After obtaining informed consent, the colonoscope                            was passed under direct vision. Throughout the                            procedure, the patient's blood pressure, pulse, and                            oxygen saturations were monitored continuously. The                            Olympus CF-HQ190L [id number]) Colonoscope was                            introduced through the anus  and advanced to the the                            terminal ileum, with identification of the                            appendiceal orifice and IC valve. The colonoscopy                            was performed without difficulty. The patient                            tolerated the procedure well. The quality of the                            bowel preparation was excellent. The terminal                            ileum, ileocecal valve, appendiceal orifice, and                            rectum were photographed. Scope In: 1:43:15 PM Scope Out: 1:53:49 PM Scope Withdrawal Time: 0 hours 8 minutes 57 seconds  Total Procedure Duration: 0 hours 10 minutes 34 seconds  Findings:                 The perianal and digital rectal examinations were                            normal.                           The terminal ileum appeared normal.                           There is no endoscopic evidence of polyps in the                            entire colon.  Internal hemorrhoids were found.                           The exam was otherwise without abnormality on                            direct and retroflexion views. Complications:            No immediate complications. Estimated Blood Loss:     Estimated blood loss: none. Impression:               - The examined portion of the ileum was normal.                           - Internal hemorrhoids.                           - The examination was otherwise normal on direct                            and retroflexion views.                           - No specimens collected. Recommendation:           - Patient has a contact number available for                            emergencies. The signs and symptoms of potential                            delayed complications were discussed with the                            patient. Return to normal activities tomorrow.                            Written discharge instructions  were provided to the                            patient.                           - Resume previous diet.                           - Continue present medications.                           - Repeat colonoscopy in 10 years for screening                            purposes. Zebedee Segundo L. Loletha Carrow, MD 05/05/2021 1:56:07 PM This report has been signed electronically.

## 2021-05-05 NOTE — Patient Instructions (Signed)
Please read handouts provided. Continue present medications. Repeat colonoscopy in 10 years for screening.   YOU HAD AN ENDOSCOPIC PROCEDURE TODAY AT [facility]:   Refer to the procedure report that was given to you for any specific questions about what was found during the examination.  If the procedure report does not answer your questions, please call your gastroenterologist to clarify.  If you requested that your care partner not be given the details of your procedure findings, then the procedure report has been included in a sealed envelope for you to review at your convenience later.  YOU SHOULD EXPECT: Some feelings of bloating in the abdomen. Passage of more gas than usual.  Walking can help get rid of the air that was put into your GI tract during the procedure and reduce the bloating. If you had a lower endoscopy (such as a colonoscopy or flexible sigmoidoscopy) you may notice spotting of blood in your stool or on the toilet paper. If you underwent a bowel prep for your procedure, you may not have a normal bowel movement for a few days.  Please Note:  You might notice some irritation and congestion in your nose or some drainage.  This is from the oxygen used during your procedure.  There is no need for concern and it should clear up in a day or so.  SYMPTOMS TO REPORT IMMEDIATELY:  Following lower endoscopy (colonoscopy or flexible sigmoidoscopy):  Excessive amounts of blood in the stool  Significant tenderness or worsening of abdominal pains  Swelling of the abdomen that is new, acute  Fever of 100F or higher    For urgent or emergent issues, a gastroenterologist can be reached at any hour by calling [phone]. Do not use MyChart messaging for urgent concerns.    DIET:  We do recommend a small meal at first, but then you may proceed to your regular diet.  Drink plenty of fluids but you should avoid alcoholic beverages for 24 hours.  ACTIVITY:  You should  plan to take it easy for the rest of today and you should NOT DRIVE or use heavy machinery until tomorrow (because of the sedation medicines used during the test).    FOLLOW UP: Our staff will call the number listed on your records 48-72 hours following your procedure to check on you and address any questions or concerns that you may have regarding the information given to you following your procedure. If we do not reach you, we will leave a message.  We will attempt to reach you two times.  During this call, we will ask if you have developed any symptoms of COVID 19. If you develop any symptoms (ie: fever, flu-like symptoms, shortness of breath, cough etc.) before then, please call [phone].  If you test positive for Covid 19 in the 2 weeks post procedure, please call and report this information to [name].    If any biopsies were taken you will be contacted by phone or by letter within the next 1-3 weeks.  Please call us at [phone] if you have not heard about the biopsies in 3 weeks.    SIGNATURES/CONFIDENTIALITY: You and/or your care partner have signed paperwork which will be entered into your electronic medical record.  These signatures attest to the fact that that the information above on your After Visit Summary has been reviewed and is understood.  Full responsibility of the confidentiality of this discharge information lies with you and/or your care-partner.

## 2021-05-05 NOTE — Progress Notes (Signed)
To pacu, VSS. Report to [email or web address] 

## 2021-05-07 ENCOUNTER — Telehealth: Payer: Self-pay | Admitting: *Deleted

## 2021-05-07 NOTE — Telephone Encounter (Signed)
  Follow up Call-  Call back number 05/05/2021  Post procedure Call Back phone  # [phone]  Permission to leave phone message Yes  Some recent data might be hidden     Patient questions:  Do you have a fever, pain , or abdominal swelling? No. Pain Score  0 *  Have you tolerated food without any problems? Yes.    Have you been able to return to your normal activities? Yes.    Do you have any questions about your discharge instructions: Diet   No. Medications  No. Follow up visit  No.  Do you have questions or concerns about your Care? No.  Actions: * If pain score is 4 or above: No action needed, pain <4.Have you developed a fever since your procedure? no  2.   Have you had an respiratory symptoms (SOB or cough) since your procedure? no  3.   Have you tested positive for COVID 19 since your procedure no  4.   Have you had any family members/close contacts diagnosed with the COVID 19 since your procedure?  no   If yes to any of these questions please route to Joylene John, RN and Joella Prince, RN

## 2021-05-22 ENCOUNTER — Other Ambulatory Visit: Payer: Self-pay | Admitting: Emergency Medicine

## 2021-05-22 DIAGNOSIS — K219 Gastro-esophageal reflux disease without esophagitis: Secondary | ICD-10-CM

## 2021-08-20 ENCOUNTER — Other Ambulatory Visit: Payer: Self-pay | Admitting: Emergency Medicine

## 2021-08-20 DIAGNOSIS — I152 Hypertension secondary to endocrine disorders: Secondary | ICD-10-CM

## 2021-08-20 DIAGNOSIS — E1159 Type 2 diabetes mellitus with other circulatory complications: Secondary | ICD-10-CM

## 2021-11-18 ENCOUNTER — Other Ambulatory Visit: Payer: Self-pay | Admitting: Emergency Medicine

## 2021-11-18 DIAGNOSIS — E1165 Type 2 diabetes mellitus with hyperglycemia: Secondary | ICD-10-CM

## 2022-01-27 ENCOUNTER — Ambulatory Visit: Payer: BC Managed Care – PPO | Admitting: Emergency Medicine

## 2022-01-27 ENCOUNTER — Encounter: Payer: Self-pay | Admitting: Emergency Medicine

## 2022-01-27 VITALS — BP 120/78 | HR 54 | Temp 98.2°F | Ht 71.0 in | Wt 251.4 lb

## 2022-01-27 DIAGNOSIS — E1165 Type 2 diabetes mellitus with hyperglycemia: Secondary | ICD-10-CM | POA: Diagnosis not present

## 2022-01-27 DIAGNOSIS — E1169 Type 2 diabetes mellitus with other specified complication: Secondary | ICD-10-CM

## 2022-01-27 DIAGNOSIS — E785 Hyperlipidemia, unspecified: Secondary | ICD-10-CM | POA: Diagnosis not present

## 2022-01-27 DIAGNOSIS — E1159 Type 2 diabetes mellitus with other circulatory complications: Secondary | ICD-10-CM

## 2022-01-27 DIAGNOSIS — I10 Essential (primary) hypertension: Secondary | ICD-10-CM | POA: Diagnosis not present

## 2022-01-27 DIAGNOSIS — Z9989 Dependence on other enabling machines and devices: Secondary | ICD-10-CM

## 2022-01-27 DIAGNOSIS — G4733 Obstructive sleep apnea (adult) (pediatric): Secondary | ICD-10-CM | POA: Diagnosis not present

## 2022-01-27 LAB — LIPID PANEL
Cholesterol: 166 mg/dL (ref 0–200)
HDL: 35.9 mg/dL — ABNORMAL LOW (ref >39.00)
LDL Cholesterol: 97 mg/dL (ref 0–99)
NonHDL: 130.58
Total CHOL/HDL Ratio: 5
Triglycerides: 170 mg/dL — ABNORMAL HIGH (ref 0.0–149.0)
VLDL: 34 mg/dL (ref 0.0–40.0)

## 2022-01-27 LAB — COMPREHENSIVE METABOLIC PANEL
ALT: 34 U/L (ref 0–53)
AST: 27 U/L (ref 0–37)
Albumin: 4.4 g/dL (ref 3.5–5.2)
Alkaline Phosphatase: 65 U/L (ref 39–117)
BUN: 14 mg/dL (ref 6–23)
CO2: 25 mEq/L (ref 19–32)
Calcium: 9.3 mg/dL (ref 8.4–10.5)
Chloride: 106 mEq/L (ref 96–112)
Creatinine, Ser: 0.64 mg/dL (ref 0.40–1.50)
GFR: 112.34 mL/min (ref >60.00)
Glucose, Bld: 119 mg/dL — ABNORMAL HIGH (ref 70–99)
Potassium: 3.7 mEq/L (ref 3.5–5.1)
Sodium: 140 mEq/L (ref 135–145)
Total Bilirubin: 1.1 mg/dL (ref 0.2–1.2)
Total Protein: 7.4 g/dL (ref 6.0–8.3)

## 2022-01-27 LAB — POCT GLYCOSYLATED HEMOGLOBIN (HGB A1C): Hemoglobin A1C: 6.1 % — AB (ref 4.0–5.6)

## 2022-01-27 LAB — MICROALBUMIN / CREATININE URINE RATIO
Creatinine,U: 157.9 mg/dL
Microalb Creat Ratio: 0.9 mg/g (ref 0.0–30.0)
Microalb, Ur: 1.4 mg/dL (ref 0.0–1.9)

## 2022-01-27 MED ORDER — METFORMIN HCL 500 MG PO TABS: 500.0000 mg | ORAL_TABLET | Freq: Two times a day (BID) | ORAL | 3 refills | Status: DC

## 2022-01-27 NOTE — Patient Instructions (Signed)
Diabetes mellitus y nutricin, en adultos Diabetes Mellitus and Nutrition, Adult Si sufre de diabetes, o diabetes mellitus, es muy importante tener hbitos alimenticios saludables debido a que sus niveles de azcar en la sangre (glucosa) se ven afectados en gran medida por lo que come y bebe. Comer alimentos saludables en las cantidades correctas, aproximadamente a la misma hora todos los das, lo ayudar a: Controlar su glucemia. Disminuir el riesgo de sufrir una enfermedad cardaca. Mejorar la presin arterial. Alcanzar o mantener un peso saludable. Qu puede afectar mi plan de alimentacin? Todas las personas que sufren de diabetes son diferentes y cada una tiene necesidades diferentes en cuanto a un plan de alimentacin. El mdico puede recomendarle que trabaje con un nutricionista para elaborar el mejor plan para usted. Su plan de alimentacin puede variar segn factores como: Las caloras que necesita. Los medicamentos que toma. Su peso. Sus niveles de glucemia, presin arterial y colesterol. Su nivel de actividad. Otras afecciones que tenga, como enfermedades cardacas o renales. Cmo me afectan los carbohidratos? Los carbohidratos, o hidratos de carbono, afectan su nivel de glucemia ms que cualquier otro tipo de alimento. La ingesta de carbohidratos aumenta la cantidad de glucosa en la sangre. Es importante conocer la cantidad de carbohidratos que se pueden ingerir en cada comida sin correr ningn riesgo. Esto es diferente en cada persona. Su nutricionista puede ayudarlo a calcular la cantidad de carbohidratos que debe ingerir en cada comida y en cada refrigerio. Cmo me afecta el alcohol? El alcohol puede provocar una disminucin de la glucemia (hipoglucemia), especialmente si usa insulina o toma determinados medicamentos por va oral para la diabetes. La hipoglucemia es una afeccin potencialmente mortal. Los sntomas de la hipoglucemia, como somnolencia, mareos y confusin, son  similares a los sntomas de haber consumido demasiado alcohol. No beba alcohol si: Su mdico le indica no hacerlo. Est embarazada, puede estar embarazada o est tratando de quedar embarazada. Si bebe alcohol: Limite la cantidad que bebe a lo siguiente: De 0 a 1 medida por da para las mujeres. De 0 a 2 medidas por da para los hombres. Sepa cunta cantidad de alcohol hay en las bebidas que toma. En los [place], una medida equivale a una botella de cerveza de 12 oz (355 ml), un vaso de vino de 5 oz (148 ml) o un vaso de una bebida alcohlica de alta graduacin de 1 oz (44 ml). Mantngase hidratado bebiendo agua, refrescos dietticos o t helado sin azcar. Tenga en cuenta que los refrescos comunes, los jugos y otras bebidas para mezclar pueden contener mucha azcar y se deben contar como carbohidratos. Consejos para seguir este plan  Leer las etiquetas de los alimentos Comience por leer el tamao de la porcin en la etiqueta de Informacin nutricional de los alimentos envasados y las bebidas. La cantidad de caloras, carbohidratos, grasas y otros nutrientes detallados en la etiqueta se basan en una porcin del alimento. Muchos alimentos contienen ms de una porcin por envase. Verifique la cantidad total de gramos (g) de carbohidratos totales en una porcin. Verifique la cantidad de gramos de grasas saturadas y grasas trans en una porcin. Escoja alimentos que no contengan estas grasas o que su contenido de estas sea bajo. Verifique la cantidad de miligramos (mg) de sal (sodio) en una porcin. La mayora de las personas deben limitar la ingesta de sodio total a menos de 2300 mg por da. Siempre consulte la informacin nutricional de los alimentos etiquetados como "con bajo contenido de grasa" o "sin grasa".   Estos alimentos pueden tener un mayor contenido de azcar agregada o carbohidratos refinados, y deben evitarse. Hable con su nutricionista para identificar sus objetivos diarios en  cuanto a los nutrientes mencionados en la etiqueta. Al ir de compras Evite comprar alimentos procesados, enlatados o precocidos. Estos alimentos tienden a tener una mayor cantidad de grasa, sodio y azcar agregada. Compre en la zona exterior de la tienda de comestibles. Esta es la zona donde se encuentran con mayor frecuencia las frutas y las verduras frescas, los cereales a granel, las carnes frescas y los productos lcteos frescos. Al cocinar Use mtodos de coccin a baja temperatura, como hornear, en lugar de mtodos de coccin a alta temperatura, como frer en abundante aceite. Cocine con aceites saludables, como el aceite de oliva, canola o girasol. Evite cocinar con manteca, crema o carnes con alto contenido de grasa. Planificacin de las comidas Coma las comidas y los refrigerios regularmente, preferentemente a la misma hora todos los das. Evite pasar largos perodos de tiempo sin comer. Consuma alimentos ricos en fibra, como frutas frescas, verduras, frijoles y cereales integrales. Consuma entre 4 y 6 onzas (entre 112 y 168 g) de protenas magras por da, como carnes magras, pollo, pescado, huevos o tofu. Una onza (oz) (28 g) de protena magra equivale a: 1 onza (28 g) de carne, pollo o pescado. 1 huevo.  taza (62 g) de tofu. Coma algunos alimentos por da que contengan grasas saludables, como aguacates, frutos secos, semillas y pescado. Qu alimentos debo comer? Frutas Bayas. Manzanas. Naranjas. Duraznos. Damascos. Ciruelas. Uvas. Mangos. Papayas. Granadas. Kiwi. Cerezas. Verduras Verduras de hoja verde, que incluyen lechuga, espinaca, col rizada, acelga, hojas de berza, hojas de mostaza y repollo. Remolachas. Coliflor. Brcoli. Zanahorias. Judas verdes. Tomates. Pimientos. Cebollas. Pepinos. Coles de Bruselas. Granos Granos integrales, como panes, galletas, tortillas, cereales y pastas de salvado o integrales. Avena sin azcar. Quinua. Arroz integral o salvaje. Carnes y otras  protenas Frutos de mar. Carne de ave sin piel. Cortes magros de ave y carne de res. Tofu. Frutos secos. Semillas. Lcteos Productos lcteos sin grasa o con bajo contenido de grasa, como leche, yogur y queso. Es posible que los productos detallados arriba no constituyan una lista completa de los alimentos y las bebidas que puede tomar. Consulte a un nutricionista para obtener ms informacin. Qu alimentos debo evitar? Frutas Frutas enlatadas al almbar. Verduras Verduras enlatadas. Verduras congeladas con mantequilla o salsa de crema. Granos Productos elaborados con harina y harina blanca refinada, como panes, pastas, bocadillos y cereales. Evite todos los alimentos procesados. Carnes y otras protenas Cortes de carne con alto contenido de grasa. Carne de ave con piel. Carnes empanizadas o fritas. Carne procesada. Evite las grasas saturadas. Lcteos Yogur, queso o leche enteros. Bebidas Bebidas azucaradas, como gaseosas o t helado. Es posible que los productos que se enumeran ms arriba no constituyan una lista completa de los alimentos y las bebidas que debe evitar. Consulte a un nutricionista para obtener ms informacin. Preguntas para hacerle al mdico Debo consultar con un especialista certificado en atencin y educacin sobre la diabetes? Es necesario que me rena con un nutricionista? A qu nmero puedo llamar si tengo preguntas? Cules son los mejores momentos para controlar la glucemia? Dnde encontrar ms informacin: American Diabetes Association (Asociacin Estadounidense de la Diabetes): diabetes.org Academy of Nutrition and Dietetics (Academia de Nutricin y Diettica): eatright.org National Institute of Diabetes and Digestive and Kidney Diseases (Instituto Nacional de la Diabetes y las Enfermedades Digestivas y Renales): niddk.nih.gov Association of Diabetes   Care & Education Specialists (Asociacin de Especialistas en Atencin y Educacin sobre la Diabetes):  diabeteseducator.org Resumen Es importante tener hbitos alimenticios saludables debido a que sus niveles de azcar en la sangre (glucosa) se ven afectados en gran medida por lo que come y bebe. Es importante consumir alcohol con prudencia. Un plan de comidas saludable lo ayudar a controlar la glucosa en sangre y a reducir el riesgo de enfermedades cardacas. El mdico puede recomendarle que trabaje con un nutricionista para elaborar el mejor plan para usted. Esta informacin no tiene como fin reemplazar el consejo del mdico. Asegrese de hacerle al mdico cualquier pregunta que tenga. Document Revised: 03/19/2020 Document Reviewed: 03/19/2020 Elsevier Patient Education  2023 Elsevier Inc.  

## 2022-01-27 NOTE — Assessment & Plan Note (Signed)
Well-controlled hypertension.  Continue lisinopril 20 mg daily. BP Readings from Last 3 Encounters:  01/27/22 120/78  05/05/21 120/75  04/07/21 138/80  Well-controlled diabetes with hemoglobin A1c of 6.1.  Continue metformin 500 mg twice a day. Cardiovascular risks associated with hypertension and diabetes discussed. Diet and nutrition discussed. Follow-up in 6 months.

## 2022-01-27 NOTE — Assessment & Plan Note (Signed)
Stable.  Needs follow-up with neurology sleep center.

## 2022-01-27 NOTE — Assessment & Plan Note (Signed)
Stable.  Diet and nutrition discussed.  Continue rosuvastatin 10 mg daily. The 10-year ASCVD risk score (Arnett DK, et al., 2019) is: 5.6%   Values used to calculate the score:     Age: 46 years     Sex: Male     Is Non-Hispanic African American: No     Diabetic: Yes     Tobacco smoker: No     Systolic Blood Pressure: 189 mmHg     Is BP treated: Yes     HDL Cholesterol: 41.4 mg/dL     Total Cholesterol: 173 mg/dL

## 2022-01-27 NOTE — Progress Notes (Signed)
Dominic [name] 46 y.o.   Chief Complaint  Patient presents with   Diabetes    Patient wants to try the Dexcom so he does not have to prick his fingers    Referral    Pt needs referral back to sleep specialist has not seen in over 5 years. Has CPAP at home     HISTORY OF PRESENT ILLNESS: This is a 46 y.o. male here for follow-up of diabetes, hypertension, and dyslipidemia. Doing well. Has history of OSA.  Needs referral for follow-up. No other complaints or medical concerns today.  Diabetes Pertinent negatives for hypoglycemia include no dizziness or headaches. Pertinent negatives for diabetes include no chest pain.     Prior to Admission medications   Medication Sig Start Date End Date Taking? Authorizing Provider  Accu-Chek FastClix Lancets MISC USE TO TEST BLOOD SUGAR UP TO 4 TIMES A DAY 04/07/21  Yes Annel Zunker, Ines Bloomer, MD  ACCU-CHEK GUIDE test strip USE TO TEST BLOOD SUGAR UP TO 4 TIMES A DAY 04/07/21  Yes Adalene Gulotta, Ines Bloomer, MD  blood glucose meter kit and supplies KIT Dispense based on patient and insurance preference. Use up to four times daily as directed. (FOR ICD-9 250.00, 250.01). 07/11/18  Yes Kriston Mckinnie, Ines Bloomer, MD  lisinopril (ZESTRIL) 20 MG tablet TAKE 1 TABLET(20 MG) BY MOUTH DAILY 08/20/21  Yes Airen Dales, Ines Bloomer, MD  omeprazole (PRILOSEC) 20 MG capsule TAKE 1 CAPSULE(20 MG) BY MOUTH DAILY 05/22/21  Yes Marcelino Campos, Ines Bloomer, MD  rosuvastatin (CRESTOR) 10 MG tablet TAKE 1 TABLET(10 MG) BY MOUTH DAILY 11/18/21  Yes Raneisha Bress, Ines Bloomer, MD  clotrimazole-betamethasone (LOTRISONE) cream Apply 1 application topically 2 (two) times daily. Patient not taking: Reported on 01/27/2022 04/07/21   Horald Pollen, MD  diclofenac (VOLTAREN) 75 MG EC tablet Take 1 tablet (75 mg total) by mouth 2 (two) times daily. Patient not taking: Reported on 01/27/2022 01/22/21   Wallene Huh, DPM  fluticasone [facility]) 50 MCG/ACT nasal spray USE ONE SPRAY IN [facility] NARE AS  NEEDED Patient not taking: Reported on 04/17/2021 03/23/21   Horald Pollen, MD  loratadine (CLARITIN) 10 MG tablet Take 1 tablet (10 mg total) by mouth daily. Patient not taking: Reported on 04/17/2021 10/13/20   Horald Pollen, MD  metFORMIN (GLUCOPHAGE) 500 MG tablet Take 1 tablet (500 mg total) by mouth 2 (two) times daily with a meal. 04/07/21 07/06/21  Horald Pollen, MD    No Known Allergies  Patient Active Problem List   Diagnosis Date Noted   Dyslipidemia associated with type 2 diabetes mellitus ([place]) 04/21/2018   OSA on CPAP 01/15/2016   Hypertension associated with diabetes ([place]) 09/22/2015   Obesity 05/31/2014    Past Medical History:  Diagnosis Date   Allergy    Diabetes mellitus without complication ([place])    GERD (gastroesophageal reflux disease)    Hernia    Hyperlipidemia    Hypertension     Past Surgical History:  Procedure Laterality Date   GROIN DISSECTION  11/19/2011   Procedure: Virl Son EXPLORATION;  Surgeon: Harl Bowie, MD;  Location: [facility];  Service: General;  Laterality: Right;  Right Groin Exploration   INGUINAL HERNIA REPAIR  2012   right   INGUINAL HERNIA REPAIR  11/19/2011   Procedure: HERNIA REPAIR INGUINAL ADULT;  Surgeon: Harl Bowie, MD;  Location: [facility];  Service: General;  Laterality: Right;  excision of right groin mass    Social History   Socioeconomic History  Marital status: Married    Spouse name: Not on file   Number of children: Not on file   Years of education: Not on file   Highest education level: Not on file  Occupational History    Comment: Demolition work  Tobacco Use   Smoking status: Former    Packs/day: 0.25    Years: 3.00    Total pack years: 0.75    Types: Cigarettes   Smokeless tobacco: Never  Vaping Use   Vaping Use: Never used  Substance and Sexual Activity   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      Children:  4 children; no grandchildren      Tobacco: none   Social Determinants of Radio broadcast assistant Strain: Not on file  Food Insecurity: Not on file  Transportation Needs: Not on file  Physical Activity: Not on file  Stress: Not on file  Social Connections: Not on file  Intimate Partner Violence: Not on file    Family History  Problem Relation Age of Onset   Hypertension Mother    Diabetes Mother    Diabetes Father    Colon cancer Neg Hx    Colon polyps Neg Hx    Esophageal cancer Neg Hx    Rectal cancer Neg Hx    Stomach cancer Neg Hx      Review of Systems  Constitutional: Negative.  Negative for chills and fever.  HENT: Negative.  Negative for congestion and sore throat.   Respiratory: Negative.  Negative for cough and shortness of breath.   Cardiovascular: Negative.  Negative for chest pain and palpitations.  Gastrointestinal: Negative.  Negative for abdominal pain, nausea and vomiting.  Genitourinary: Negative.   Skin: Negative.  Negative for rash.  Neurological: Negative.  Negative for dizziness and headaches.  All other systems reviewed and are negative.   Today's Vitals   01/27/22 0813  BP: 120/78  Pulse: (!) 54  Temp: 98.2 F (36.8 C)  TempSrc: Oral  SpO2: 93%  Weight: 251 lb 6 oz (114 kg)  Height: '5\' 11"'  (1.803 m)   Body mass index is 35.06 kg/m. Wt Readings from Last 3 Encounters:  01/27/22 251 lb 6 oz (114 kg)  05/05/21 242 lb (109.8 kg)  04/17/21 242 lb (109.8 kg)    Physical Exam Vitals reviewed.  Constitutional:      Appearance: Normal appearance.  HENT:     Head: Normocephalic.  Eyes:     Extraocular Movements: Extraocular movements intact.     Conjunctiva/sclera: Conjunctivae normal.     Pupils: Pupils are equal, round, and reactive to light.  Cardiovascular:     Rate and Rhythm: Normal rate and regular rhythm.      Pulses: Normal pulses.     Heart sounds: Normal heart sounds.  Pulmonary:     Effort: Pulmonary effort is normal.     Breath sounds: Normal breath sounds.  Abdominal:     Palpations: Abdomen is soft.     Tenderness: There is no abdominal tenderness.  Musculoskeletal:     Cervical back: No tenderness.     Right lower leg: No edema.     Left lower leg: No edema.  Lymphadenopathy:     Cervical: No cervical adenopathy.  Skin:    General: Skin is warm and dry.     Capillary Refill: Capillary refill takes less than 2 seconds.  Neurological:     General: No focal deficit present.     Mental Status: He is alert and oriented to person, place, and time.  Psychiatric:        Mood and Affect: Mood normal.        Behavior: Behavior normal.     ASSESSMENT & PLAN: A total of 48 minutes was spent with the patient and counseling/coordination of care regarding preparing for this visit, review of most recent office visit notes, review of most recent blood work results including today's hemoglobin A1c, review of multiple chronic medical problems and their management, review of all medications, education on nutrition, cardiovascular risks associated with hypertension, dyslipidemia, and diabetes, prognosis, documentation, need for follow-up.  Problem List Items Addressed This Visit       Cardiovascular and Mediastinum   Hypertension associated with diabetes ([place]) - Primary    Well-controlled hypertension.  Continue lisinopril 20 mg daily. BP Readings from Last 3 Encounters:  01/27/22 120/78  05/05/21 120/75  04/07/21 138/80  Well-controlled diabetes with hemoglobin A1c of 6.1.  Continue metformin 500 mg twice a day. Cardiovascular risks associated with hypertension and diabetes discussed. Diet and nutrition discussed. Follow-up in 6 months.       Relevant Medications   metFORMIN (GLUCOPHAGE) 500 MG tablet   Other Relevant Orders   POCT HgB A1C   Urine Microalbumin w/creat. ratio    Comprehensive metabolic panel     Respiratory   OSA on CPAP    Stable.  Needs follow-up with neurology sleep center.      Relevant Orders   Ambulatory referral to Neurology     Endocrine   Dyslipidemia associated with type 2 diabetes mellitus ([place])    Stable.  Diet and nutrition discussed.  Continue rosuvastatin 10 mg daily. The 10-year ASCVD risk score (Arnett DK, et al., 2019) is: 5.6%   Values used to calculate the score:     Age: 26 years     Sex: Male     Is Non-Hispanic African American: No     Diabetic: Yes     Tobacco smoker: No     Systolic Blood Pressure: 321 mmHg     Is BP treated: Yes     HDL Cholesterol: 41.4 mg/dL     Total Cholesterol: 173 mg/dL       Relevant Medications   metFORMIN (GLUCOPHAGE) 500 MG tablet   Other Relevant Orders   Lipid panel   Other Visit Diagnoses     Type 2 diabetes mellitus with hyperglycemia, without long-term current use of insulin (HCC)       Relevant Medications   metFORMIN (GLUCOPHAGE) 500 MG tablet      Patient Instructions  Diabetes mellitus y nutricin, en adultos Diabetes Mellitus and Nutrition, Adult Si sufre de diabetes, o diabetes mellitus, es muy importante tener hbitos alimenticios saludables debido a que sus niveles de Designer, television/film set sangre (glucosa) se ven afectados en gran medida por lo que come y bebe. Comer alimentos saludables en las cantidades correctas, aproximadamente a la misma hora todos los [place], [place] ayudar a: Chief Technology Officer su glucemia. Disminuir el riesgo de sufrir una enfermedad cardaca. Mejorar la presin arterial. Science writer o mantener un peso saludable. Qu puede afectar mi plan de alimentacin? Todas las personas que sufren de diabetes son diferentes y cada una tiene necesidades diferentes en cuanto a  un plan de alimentacin. El mdico puede recomendarle que trabaje con un nutricionista para elaborar el mejor plan para usted. Su plan de alimentacin puede variar segn factores como: Las caloras que  necesita. Los medicamentos que toma. Su peso. Sus niveles de glucemia, presin arterial y colesterol. Su nivel de [place]. Otras afecciones que tenga, como enfermedades cardacas o renales. Cmo me afectan los carbohidratos? Los carbohidratos, o hidratos de carbono, afectan su nivel de glucemia ms que cualquier otro tipo de alimento. La ingesta de carbohidratos aumenta la cantidad de [employer]. Es importante conocer la cantidad de carbohidratos que se pueden ingerir en cada comida sin correr Engineer, manufacturing. Esto es Psychologist, forensic. Su nutricionista puede ayudarlo a calcular la cantidad de carbohidratos que debe ingerir en cada comida y en cada refrigerio. Cmo me afecta el alcohol? El alcohol puede provocar una disminucin de la glucemia (hipoglucemia), especialmente si [place] insulina o toma determinados medicamentos por va oral para la diabetes. La hipoglucemia es una afeccin potencialmente mortal. Los sntomas de la hipoglucemia, como somnolencia, mareos y confusin, son similares a los sntomas de haber consumido demasiado alcohol. No beba alcohol si: Su mdico le indica no hacerlo. Est embarazada, puede estar embarazada o est tratando de [place]. Si bebe alcohol: Limite la cantidad que bebe a lo siguiente: De 0 a 1 medida por da para las mujeres. De 0 a 2 medidas por da para los hombres. Sepa cunta cantidad de alcohol hay en las bebidas que toma. En los [place], una medida equivale a una botella de cerveza de 12 oz (355 ml), un vaso de vino de 5 oz (148 ml) o un vaso de una bebida alcohlica de alta graduacin de 1 oz (44 ml). Mantngase hidratado bebiendo agua, refrescos dietticos o t helado sin azcar. Tenga en cuenta que los refrescos comunes, los jugos y otras bebidas para mezclar pueden contener Freight forwarder y se deben contar como carbohidratos. Consejos para seguir Company secretary las etiquetas de los alimentos Comience por leer el  tamao de la porcin en la etiqueta de Informacin nutricional de los alimentos envasados y las bebidas. La cantidad de caloras, carbohidratos, grasas y otros nutrientes detallados en la etiqueta se basan en una porcin del alimento. Muchos alimentos contienen ms de una porcin por envase. Verifique la cantidad total de gramos (g) de carbohidratos totales en una porcin. Verifique la cantidad de gramos de grasas saturadas y grasas trans en una porcin. Escoja alimentos que no contengan estas grasas o que su contenido de estas sea [place]. Verifique la cantidad de miligramos (mg) de sal (sodio) en una porcin. La State Farm de las personas deben limitar la ingesta de sodio total a menos de 2300 mg Honeywell. Siempre consulte la informacin nutricional de los alimentos etiquetados como "con bajo contenido de grasa" o "sin grasa". Estos alimentos pueden tener un mayor contenido de Location manager agregada o carbohidratos refinados, y deben evitarse. Hable con su nutricionista para identificar sus objetivos diarios en cuanto a los nutrientes mencionados en la etiqueta. Al ir de compras Evite comprar alimentos procesados, enlatados o precocidos. Estos alimentos tienden a Special educational needs teacher mayor cantidad de [place], sodio y azcar agregada. Compre en la zona exterior de la tienda de comestibles. Esta es la zona donde se encuentran con mayor frecuencia las frutas y las verduras frescas, los cereales a granel, las carnes frescas y los productos lcteos frescos. Al cocinar Use mtodos de coccin a baja temperatura, como hornear, en lugar de  mtodos de coccin a alta temperatura, como frer en abundante aceite. Cocine con aceites saludables, como el aceite de [place], canola o [place]. Evite cocinar con manteca, crema o carnes con alto contenido de grasa. Planificacin de las comidas Coma las comidas y los refrigerios regularmente, preferentemente a la misma hora todos [place]. Evite pasar largos perodos de tiempo sin comer. Consuma  alimentos ricos en fibra, como frutas frescas, verduras, frijoles y cereales integrales. Consuma entre 4 y 6 onzas (entre 112 y 168 g) de protenas magras por da, como carnes [place], pollo, pescado, huevos o tofu. Una onza (oz) (28 g) de protena magra equivale a: 1 onza (28 g) de carne, pollo o pescado. 1 huevo.  taza (62 g) de tofu. Coma algunos alimentos por da que contengan grasas saludables, como aguacates, frutos secos, semillas y pescado. Qu alimentos debo comer? Lambert Mody Bayas. Manzanas. Naranjas. Duraznos. Damascos. Ciruelas. Uvas. Mangos. Papayas. Granadas. Kiwi. Cerezas. Verduras Verduras de Boeing, que incluyen [place], [place], col rizada, acelga, hojas de berza, hojas de mostaza y repollo. Remolachas. Coliflor. Brcoli. Zanahorias. Judas verdes. Tomates. Pimientos. Cebollas. Pepinos. Coles de Bruselas. Granos Granos integrales, como panes, galletas, tortillas, cereales y pastas de salvado o integrales. Avena sin azcar. Quinua. Arroz integral o salvaje. Carnes y otras protenas Frutos de mar. Carne de ave sin piel. Cortes magros de ave y carne de res. Tofu. Frutos secos. Semillas. Lcteos Productos lcteos sin grasa o con bajo contenido de [place]abel, [place], yogur y [facility] [place]. Es posible que los productos detallados arriba no constituyan una lista completa de los alimentos y las bebidas que puede tomar. Consulte a un nutricionista para obtener ms informacin. Qu alimentos debo evitar? Lambert Mody Frutas enlatadas al almbar. Verduras Verduras enlatadas. Verduras congeladas con mantequilla o salsa de crema. Granos Productos elaborados con [place] y [place], como panes, pastas, bocadillos y cereales. Evite todos los alimentos procesados. Carnes y otras protenas Cortes de carne con alto contenido de Lobbyist. Carne de ave con piel. Carnes empanizadas o fritas. Carne procesada. Evite las grasas saturadas. Lcteos Yogur, [place] enteros. Bebidas Bebidas  azucaradas, como gaseosas o t helado. Es posible que los productos que se enumeran ms [place] no constituyan una lista completa de los alimentos y las bebidas que Nurse, adult. Consulte a un nutricionista para obtener ms informacin. Preguntas para hacerle al mdico Debo consultar con un especialista certificado en atencin y educacin sobre la diabetes? Es necesario que me rena con un nutricionista? A qu nmero puedo llamar si tengo preguntas? Cules son los mejores momentos para controlar la glucemia? Dnde encontrar ms informacin: American Diabetes Association (Asociacin Estadounidense de la Diabetes): diabetes.org Academy of Nutrition and Dietetics (Academia de Nutricin y Information systems manager): eatright.Unisys Corporation of Diabetes and Digestive and Kidney Diseases (Gregory la Diabetes y [place] y [place]ales): AmenCredit.is Association of Diabetes Care & Education Specialists (Asociacin de Especialistas en Atencin y Educacin sobre la Diabetes): diabeteseducator.org Resumen Es importante tener hbitos alimenticios saludables debido a que sus niveles de Designer, television/film set sangre (glucosa) se ven afectados en gran medida por lo que come y bebe. Es importante consumir alcohol con prudencia. Un plan de comidas saludable lo ayudar a controlar la glucosa en sangre y a reducir el riesgo de enfermedades cardacas. El mdico puede recomendarle que trabaje con un nutricionista para elaborar el mejor plan para usted. Esta informacin no tiene Marine scientist el consejo del mdico. Asegrese de hacerle al mdico cualquier pregunta que tenga. Document Revised: 03/19/2020  Document Reviewed: 03/19/2020 Elsevier Patient Education  [place], MD [facility]

## 2022-04-08 ENCOUNTER — Other Ambulatory Visit: Payer: Self-pay | Admitting: Emergency Medicine

## 2022-04-08 DIAGNOSIS — K219 Gastro-esophageal reflux disease without esophagitis: Secondary | ICD-10-CM

## 2022-05-19 ENCOUNTER — Other Ambulatory Visit: Payer: Self-pay | Admitting: Emergency Medicine

## 2022-07-22 ENCOUNTER — Encounter: Payer: Self-pay | Admitting: Emergency Medicine

## 2022-07-22 ENCOUNTER — Ambulatory Visit: Payer: BC Managed Care – PPO | Admitting: Emergency Medicine

## 2022-07-22 VITALS — BP 130/70 | HR 79 | Temp 98.4°F | Ht 71.0 in | Wt 256.6 lb

## 2022-07-22 DIAGNOSIS — E1169 Type 2 diabetes mellitus with other specified complication: Secondary | ICD-10-CM

## 2022-07-22 DIAGNOSIS — E1159 Type 2 diabetes mellitus with other circulatory complications: Secondary | ICD-10-CM | POA: Diagnosis not present

## 2022-07-22 DIAGNOSIS — E785 Hyperlipidemia, unspecified: Secondary | ICD-10-CM | POA: Diagnosis not present

## 2022-07-22 DIAGNOSIS — I152 Hypertension secondary to endocrine disorders: Secondary | ICD-10-CM

## 2022-07-22 DIAGNOSIS — Z1211 Encounter for screening for malignant neoplasm of colon: Secondary | ICD-10-CM

## 2022-07-22 DIAGNOSIS — G4733 Obstructive sleep apnea (adult) (pediatric): Secondary | ICD-10-CM

## 2022-07-22 LAB — COMPREHENSIVE METABOLIC PANEL
ALT: 42 U/L (ref 0–53)
AST: 22 U/L (ref 0–37)
Albumin: 4.4 g/dL (ref 3.5–5.2)
Alkaline Phosphatase: 68 U/L (ref 39–117)
BUN: 18 mg/dL (ref 6–23)
CO2: 28 mEq/L (ref 19–32)
Calcium: 9.6 mg/dL (ref 8.4–10.5)
Chloride: 105 mEq/L (ref 96–112)
Creatinine, Ser: 0.75 mg/dL (ref 0.40–1.50)
GFR: 106.73 mL/min (ref >60.00)
Glucose, Bld: 122 mg/dL — ABNORMAL HIGH (ref 70–99)
Potassium: 4 mEq/L (ref 3.5–5.1)
Sodium: 141 mEq/L (ref 135–145)
Total Bilirubin: 1.2 mg/dL (ref 0.2–1.2)
Total Protein: 8.1 g/dL (ref 6.0–8.3)

## 2022-07-22 LAB — POCT GLYCOSYLATED HEMOGLOBIN (HGB A1C): Hemoglobin A1C: 6.2 % — AB (ref 4.0–5.6)

## 2022-07-22 LAB — LIPID PANEL
Cholesterol: 172 mg/dL (ref 0–200)
HDL: 39.2 mg/dL (ref >39.00)
LDL Cholesterol: 107 mg/dL — ABNORMAL HIGH (ref 0–99)
NonHDL: 133.1
Total CHOL/HDL Ratio: 4
Triglycerides: 130 mg/dL (ref 0.0–149.0)
VLDL: 26 mg/dL (ref 0.0–40.0)

## 2022-07-22 NOTE — Assessment & Plan Note (Signed)
Stable.  Diet and nutrition discussed. Continue rosuvastatin 10 mg daily. The 10-year ASCVD risk score (Arnett DK, et al., 2019) is: 7%   Values used to calculate the score:     Age: 46 years     Sex: Male     Is Non-Hispanic African American: No     Diabetic: Yes     Tobacco smoker: No     Systolic Blood Pressure: 588 mmHg     Is BP treated: Yes     HDL Cholesterol: 35.9 mg/dL     Total Cholesterol: 166 mg/dL

## 2022-07-22 NOTE — Patient Instructions (Signed)
Mantenimiento de la salud en los hombres Health Maintenance, Male Adoptar un estilo de vida saludable y recibir atencin preventiva son importantes para promover la salud y el bienestar. Consulte al mdico sobre: El esquema adecuado para hacerse pruebas y exmenes peridicos. Cosas que puede hacer por su cuenta para prevenir enfermedades y mantenerse sano. Qu debo saber sobre la dieta, el peso y el ejercicio? Consuma una dieta saludable  Consuma una dieta que incluya muchas verduras, frutas, productos lcteos con bajo contenido de grasa y protenas magras. No consuma muchos alimentos ricos en grasas slidas, azcares agregados o sodio. Mantenga un peso saludable El ndice de masa muscular (IMC) es una medida que puede utilizarse para identificar posibles problemas de peso. Proporciona una estimacin de la grasa corporal basndose en el peso y la altura. Su mdico puede ayudarle a determinar su IMC y a lograr o mantener un peso saludable. Haga ejercicio con regularidad Haga ejercicio con regularidad. Esta es una de las prcticas ms importantes que puede hacer por su salud. La mayora de los adultos deben seguir estas pautas: Realizar, al menos, 150 minutos de actividad fsica por semana. El ejercicio debe aumentar la frecuencia cardaca y hacerlo transpirar (ejercicio de intensidad moderada). Hacer ejercicios de fortalecimiento por lo menos dos veces por semana. Agregue esto a su plan de ejercicio de intensidad moderada. Pase menos tiempo sentado. Incluso la actividad fsica ligera puede ser beneficiosa. Controle sus niveles de colesterol y lpidos en la sangre Comience a realizarse anlisis de lpidos y colesterol en la sangre a los 20 aos y luego reptalos cada 5 aos. Es posible que necesite controlar los niveles de colesterol con mayor frecuencia si: Sus niveles de lpidos y colesterol son altos. Es mayor de 40 aos. Presenta un alto riesgo de padecer enfermedades cardacas. Qu debo  saber sobre las pruebas de deteccin del cncer? Muchos tipos de cncer pueden detectarse de manera temprana y, a menudo, pueden prevenirse. Segn su historia clnica y sus antecedentes familiares, es posible que deba realizarse pruebas de deteccin del cncer en diferentes edades. Esto puede incluir pruebas de deteccin de lo siguiente: Cncer colorrectal. Cncer de prstata. Cncer de piel. Cncer de pulmn. Qu debo saber sobre la enfermedad cardaca, la diabetes y la hipertensin arterial? Presin arterial y enfermedad cardaca La hipertensin arterial causa enfermedades cardacas y aumenta el riesgo de accidente cerebrovascular. Es ms probable que esto se manifieste en las personas que tienen lecturas de presin arterial alta o tienen sobrepeso. Hable con el mdico sobre sus valores de presin arterial deseados. Hgase controlar la presin arterial: Cada 3 a 5 aos si tiene entre 18 y 39 aos. Todos los aos si es mayor de 40 aos. Si tiene entre 65 y 75 aos y es fumador o sola fumar, pregntele al mdico si debe realizarse una prueba de deteccin de aneurisma artico abdominal (AAA) por nica vez. Diabetes Realcese exmenes de deteccin de la diabetes con regularidad. Este anlisis revisa el nivel de azcar en la sangre en ayunas. Hgase las pruebas de deteccin: Cada tres aos despus de los 45 aos de edad si tiene un peso normal y un bajo riesgo de padecer diabetes. Con ms frecuencia y a partir de una edad inferior si tiene sobrepeso o un alto riesgo de padecer diabetes. Qu debo saber sobre la prevencin de infecciones? Hepatitis B Si tiene un riesgo ms alto de contraer hepatitis B, debe someterse a un examen de deteccin de este virus. Hable con el mdico para averiguar si tiene riesgo de   contraer la infeccin por hepatitis B. Hepatitis C Se recomienda un anlisis de sangre para: Todos los que nacieron entre 1945 y 1965. Todas las personas que tengan un riesgo de haber  contrado hepatitis C. Enfermedades de transmisin sexual (ETS) Debe realizarse pruebas de deteccin de ITS todos los aos, incluidas la gonorrea y la clamidia, si: Es sexualmente activo y es menor de 24 aos. Es mayor de 24 aos, y el mdico le informa que corre riesgo de tener este tipo de infecciones. La actividad sexual ha cambiado desde que le hicieron la ltima prueba de deteccin y tiene un riesgo mayor de tener clamidia o gonorrea. Pregntele al mdico si usted tiene riesgo. Pregntele al mdico si usted tiene un alto riesgo de contraer VIH. El mdico tambin puede recomendarle un medicamento recetado para ayudar a evitar la infeccin por el VIH. Si elige tomar medicamentos para prevenir el VIH, primero debe hacerse los anlisis de deteccin del VIH. Luego debe hacerse anlisis cada 3 meses mientras est tomando los medicamentos. Siga estas indicaciones en su casa: Consumo de alcohol No beba alcohol si el mdico se lo prohbe. Si bebe alcohol: Limite la cantidad que consume de 0 a 2 bebidas por da. Sepa cunta cantidad de alcohol hay en las bebidas que toma. En los [place], una medida equivale a una botella de cerveza de 12 oz (355 ml), un vaso de vino de 5 oz (148 ml) o un vaso de una bebida alcohlica de alta graduacin de 1 oz (44 ml). Estilo de vida No consuma ningn producto que contenga nicotina o tabaco. Estos productos incluyen cigarrillos, tabaco para mascar y aparatos de vapeo, como los cigarrillos electrnicos. Si necesita ayuda para dejar de consumir estos productos, consulte al mdico. No consuma drogas. No comparta agujas. Solicite ayuda a su mdico si necesita apoyo o informacin para abandonar las drogas. Indicaciones generales Realcese los estudios de rutina de la salud, dentales y de la vista. Mantngase al da con las vacunas. Infrmele a su mdico si: Se siente deprimido con frecuencia. Alguna vez ha sido vctima de maltrato o no se siente seguro en su  casa. Resumen Adoptar un estilo de vida saludable y recibir atencin preventiva son importantes para promover la salud y el bienestar. Siga las instrucciones del mdico acerca de una dieta saludable, el ejercicio y la realizacin de pruebas o exmenes para detectar enfermedades. Siga las instrucciones del mdico con respecto al control del colesterol y la presin arterial. Esta informacin no tiene como fin reemplazar el consejo del mdico. Asegrese de hacerle al mdico cualquier pregunta que tenga. Document Revised: 12/17/2020 Document Reviewed: 12/17/2020 Elsevier Patient Education  2023 Elsevier Inc.  

## 2022-07-22 NOTE — Assessment & Plan Note (Signed)
Stable.  On CPAP treatment. 

## 2022-07-22 NOTE — Progress Notes (Signed)
Dominic [name] 46 y.o.   Chief Complaint  Patient presents with   Follow-up    6 month f/u    HISTORY OF PRESENT ILLNESS: This is a 46 y.o. male A1A here for 85-monthfollow-up of hypertension, diabetes and dyslipidemia. Overall doing well.  Has no complaints or medical concerns today.  HPI   Prior to Admission medications   Medication Sig Start Date End Date Taking? Authorizing Provider  Accu-Chek FastClix Lancets MISC USE TO TEST BLOOD SUGAR UP TO 4 TIMES A DAY 04/07/21  Yes Deontae Robson, MInes Bloomer MD  ACCU-CHEK GUIDE test strip USE TO TEST BLOOD SUGAR UP TO 4 TIMES A DAY 04/07/21  Yes Laetitia Schnepf, MInes Bloomer MD  blood glucose meter kit and supplies KIT Dispense based on patient and insurance preference. Use up to four times daily as directed. (FOR ICD-9 250.00, 250.01). 07/11/18  Yes Sohil Timko, MInes Bloomer MD  clotrimazole-betamethasone (LOTRISONE) cream APPLY TOPICALLY TO THE AFFECTED AREA TWICE DAILY 05/20/22  Yes Nayab Aten, MInes Bloomer MD  diclofenac (VOLTAREN) 75 MG EC tablet Take 1 tablet (75 mg total) by mouth 2 (two) times daily. 01/22/21  Yes Regal, NTamala Fothergill DPM  lisinopril (ZESTRIL) 20 MG tablet TAKE 1 TABLET(20 MG) BY MOUTH DAILY 08/20/21  Yes Courtenay Hirth, MInes Bloomer MD  loratadine (CLARITIN) 10 MG tablet Take 1 tablet (10 mg total) by mouth daily. 10/13/20  Yes SHorald Pollen MD  metFORMIN (GLUCOPHAGE) 500 MG tablet Take 1 tablet (500 mg total) by mouth 2 (two) times daily with a meal. 01/27/22 01/22/23 Yes [place] M[place] Bloom[place] MD  omeprazole (PRILOSEC) 20 MG capsule TAKE 1 CAPSULE(20 MG) BY MOUTH DAILY 04/08/22  Yes Shi Grose, MInes Bloomer MD  rosuvastatin (CRESTOR) 10 MG tablet TAKE 1 TABLET(10 MG) BY MOUTH DAILY 11/18/21  Yes Hipolito Martinezlopez, MInes Bloomer MD  fluticasone ([facility] 50 MCG/ACT nasal spray USE [facility] SPRAY [facility] AS NEEDED Patient not taking: Reported on 04/17/2021 03/23/21   SHorald Pollen MD    No Known Allergies  Patient Active Problem List    Diagnosis Date Noted   Dyslipidemia associated with type 2 diabetes mellitus ([place] 04/21/2018   OSA on CPAP 01/15/2016   Hypertension associated with diabetes ([place] 09/22/2015   Obesity 05/31/2014    Past Medical History:  Diagnosis Date   Allergy    Diabetes mellitus without complication ([place]    GERD (gastroesophageal reflux disease)    Hernia    Hyperlipidemia    Hypertension     Past Surgical History:  Procedure Laterality Date   GROIN DISSECTION  11/19/2011   Procedure: GVirl SonEXPLORATION;  Surgeon: DHarl Bowie MD;  Location: [facility];  Service: General;  Laterality: Right;  Right Groin Exploration   INGUINAL HERNIA REPAIR  2012   right   INGUINAL HERNIA REPAIR  11/19/2011   Procedure: HERNIA REPAIR INGUINAL ADULT;  Surgeon: DHarl Bowie MD;  Location: [facility];  Service: General;  Laterality: Right;  excision of right groin mass    Social History   Socioeconomic History   Marital status: Married    Spouse name: Not on file   Number of children: Not on file   Years of education: Not on file   Highest education level: Not on file  Occupational History    Comment: Demolition work  Tobacco Use   Smoking status: Former    Packs/day: 0.25    Years: 3.00    Total pack years: 0.75    Types: Cigarettes   Smokeless tobacco:  Never  Vaping Use   Vaping Use: Never used  Substance and Sexual Activity   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      Children:  4 children; no grandchildren      Tobacco: none   Social Determinants of Radio broadcast assistant Strain: Not on file  Food Insecurity: Not on file  Transportation Needs: Not on file  Physical Activity: Not on file  Stress: Not on file  Social Connections: Not on file  Intimate Partner Violence: Not on file    Family  History  Problem Relation Age of Onset   Hypertension Mother    Diabetes Mother    Diabetes Father    Colon cancer Neg Hx    Colon polyps Neg Hx    Esophageal cancer Neg Hx    Rectal cancer Neg Hx    Stomach cancer Neg Hx      Review of Systems  Constitutional: Negative.  Negative for chills and fever.  HENT: Negative.  Negative for congestion and sore throat.   Respiratory: Negative.  Negative for cough and shortness of breath.   Cardiovascular: Negative.  Negative for chest pain and palpitations.  Gastrointestinal: Negative.  Negative for abdominal pain, nausea and vomiting.  Genitourinary: Negative.  Negative for dysuria and hematuria.  Skin: Negative.  Negative for rash.  Neurological: Negative.  Negative for dizziness and headaches.  All other systems reviewed and are negative.  Today's Vitals   07/22/22 0809 07/22/22 0831  BP: (!) 130/90 130/70  Pulse: 79   Temp: 98.4 F (36.9 C)   TempSrc: Oral   SpO2: 95%   Weight: 256 lb 9.6 oz (116.4 kg)   Height: _0  (1.803 m)    Body mass index is 35.79 kg/m.   Physical Exam Constitutional:      Appearance: Normal appearance.  HENT:     Head: Normocephalic.     Mouth/Throat:     Mouth: Mucous membranes are moist.     Pharynx: Oropharynx is clear.  Eyes:     Extraocular Movements: Extraocular movements intact.     Pupils: Pupils are equal, round, and reactive to light.  Cardiovascular:     Rate and Rhythm: Normal rate and regular rhythm.  Pulmonary:     Effort: Pulmonary effort is normal.     Breath sounds: Normal breath sounds.  Musculoskeletal:        General: Normal range of motion.     Cervical back: No tenderness.  Lymphadenopathy:     Cervical: No cervical adenopathy.  Skin:    General: Skin is warm and dry.  Neurological:     Mental Status: He is alert and oriented to person, place, and time.  Psychiatric:        Mood and Affect: Mood normal.        Behavior: Behavior normal.      ASSESSMENT  & PLAN: A total of 45 minutes was spent with the patient and counseling/coordination of care regarding preparing for this visit, review of most recent office visit notes, review of most recent blood work results including interpretation of today's hemoglobin A1c, review of multiple chronic medical conditions and their management, cardiovascular risks associated with hypertension and diabetes, review of all medications, education on  nutrition, prognosis, documentation, and need for follow-up.  Problem List Items Addressed This Visit       Cardiovascular and Mediastinum   Hypertension associated with diabetes ([place]) - Primary    Well-controlled hypertension. Continue lisinopril 20 mg daily. Well-controlled diabetes with hemoglobin A1c of 6.2 Continue metformin 500 mg twice a day Cardiovascular risks associated with hypertension and diabetes discussed Diet and nutrition discussed Follow-up in 6 months.      Relevant Orders   POCT glycosylated hemoglobin (Hb A1C)   Comprehensive metabolic panel     Respiratory   OSA on CPAP    Stable.  On CPAP treatment.        Endocrine   Dyslipidemia associated with type 2 diabetes mellitus ([place])    Stable.  Diet and nutrition discussed. Continue rosuvastatin 10 mg daily. The 10-year ASCVD risk score (Arnett DK, et al., 2019) is: 7%   Values used to calculate the score:     Age: 53 years     Sex: Male     Is Non-Hispanic African American: No     Diabetic: Yes     Tobacco smoker: No     Systolic Blood Pressure: 798 mmHg     Is BP treated: Yes     HDL Cholesterol: 35.9 mg/dL     Total Cholesterol: 166 mg/dL       Relevant Orders   Lipid panel   Other Visit Diagnoses     Colon cancer screening       Relevant Orders   Cologuard      Patient Instructions  Mantenimiento de la salud en los hombres Health Maintenance, Male Adoptar un estilo de vida saludable y recibir atencin preventiva son importantes para promover la salud y Manufacturing systems engineer. Consulte al mdico sobre: El esquema adecuado para hacerse pruebas y exmenes peridicos. Cosas que puede hacer por su cuenta para prevenir enfermedades y [place] sano. Qu debo saber sobre la dieta, el peso y el ejercicio? Consuma una dieta saludable  Consuma una dieta que incluya muchas verduras, frutas, productos lcteos con bajo contenido de [place] y Advertising account planner. No consuma muchos alimentos ricos en grasas slidas, azcares agregados o sodio. Mantenga un peso saludable El ndice de masa muscular [facility]) es una medida que puede utilizarse para identificar posibles problemas de [place]. Proporciona una estimacin de la grasa corporal basndose en el peso y la altura. Su mdico puede ayudarle a Radiation protection practitioner [place] y a Scientist, forensic o Theatre manager un peso saludable. Haga ejercicio con regularidad Haga ejercicio con regularidad. Esta es una de las prcticas ms importantes que puede hacer por su salud. La [employer] de los adultos deben seguir estas pautas: Optometrist, al menos, 150 minutos de actividad fsica por semana. El ejercicio debe aumentar la frecuencia cardaca y Nature conservation officer transpirar (ejercicio de intensidad moderada). Hacer ejercicios de fortalecimiento por lo [employer] por semana. Agregue esto a su plan de ejercicio de intensidad moderada. Pase menos tiempo sentado. Incluso la actividad fsica ligera puede ser beneficiosa. Controle sus niveles de colesterol y lpidos en la sangre Comience a realizarse anlisis de lpidos y Research officer, trade union en la sangre a los 62 aos y luego reptalos cada 5 aos. Es posible que Automotive engineer los niveles de colesterol con mayor frecuencia si: Sus niveles de lpidos y colesterol son altos. Es mayor de 46 aos. Presenta un alto riesgo de padecer enfermedades cardacas. Qu debo saber sobre las pruebas de deteccin del cncer? Muchos tipos de cncer pueden detectarse de [place] y, a  menudo, pueden prevenirse. Segn su historia clnica y sus  antecedentes familiares, es posible que deba realizarse pruebas de deteccin del cncer en diferentes edades. Esto puede incluir pruebas de deteccin de lo siguiente: Surveyor, minerals. Cncer de prstata. Cncer de piel. Cncer de pulmn. Qu debo saber sobre la enfermedad cardaca, la diabetes y la hipertensin arterial? Presin arterial y enfermedad cardaca La hipertensin arterial causa enfermedades cardacas y [place] el riesgo de accidente cerebrovascular. Es ms probable que esto se manifieste en las personas que tienen lecturas de presin arterial alta o tienen sobrepeso. Hable con el mdico sobre sus valores de presin arterial deseados. Hgase controlar la presin arterial: Cada 3 a 5 aos si tiene entre 18 y 40 aos. Todos los aos si es mayor de 40 aos. Si tiene entre 32 y 53 aos y es fumador o Insurance account manager, pregntele al mdico si debe realizarse una prueba de deteccin de aneurisma artico abdominal (AAA) por nica vez. Diabetes Realcese exmenes de deteccin de la diabetes con regularidad. Este anlisis revisa el nivel de azcar en la sangre en [place]. Hgase las pruebas de deteccin: Cada tres aos despus de los 68 aos de edad si tiene un peso normal y un bajo riesgo de padecer diabetes. Con ms frecuencia y a partir de [place] edad inferior si tiene sobrepeso o un alto riesgo de padecer diabetes. Qu debo saber sobre la prevencin de infecciones? Hepatitis B Si tiene un riesgo ms alto de contraer hepatitis B, debe someterse a un examen de deteccin de este virus. Hable con el mdico para averiguar si tiene riesgo de contraer la infeccin por hepatitis B. Hepatitis C Se recomienda un anlisis de [place] para: Todos los que nacieron entre 1947 y [phone]. Todas las personas que tengan un riesgo de haber contrado hepatitis C. Enfermedades de transmisin sexual (ETS) Debe realizarse pruebas de deteccin de ITS todos los aos, incluidas la gonorrea y la clamidia, si: Es  sexualmente activo y es menor de 55 aos. Es mayor de 63 aos, y Investment banker, operational informa que corre riesgo de tener este tipo de infecciones. La actividad sexual ha cambiado desde que le hicieron la ltima prueba de deteccin y tiene un riesgo mayor de Best boy clamidia o Radio broadcast assistant. Pregntele al mdico si usted tiene riesgo. Pregntele al mdico si usted tiene un alto riesgo de Museum/gallery curator VIH. El mdico tambin puede recomendarle un medicamento recetado para ayudar a evitar la infeccin por el VIH. Si elige tomar medicamentos para prevenir el VIH, primero debe [employer] de deteccin del VIH. Luego debe hacerse anlisis cada 3 meses mientras est tomando los medicamentos. Siga estas indicaciones en su casa: Consumo de alcohol No beba alcohol si el mdico se lo prohbe. Si bebe alcohol: Limite la cantidad que consume de 0 a 2 bebidas por da. Sepa cunta cantidad de alcohol hay en las bebidas que toma. En los [place], una medida equivale a una botella de cerveza de 12 oz (355 ml), un vaso de vino de 5 oz (148 ml) o un vaso de una bebida alcohlica de alta graduacin de 1 oz (44 ml). Estilo de vida No consuma ningn producto que contenga nicotina o tabaco. Estos productos incluyen cigarrillos, tabaco para Higher education careers adviser y aparatos de vapeo, como los Psychologist, sport and exercise. Si necesita ayuda para dejar de consumir estos productos, consulte al mdico. No consuma drogas. No comparta agujas. Solicite ayuda a su mdico si necesita apoyo o informacin para abandonar las drogas. Indicaciones generales Realcese los estudios de [place] de  la salud, dentales y de Public librarian. [place]. Infrmele a su mdico si: Se siente deprimido con frecuencia. Alguna vez ha sido vctima de [place] o no se siente seguro en su casa. Resumen Adoptar un estilo de vida saludable y recibir atencin preventiva son importantes para promover la salud y Musician. Siga las instrucciones del mdico  acerca de una dieta saludable, el ejercicio y la realizacin de pruebas o exmenes para Engineer, building services. Siga las instrucciones del mdico con respecto al control del colesterol y la presin arterial. Esta informacin no tiene Marine scientist el consejo del mdico. Asegrese de hacerle al mdico cualquier pregunta que tenga. Document Revised: 12/17/2020 Document Reviewed: 12/17/2020 Elsevier Patient Education  [place], MD [facility]

## 2022-07-22 NOTE — Assessment & Plan Note (Signed)
Well-controlled hypertension. Continue lisinopril 20 mg daily. Well-controlled diabetes with hemoglobin A1c of 6.2 Continue metformin 500 mg twice a day Cardiovascular risks associated with hypertension and diabetes discussed Diet and nutrition discussed Follow-up in 6 months.

## 2022-07-30 ENCOUNTER — Other Ambulatory Visit: Payer: Self-pay | Admitting: Emergency Medicine

## 2022-07-30 DIAGNOSIS — K219 Gastro-esophageal reflux disease without esophagitis: Secondary | ICD-10-CM

## 2022-08-05 ENCOUNTER — Telehealth: Payer: Self-pay | Admitting: Emergency Medicine

## 2022-08-05 MED ORDER — DEXCOM G7 RECEIVER DEVI: 0 refills | Status: DC

## 2022-08-05 MED ORDER — DEXCOM G7 SENSOR MISC: 6 refills | Status: DC

## 2022-08-05 NOTE — Telephone Encounter (Signed)
Pt is requesting a new blood sugar monitor, one for his arm, not one that 'pokes my finger'.   If possible please send an RX/ Order for this type of monitor to: [id number]   Phone: [phone]  Fax: [phone]

## 2022-08-05 NOTE — Telephone Encounter (Signed)
New prescription sent to patient pharmacy  

## 2022-08-05 NOTE — Telephone Encounter (Signed)
It is okay.  Thanks.

## 2022-08-06 ENCOUNTER — Telehealth: Payer: Self-pay | Admitting: *Deleted

## 2022-08-06 NOTE — Telephone Encounter (Signed)
PA for Dexcom G7 sensor submitted, awaiting response Key:  [id number]

## 2022-08-06 NOTE — Telephone Encounter (Signed)
PA for [facility] G7 receiver submitted, awaiting response Key: [id number]

## 2022-08-09 NOTE — Telephone Encounter (Signed)
PA for Dexcom G7 receiver denied

## 2022-08-09 NOTE — Telephone Encounter (Signed)
PA for Dexcom G7 sensors denied. Patient will contact insurance company to see which CGM is covered.

## 2022-08-11 ENCOUNTER — Telehealth: Payer: Self-pay | Admitting: Emergency Medicine

## 2022-08-11 NOTE — Telephone Encounter (Signed)
Okay to send for freestyle libre monitor.  Thanks.

## 2022-08-11 NOTE — Telephone Encounter (Signed)
[employer] - Jenny Reichmann [phone]  Jenny Reichmann from [place] called and said that patient's dexcom rx was denied due to patient not being on insulin.  She states that he would quality for the free style libre monitor  Please fax this script to:  [phone] or go to cover my meds

## 2022-08-12 MED ORDER — FREESTYLE LIBRE READER DEVI: 0 refills | Status: AC

## 2022-08-12 NOTE — Telephone Encounter (Signed)
Rx sent to [facility].Marland KitchenJohny [name]

## 2022-09-13 ENCOUNTER — Institutional Professional Consult (permissible substitution): Payer: BC Managed Care – PPO | Admitting: Neurology

## 2022-09-23 ENCOUNTER — Other Ambulatory Visit: Payer: Self-pay | Admitting: Emergency Medicine

## 2022-09-23 DIAGNOSIS — J301 Allergic rhinitis due to pollen: Secondary | ICD-10-CM

## 2022-09-28 ENCOUNTER — Telehealth: Payer: Self-pay | Admitting: Emergency Medicine

## 2022-09-28 NOTE — Telephone Encounter (Signed)
Patient medication was sent to the requested pharmacy on 09/24/2022

## 2022-09-28 NOTE — Telephone Encounter (Signed)
Pt called wanted Rx refilled for:  fluticasone (FLONASE) 50 MCG/ACT nasal spray   loratadine (CLARITIN) 10 MG tablet   Preferred Pharmacy:  [facility] [place], [address]    LOV: 07/22/22

## 2022-10-07 ENCOUNTER — Ambulatory Visit: Payer: BC Managed Care – PPO | Admitting: Neurology

## 2022-10-07 ENCOUNTER — Encounter: Payer: Self-pay | Admitting: Neurology

## 2022-10-07 VITALS — BP 131/78 | HR 82 | Ht 72.0 in | Wt 259.8 lb

## 2022-10-07 DIAGNOSIS — G4733 Obstructive sleep apnea (adult) (pediatric): Secondary | ICD-10-CM | POA: Diagnosis not present

## 2022-10-07 DIAGNOSIS — E1159 Type 2 diabetes mellitus with other circulatory complications: Secondary | ICD-10-CM | POA: Diagnosis not present

## 2022-10-07 DIAGNOSIS — I152 Hypertension secondary to endocrine disorders: Secondary | ICD-10-CM

## 2022-10-07 DIAGNOSIS — G4734 Idiopathic sleep related nonobstructive alveolar hypoventilation: Secondary | ICD-10-CM

## 2022-10-07 NOTE — Progress Notes (Signed)
SLEEP MEDICINE CLINIC    Provider:  Larey Seat, MD  Primary Care Physician:  Horald Pollen, MD [place] [address]     Referring Provider: Agustina Caroli [place], [place] [address] [place],  [place] [address]          Chief Complaint according to patient   Patient presents with:     New Patient (Initial Visit)           HISTORY OF PRESENT ILLNESS:  Dominic [name] is a 47 y.o. male patient who is seen upon referral on 10/07/2022 from Dr Mitchel Honour for a new consultation, after a 7 year hiatus. .  Chief concern according to patient :  I need a new CPAP.    I have the pleasure of seeing Dominic [name] 10/07/22 a right -handed male with a possible sleep disorder.    The patient had the first sleep study in the year 2017 with [name] and I quoted the result:  The patient underwent a split-night polysomnography on 10-10-2015 this was 7 years ago.  He was diagnosed with an AHI of 35 which is a severe form of apnea and in rREM sleep his AHI was 67.8 in supine sleep his AHI was 46.5/h.  He had the lowest oxygen level at night at 65% saturation he did have 91 minutes of desaturation so this was also an added critical factor there were no periodic limb movements the patient was asked to return for an in lab titration he was using an AirFit P10 and large size with heated humidity his CPAP was then set to 10 cm water pressure.   The patient reports no difficulties with CPAP use. He has used a [employer]    He has quit drinking 30 month ago.  Quit smoking. Has lost weight initially after alcohol cessation but recently started gaining. He is walking every day. Full time gainfully employed in Teacher, music.  He sleeps 7 hours or more each night.  He lives  in a multigenerational home, with his parents , siblings are in [place].    He has been I moderately compliant patient.  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  He has a humidifier set at 3.  His residual AHI is 1.4/h and his average user time on days used is 4 hours 47 minutes.  His compliance however was only 50% today.  His Epworth Sleepiness Scale was endorsed at 11 points and his fatigue severity was not endorsed.  He is interested in repeating a home sleep test so that we have a new baseline on which we can then order the new machine.     Review of Systems: Out of a complete 14 system review, the patient complains of only the following symptoms, and all other reviewed systems are negative.:  How likely are you to doze in the following situations: 0 = not likely, 1 = slight chance, 2 = moderate chance, 3 = high chance   Sitting and Reading? Watching Television? Sitting inactive in a public place (theater or meeting)? As a passenger in a car for an hour without a break? Lying down in the afternoon when circumstances permit? Sitting and talking to someone? Sitting quietly after lunch without alcohol? In a car, while stopped for a few minutes in traffic?   Total = 11/ 24 points   FSS endorsed at 34/ 63 points.  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      Children:  4 children; no grandchildren      Tobacco: none   Social Determinants of  Health   Financial Resource Strain: Not on file  Food Insecurity: Not on file  Transportation Needs: Not on file  Physical Activity: Not on file  Stress: Not on file  Social Connections: Not on file    Family History  Problem Relation Age of Onset   Hypertension Mother    Diabetes Mother    Diabetes Father    Colon cancer Neg Hx    Colon polyps Neg Hx    Esophageal cancer Neg Hx    Rectal cancer Neg Hx    Stomach cancer Neg Hx     Past Medical History:  Diagnosis Date   Allergy    Diabetes mellitus without complication ([place])    GERD (gastroesophageal reflux disease)    Hernia    Hyperlipidemia    Hypertension     Past Surgical History:  Procedure Laterality Date   GROIN DISSECTION  11/19/2011   Procedure: GROIN EXPLORATION;  Surgeon: Harl Bowie, MD;  Location: [facility];  Service: General;  Laterality: Right;  Right Groin Exploration   INGUINAL HERNIA REPAIR  2012   right   INGUINAL HERNIA REPAIR  11/19/2011   Procedure: HERNIA REPAIR INGUINAL ADULT;  Surgeon: Harl Bowie, MD;  Location: [facility];  Service: General;  Laterality: Right;  excision of right groin mass     Current Outpatient Medications on File Prior to Visit  Medication Sig Dispense Refill   Accu-Chek FastClix Lancets MISC USE TO TEST BLOOD SUGAR UP TO 4 TIMES A DAY 200 each 3   ACCU-CHEK GUIDE test strip USE TO TEST BLOOD SUGAR UP TO 4 TIMES A DAY 200 each 5   blood glucose meter kit and supplies KIT Dispense based on patient and insurance preference. Use up to four times daily as directed. (FOR ICD-9 250.00, 250.01). 1 each 0   clotrimazole-betamethasone (LOTRISONE) cream APPLY TOPICALLY TO THE AFFECTED AREA TWICE DAILY 45 g 2   Continuous Blood Gluc Receiver (FREESTYLE LIBRE READER) DEVI Use to check blood sugars 1 each 0   fluticasone (FLONASE) 50 MCG/ACT nasal spray USE ONE SPRAY IN EACH NOSTRIL AS NEEDED 48 g 1   lisinopril (ZESTRIL) 20 MG tablet TAKE 1 TABLET(20 MG) BY MOUTH DAILY 90  tablet 3   loratadine (CLARITIN) 10 MG tablet TAKE 1 TABLET(10 MG) BY MOUTH DAILY 90 tablet 1   metFORMIN (GLUCOPHAGE) 500 MG tablet Take 1 tablet (500 mg total) by mouth 2 (two)  times daily with a meal. 180 tablet 3   omeprazole (PRILOSEC) 20 MG capsule TAKE 1 CAPSULE(20 MG) BY MOUTH DAILY 90 capsule 0   rosuvastatin (CRESTOR) 10 MG tablet TAKE 1 TABLET(10 MG) BY MOUTH DAILY 90 tablet 3   diclofenac (VOLTAREN) 75 MG EC tablet Take 1 tablet (75 mg total) by mouth 2 (two) times daily. (Patient not taking: Reported on 10/07/2022) 50 tablet 2   No current facility-administered medications on file prior to visit.    No Known Allergies   DIAGNOSTIC DATA (LABS, IMAGING, TESTING) - I reviewed patient records, labs, notes, testing and imaging myself where available.  Lab Results  Component Value Date   WBC 7.3 04/07/2021   HGB 16.1 04/07/2021   HCT 47.6 04/07/2021   MCV 90.6 04/07/2021   PLT 214.0 04/07/2021      Component Value Date/Time   NA 141 07/22/2022 0847   NA 140 10/13/2020 1603   K 4.0 07/22/2022 0847   CL 105 07/22/2022 0847   CO2 28 07/22/2022 0847   GLUCOSE 122 (H) 07/22/2022 0847   BUN 18 07/22/2022 0847   BUN 11 10/13/2020 1603   CREATININE 0.75 07/22/2022 0847   CREATININE 0.72 10/25/2015 0837   CALCIUM 9.6 07/22/2022 0847   PROT 8.1 07/22/2022 0847   PROT 7.7 10/13/2020 1603   ALBUMIN 4.4 07/22/2022 0847   ALBUMIN 4.7 10/13/2020 1603   AST 22 07/22/2022 0847   ALT 42 07/22/2022 0847   ALKPHOS 68 07/22/2022 0847   BILITOT 1.2 07/22/2022 0847   BILITOT 1.5 (H) 10/13/2020 1603   GFRNONAA 116 03/20/2020 0825   GFRAA 134 03/20/2020 0825   Lab Results  Component Value Date   CHOL 172 07/22/2022   HDL 39.20 07/22/2022   LDLCALC 107 (H) 07/22/2022   TRIG 130.0 07/22/2022   CHOLHDL 4 07/22/2022   Lab Results  Component Value Date   HGBA1C 6.2 (A) 07/22/2022   No results found for: "VITAMINB12" Lab Results  Component Value Date   TSH 3.150 07/27/2016     PHYSICAL EXAM:  Today's Vitals   10/07/22 1527  BP: 131/78  Pulse: 82  Weight: 259 lb 12.8 oz (117.8 kg)  Height: 6' (1.829 m)   Body mass index is 35.24 kg/m.   Wt Readings from Last 3 Encounters:  10/07/22 259 lb 12.8 oz (117.8 kg)  07/22/22 256 lb 9.6 oz (116.4 kg)  01/27/22 251 lb 6 oz (114 kg)     Ht Readings from Last 3 Encounters:  10/07/22 6' (1.829 m)  07/22/22 '5\' 11"'$  (1.803 m)  01/27/22 '5\' 11"'$  (1.803 m)       Normocephalic, atraumatic. Neck is supple. Mallampati 3,  neck circumference ; 20.5 . Nasal airflow congested , TMJ is not  evident . Retrognathia is*seen.  Cardiovascular:  Regular rate and rhythm  without  murmurs or carotid bruit, and without distended neck veins. Respiratory: Lungs are clear to auscultation. Skin:  Without evidence of edema, or rash Trunk: BMI is elevated . The patient's posture is erect     Neurologic exam : The patient is awake and alert, oriented to place and time.   Memory subjective  described as intact.  Attention span & concentration ability appears normal.  Speech is fluent,  without  dysarthria, dysphonia or aphasia.  Mood and affect are appropriate.   Cranial nerves: Pupils are equal and briskly reactive to light. Funduscopic exam without evidence of pallor or edema.  Extraocular movements  in vertical and horizontal  planes intact and without nystagmus. Visual fields by finger perimetry are intact. Hearing to finger rub intact.   Facial sensation intact to fine touch.  Facial motor strength is symmetric and tongue and uvula move midline. Shoulder shrug was symmetrical.    Motor exam:   Normal tone, muscle bulk and symmetric strength in all extremities.   Sensory:  Fine touch, pinprick and vibration were tested in all extremities. Proprioception tested in the upper extremities was normal.   Coordination: Rapid alternating movements in the fingers/hands was normal.  Gait and station: Patient walks without assistive  device and is able unassisted to climb up to the exam table. Strength within normal limits.  Stance is stable and normal.    Deep tendon reflexes: in the  upper and lower extremities are symmetric and intact.     ASSESSMENT AND PLAN 47 y.o. year old male  here with:    1)OSA patient :  severe OSA and hypoxia at night - need for a new CPAP machine , he uses an already recalled Respironics device. Machine was issued in march 2017.   2) new HST for new baseline needed , then new script for auto CPAP will be offered and he can choose a new interface as he likes. Uses a FFM currently.   3) RV after 30-90 days of new machine use.    I plan to follow up either personally or through our NP within 3-4 months.   I would like to thank Horald Pollen, MD and Agustina Caroli [place], [place] [address] [place],  [place] [address] for allowing me to meet with and to take care of this pleasant patient.   CC: I will share my notes with PCP.  After spending a total time of  35  minutes face to face and additional time for physical and neurologic examination, review of laboratory studies,  personal review of imaging studies, reports and results of other testing and review of referral information / records as far as provided in visit,   Electronically signed by: Larey Seat, MD 10/07/2022 3:48 PM  [facility] Board certified by The [employer] of Sleep Medicine and Diplomate of the [employer] of Sleep Medicine. Board certified In Neurology through the [employer], Fellow of the [employer] of Neurology. Medical Director of [facility].

## 2022-10-07 NOTE — Patient Instructions (Signed)
Living With Sleep Apnea Sleep apnea is a condition in which breathing pauses or becomes shallow during sleep. Sleep apnea is most commonly caused by a collapsed or blocked airway. People with sleep apnea usually snore loudly. They may have times when they gasp and stop breathing for 10 seconds or more during sleep. This may happen many times during the night. The breaks in breathing also interrupt the deep sleep that you need to feel rested. Even if you do not completely wake up from the gaps in breathing, your sleep may not be restful and you feel tired during the day. You may also have a headache in the morning and low energy during the day, and you may feel anxious or depressed. How can sleep apnea affect me? Sleep apnea increases your chances of extreme tiredness during the day (daytime fatigue). It can also increase your risk for health conditions, such as: Heart attack. Stroke. Obesity. Type 2 diabetes. Heart failure. Irregular heartbeat. High blood pressure. If you have daytime fatigue as a result of sleep apnea, you may be more likely to: Perform poorly at school or work. Fall asleep while driving. Have difficulty with attention. Develop depression or anxiety. Have sexual dysfunction. What actions can I take to manage sleep apnea? Sleep apnea treatment  If you were given a device to open your airway while you sleep, use it only as told by your health care provider. You may be given: An oral appliance. This is a custom-made mouthpiece that shifts your lower jaw forward. A continuous positive airway pressure (CPAP) device. This device blows air through a mask when you breathe out (exhale). A nasal expiratory positive airway pressure (EPAP) device. This device has valves that you put into each nostril. A bi-level positive airway pressure (BIPAP) device. This device blows air through a mask when you breathe in (inhale) and breathe out (exhale). You may need surgery if other treatments  do not work for you. Sleep habits Go to sleep and wake up at the same time every day. This helps set your internal clock (circadian rhythm) for sleeping. If you stay up later than usual, such as on weekends, try to get up in the morning within 2 hours of your normal wake time. Try to get at least 7-9 hours of sleep each night. Stop using a computer, tablet, and mobile phone a few hours before bedtime. Do not take long naps during the day. If you nap, limit it to 30 minutes. Have a relaxing bedtime routine. Reading or listening to music may relax you and help you sleep. Use your bedroom only for sleep. Keep your television and computer out of your bedroom. Keep your bedroom cool, dark, and quiet. Use a supportive mattress and pillows. Follow your health care provider's instructions for other changes to sleep habits. Nutrition Do not eat heavy meals in the evening. Do not have caffeine in the later part of the day. The effects of caffeine can last for more than 5 hours. Follow your health care provider's or dietitian's instructions for any diet changes. Lifestyle     Do not drink alcohol before bedtime. Alcohol can cause you to fall asleep at first, but then it can cause you to wake up in the middle of the night and have trouble getting back to sleep. Do not use any products that contain nicotine or tobacco. These products include cigarettes, chewing tobacco, and vaping devices, such as e-cigarettes. If you need help quitting, ask your health care provider. Medicines Take   over-the-counter and prescription medicines only as told by your health care provider. Do not use over-the-counter sleep medicine. You can become dependent on this medicine, and it can make sleep apnea worse. Do not use medicines, such as sedatives and narcotics, unless told by your health care provider. Activity Exercise on most days, but avoid exercising in the evening. Exercising near bedtime can interfere with  sleeping. If possible, spend time outside every day. Natural light helps regulate your circadian rhythm. General information Lose weight if you need to, and maintain a healthy weight. Keep all follow-up visits. This is important. If you are having surgery, make sure to tell your health care provider that you have sleep apnea. You may need to bring your device with you. Where to find more information Learn more about sleep apnea and daytime fatigue from: American Sleep Association: sleepassociation.org National Sleep Foundation: sleepfoundation.org National Heart, Lung, and Blood Institute: nhlbi.nih.gov Summary Sleep apnea is a condition in which breathing pauses or becomes shallow during sleep. Sleep apnea can cause daytime fatigue and other serious health conditions. You may need to wear a device while sleeping to help keep your airway open. If you are having surgery, make sure to tell your health care provider that you have sleep apnea. You may need to bring your device with you. Making changes to sleep habits, diet, lifestyle, and activity can help you manage sleep apnea. This information is not intended to replace advice given to you by your health care provider. Make sure you discuss any questions you have with your health care provider. Document Revised: 02/18/2021 Document Reviewed: 06/20/2020 Elsevier Patient Education  2023 Elsevier Inc.  

## 2022-10-21 ENCOUNTER — Telehealth: Payer: Self-pay | Admitting: Neurology

## 2022-10-21 NOTE — Telephone Encounter (Signed)
Pt called. Stated he is following-up on home sleep study.

## 2022-10-28 ENCOUNTER — Other Ambulatory Visit: Payer: Self-pay | Admitting: Emergency Medicine

## 2022-10-28 DIAGNOSIS — K219 Gastro-esophageal reflux disease without esophagitis: Secondary | ICD-10-CM

## 2022-10-31 ENCOUNTER — Other Ambulatory Visit: Payer: Self-pay | Admitting: Emergency Medicine

## 2022-10-31 DIAGNOSIS — E1159 Type 2 diabetes mellitus with other circulatory complications: Secondary | ICD-10-CM

## 2022-11-03 ENCOUNTER — Telehealth: Payer: Self-pay | Admitting: Neurology

## 2022-11-03 NOTE — Telephone Encounter (Signed)
MAIL OUT HST- [employer] no auth req spoke to Karsten Fells ref # [id number]- patient works out of town in [facility]   He is on the schedule for 11/10/2022.

## 2022-11-10 ENCOUNTER — Ambulatory Visit: Payer: BC Managed Care – PPO | Admitting: Neurology

## 2022-11-10 DIAGNOSIS — G4733 Obstructive sleep apnea (adult) (pediatric): Secondary | ICD-10-CM | POA: Diagnosis not present

## 2022-11-10 DIAGNOSIS — I152 Hypertension secondary to endocrine disorders: Secondary | ICD-10-CM

## 2022-11-16 NOTE — Progress Notes (Signed)
[facility]   HOME SLEEP TEST REPORT ( by Watch PAT)   STUDY DATE:  11-15-2022 Dominic [name] 47 year old male 06-02-1976   ORDERING CLINICIAN: Melvyn Novas, MD  REFERRING CLINICIAN: Dr Alvy Bimler, MD   CLINICAL INFORMATION/HISTORY: 10-07-2022;  I need a new CPAP.    I have the pleasure of seeing Dominic [name] :   The patient had the first sleep study in the year 2017 with Dominic [name] and I quote the result:  The patient underwent a split-night polysomnography on 10-10-2015 -this was 7 years ago.  He was diagnosed with an AHI of 35/h which is a severe form of apnea and in REM sleep his AHI was 67.8/h/  in supine sleep his AHI was 46.5/h.   He had the lowest oxygen level  Nadir at 65% saturation , he did have 91 minutes of desaturation:  so this was also an added critical factor there were no periodic limb movements the patient was asked to return for an in lab titration he was using an AirFit P10 and large size with heated humidity his CPAP was then set to 10 cm water pressure.   The patient reports no difficulties with CPAP use. He has used a [employer]  He has quit drinking 30 month ago. Quit smoking. Has lost weight initially after alcohol cessation but recently started gaining. He is walking every day. Gainfully employed.      Epworth sleepiness score: 11/24. FSS at 34/ 63    BMI: 35.2 kg/m   Neck Circumference: 20.5    FINDINGS:   Sleep Summary:   Total Recording Time (hours, min):   6 hours 2 minutes  Total Sleep Time (hours, min): 5 hours 43 minutes               Percent REM (%):     0                                   Respiratory Indices:   Calculated pAHI (per hour):    81.8/h                         REM pAHI: NREM pAHI: Not differentiated                            Supine AHI:   The patient slept spent 26 minutes in supine position with an AHI of 83.0/h.                                                Oxygen Saturation  Statistics:  O2 Saturation Range (%): Between a nadir at 80% and maximum at 97% and a mean saturation of 91% oxygen.                                     O2 Saturation (minutes) <89%:   50.5 minutes (!) O2 saturation (minutes) < 90%: 80.3 minutes , over 23% of total sleep time       Pulse Rate Statistics:   Pulse Mean (bpm):    61 bpm  Pulse Range:    Between 43 and 100 bpm             IMPRESSION:  This HST confirms the presence of very severe sleep apnea and very severe hypoxia during sleep.  This patient had 455 apneas and hypopneas in total and 347 oxygen desaturation events.  Even under CMS criteria his apnea would still be very severe with an AHI of 76.4/h. This degree of apnea and hypoxia can only be treated with positive airway pressure and I will start an auto titration device for urgent treatment access.  The machine will have a setting between 5 and 20 cm water pressure with 3 cm expiratory relief, heated humidification and interface to be fitted.   RECOMMENDATION:This degree of apnea and hypoxia can only be treated with positive airway pressure and I will start an auto titration device for urgent treatment access.  The machine will have a setting between 5 and 20 cm water pressure with 3 cm expiratory relief, heated humidification and interface to be fitted.    INTERPRETING PHYSICIAN:   Melvyn Novas, MD   Medical Director of [facility].

## 2022-11-16 NOTE — Procedures (Signed)
       [facility]   HOME SLEEP TEST REPORT ( by Watch PAT)   STUDY DATE:  11-15-2022 Dominic [name] 47 year old male 02/22/1976   ORDERING CLINICIAN: Krithika Tome, MD  REFERRING CLINICIAN: Dr Sagardia, MD   CLINICAL INFORMATION/HISTORY: 10-07-2022;  I need a new CPAP.    I have the pleasure of seeing Dominic [name] :   The patient had the first sleep study in the year 2017 with us and I quote the result:  The patient underwent a split-night polysomnography on 10-10-2015 -this was 7 years ago.  He was diagnosed with an AHI of 35/h which is a severe form of apnea and in REM sleep his AHI was 67.8/h/  in supine sleep his AHI was 46.5/h.   He had the lowest oxygen level  Nadir at 65% saturation , he did have 91 minutes of desaturation:  so this was also an added critical factor there were no periodic limb movements the patient was asked to return for an in lab titration he was using an AirFit P10 and large size with heated humidity his CPAP was then set to 10 cm water pressure.   The patient reports no difficulties with CPAP use. He has used a [employer] Respironics machine  He has quit drinking 30 month ago. Quit smoking. Has lost weight initially after alcohol cessation but recently started gaining. He is walking every day. Gainfully employed.      Epworth sleepiness score: 11/24. FSS at 34/ 63    BMI: 35.2 kg/m   Neck Circumference: 20.5    FINDINGS:   Sleep Summary:   Total Recording Time (hours, min):   6 hours 2 minutes  Total Sleep Time (hours, min): 5 hours 43 minutes               Percent REM (%):     0                                   Respiratory Indices:   Calculated pAHI (per hour):    81.8/h                         REM pAHI: NREM pAHI: Not differentiated                            Supine AHI:   The patient slept spent 26 minutes in supine position with an AHI of 83.0/h.                                                Oxygen Saturation  Statistics:  O2 Saturation Range (%): Between a nadir at 80% and maximum at 97% and a mean saturation of 91% oxygen.                                     O2 Saturation (minutes) <89%:   50.5 minutes (!) O2 saturation (minutes) < 90%: 80.3 minutes , over 23% of total sleep time       Pulse Rate Statistics:   Pulse Mean (bpm):    61 bpm               Pulse Range:    Between 43 and 100 bpm             IMPRESSION:  This HST confirms the presence of very severe sleep apnea and very severe hypoxia during sleep.  This patient had 455 apneas and hypopneas in total and 347 oxygen desaturation events.  Even under CMS criteria his apnea would still be very severe with an AHI of 76.4/h. This degree of apnea and hypoxia can only be treated with positive airway pressure and I will start an auto titration device for urgent treatment access.  The machine will have a setting between 5 and 20 cm water pressure with 3 cm expiratory relief, heated humidification and interface to be fitted.   RECOMMENDATION:This degree of apnea and hypoxia can only be treated with positive airway pressure and I will start an auto titration device for urgent treatment access.  The machine will have a setting between 5 and 20 cm water pressure with 3 cm expiratory relief, heated humidification and interface to be fitted.    INTERPRETING PHYSICIAN:   Melvyn Novas, MD   Medical Director of [facility].

## 2022-11-17 ENCOUNTER — Telehealth: Payer: Self-pay

## 2022-11-17 NOTE — Telephone Encounter (Signed)
Contacted pt back, I advised pt that Dr. Doristine Locks reviewed their sleep study results and found that pt very sever sleep apnea. Dr. Vickey Huger recommends that pt continue PAP. I reviewed PAP compliance expectations with the pt. Pt is agreeable to starting a CPAP. I advised pt that an order will be sent to a DME, [facility], and [facility] will call the pt within about one week after they file with the pt's insurance. [facility] will show the pt how to use the machine, fit for masks, and troubleshoot the CPAP if needed. A follow up appt was made for insurance purposes with Dr. Vickey Huger on July 18 330. Pt verbalized understanding to arrive 15 minutes early and bring their CPAP. Pt verbalized understanding of results. Pt had no questions at this time but was encouraged to call back if questions arise. I have sent the order to [facility] and have received confirmation that they have received the order.

## 2022-11-17 NOTE — Telephone Encounter (Signed)
Pt has called Sheryle Hail, CMA back.  He would like a call back on [phone]

## 2022-11-17 NOTE — Telephone Encounter (Signed)
Contacted pt, LVM rq call back  

## 2022-11-17 NOTE — Telephone Encounter (Signed)
-----   Message from Melvyn Novas, MD sent at 11/16/2022  5:59 PM EDT ----- HST with severe sleep apnea, severe hypoxia- Urgent need for CPAP therapy order placed.  Had a phillips [employer]ics machine since 2017, needs a replacement   Calculated pAHI (per hour): 81.8/h

## 2022-11-26 DIAGNOSIS — G4733 Obstructive sleep apnea (adult) (pediatric): Secondary | ICD-10-CM | POA: Diagnosis not present

## 2022-12-01 ENCOUNTER — Other Ambulatory Visit (HOSPITAL_COMMUNITY): Payer: Self-pay

## 2022-12-27 DIAGNOSIS — G4733 Obstructive sleep apnea (adult) (pediatric): Secondary | ICD-10-CM | POA: Diagnosis not present

## 2023-01-17 ENCOUNTER — Ambulatory Visit: Payer: BC Managed Care – PPO | Admitting: Emergency Medicine

## 2023-01-26 ENCOUNTER — Other Ambulatory Visit: Payer: Self-pay | Admitting: Emergency Medicine

## 2023-01-26 DIAGNOSIS — E1165 Type 2 diabetes mellitus with hyperglycemia: Secondary | ICD-10-CM

## 2023-01-26 DIAGNOSIS — K219 Gastro-esophageal reflux disease without esophagitis: Secondary | ICD-10-CM

## 2023-01-26 DIAGNOSIS — G4733 Obstructive sleep apnea (adult) (pediatric): Secondary | ICD-10-CM | POA: Diagnosis not present

## 2023-02-10 ENCOUNTER — Encounter: Payer: Self-pay | Admitting: Neurology

## 2023-02-10 ENCOUNTER — Ambulatory Visit: Payer: BC Managed Care – PPO | Admitting: Neurology

## 2023-02-10 VITALS — BP 128/72 | HR 63 | Ht 72.0 in | Wt 259.0 lb

## 2023-02-10 DIAGNOSIS — G4733 Obstructive sleep apnea (adult) (pediatric): Secondary | ICD-10-CM

## 2023-02-10 DIAGNOSIS — Z9989 Dependence on other enabling machines and devices: Secondary | ICD-10-CM

## 2023-02-10 NOTE — Progress Notes (Signed)
Provider:  Melvyn Novas, MD  Primary Care Physician:  Dominic Quint, MD [address] [place] [address]     Referring Provider: Edwina [name] [place], [place] [address] [place],  [place] [address]          Chief Complaint according to patient   Patient presents with:     New CPAP (Initial Visit)           HISTORY OF PRESENT ILLNESS:  Dominic [name] is a 47 y.o. male patient who is here for revisit 02/10/2023 for  HST follow up.  .  Chief concern according to patient :  here on his new CPAP machine after Sleep study.   As I quoted below Mr. Dominic [name] sleep study did show that he still have apnea and he has a severe degree of apnea he slept for 5 hours and 43 minutes during the home sleep test and his AHI was 81.8.  There was also a lot of of low oxygen events and his heart rate varied greatly between a slow heart rate of 43 bpm and a higher heart rate of 100 bpm.  He had over 18 minutes of oxygen desaturation under 90%.  For this reason he was placed back on CPAP he has been 77% compliant with his machine over the last 90 days he is using the machine on average 5 hours and 17 minutes at night he is slowly getting better with it.  This is an AutoSet between 5 and 20 cmH2O with 1 cm EPR and his residual apnea-hypopnea index is 0.9/h  His 95th percentile pressure is 12 cmH2O and she does have some significant air leakage but seeing the reduction in AHI I will not further investigate that.  I would like for him to have an overnight pulse oximetry 1 night while on CPAP to make sure that he truly has no longer oxygen need.  Pretest HST study his Epworth score was 11 and his fatigue severity score 34 points, t oday his Epworth score is 5 ( from 11 points ) and his FSS at 9/ 63  from 34 points.  He feels  energized in the morning, he sleeps through the night 6 hours. Uses nasal pillows.  He has no headaches, no concentration difficulties.          10-07-2022;  I need  a new CPAP.    I have the pleasure of seeing Dominic [name] :   The patient had the first sleep study in the year 2017 with [name] and I quote the result:  The patient underwent a split-night polysomnography on 10-10-2015 -this was 7 years ago.  He was diagnosed with an AHI of 35/h which is a severe form of apnea and in REM sleep his AHI was 67.8/h/  in supine sleep his AHI was 46.5/h.   He had the lowest oxygen level  Nadir at 65% saturation , he did have 91 minutes of desaturation:  so this was also an added critical factor there were no periodic limb movements the patient was asked to return for an in lab titration he was using an AirFit P10 and large size with heated humidity his CPAP was then set to 10 cm water pressure.   The patient reports no difficulties with CPAP use. He has used a [employer]  He has quit drinking 30 month ago. Quit smoking. Has lost weight initially after alcohol cessation but recently started  gaining. He is walking every day. Gainfully employed.        Epworth sleepiness score: 11/24. FSS at 34/ 63    BMI: 35.2 kg/m   Neck Circumference: 20.5    FINDINGS:   Sleep Summary:   Total Recording Time (hours, min):   6 hours 2 minutes  Total Sleep Time (hours, min): 5 hours 43 minutes               Percent REM (%):     0                                   Respiratory Indices:   Calculated pAHI (per hour):    81.8/h                         REM pAHI: NREM pAHI: Not differentiated                            Supine AHI:   The patient slept spent 26 minutes in supine position with an AHI of 83.0/h.                                                Oxygen Saturation Statistics:  O2 Saturation Range (%): Between a nadir at 80% and maximum at 97% and a mean saturation of 91% oxygen.                                     O2 Saturation (minutes) <89%:   50.5 minutes (!) O2 saturation (minutes) < 90%: 80.3 minutes , over 23% of total sleep time        Pulse Rate Statistics:   Pulse Mean (bpm):    61 bpm             Pulse Range:    Between 43 and 100 bpm             IMPRESSION:  This HST confirms the presence of very severe sleep apnea and very severe hypoxia during sleep.  This patient had 455 apneas and hypopneas in total and 347 oxygen desaturation events.  Even under CMS criteria his apnea would still be very severe with an AHI of 76.4/h. This degree of apnea and hypoxia can only be treated with positive airway pressure and I will start an auto titration device for urgent treatment access.  The machine will have a setting between 5 and 20 cm water pressure with 3 cm expiratory relief, heated humidification and interface to be fitted.   RECOMMENDATION:This degree of apnea and hypoxia can only be treated with positive airway pressure and I will start an auto titration device for urgent treatment access.  The machine will have a setting between 5 and 20 cm water pressure with 3 cm expiratory relief, heated humidification and interface to be fitted.     INTERPRETING PHYSICIAN:    Dominic Novas, MD    Medical Director of [facility] at [facility].  The patient reports no difficulties with CPAP use. He has used a [employer]      He has quit drinking 30 month ago.  Quit smoking. Has lost weight initially after alcohol cessation but recently started gaining. He is walking every day. Full time gainfully employed in Publishing copy.  He sleeps 7 hours or more each night.  He lives  in a multigenerational home, with his parents , siblings are in [place].      He has been I moderately compliant patient.  He still uses his CPAP at 10 cm water pressure was 2 cm EPR this is a Sports administrator.  He has a humidifier set at 3.  His residual AHI is 1.4/h and his average user time on days used is 4 hours 47 minutes.  His compliance however was only 50% today.  His  Epworth Sleepiness Scale was endorsed at 11 points and his fatigue severity was not endorsed.  He is interested in repeating a home sleep test so that we have a new baseline on which we can then order the new machine.    Review of Systems: Out of a complete 14 system review, the patient complains of only the following symptoms, and all other reviewed systems are negative.:     How likely are you to doze in the following situations: 0 = not likely, 1 = slight chance, 2 = moderate chance, 3 = high chance   Sitting and Reading? Watching Television? Sitting inactive in a public place (theater or meeting)? As a passenger in a car for an hour without a break? Lying down in the afternoon when circumstances permit? Sitting and talking to someone? Sitting quietly after lunch without alcohol? In a car, while stopped for a few minutes in traffic?   Total = 5/ 24 points   FSS endorsed at 9/ 63 points.   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      Children:  4 children; no grandchildren      Tobacco: none   Social Determinants of Corporate investment banker Strain: Not on file  Food Insecurity: Not on file  Transportation Needs: Not on file  Physical Activity: Not on file  Stress: Not on file  Social  Connections: Not on file    Family History  Problem Relation Age of Onset   Hypertension Mother    Diabetes Mother    Diabetes Father    Colon cancer Neg Hx    Colon polyps Neg Hx    Esophageal cancer Neg Hx    Rectal cancer Neg Hx    Stomach cancer Neg Hx  Past Medical History:  Diagnosis Date   Allergy    Diabetes mellitus without complication (HCC)    GERD (gastroesophageal reflux disease)    Hernia    Hyperlipidemia    Hypertension     Past Surgical History:  Procedure Laterality Date   GROIN DISSECTION  11/19/2011   Procedure: GROIN EXPLORATION;  Surgeon: Shelly Rubenstein, MD;  Location: [facility];  Service: General;  Laterality: Right;  Right Groin Exploration   INGUINAL HERNIA REPAIR  2012   right   INGUINAL HERNIA REPAIR  11/19/2011   Procedure: HERNIA REPAIR INGUINAL ADULT;  Surgeon: Shelly Rubenstein, MD;  Location: [facility];  Service: General;  Laterality: Right;  excision of right groin mass     Current Outpatient Medications on File Prior to Visit  Medication Sig Dispense Refill   Accu-Chek FastClix Lancets MISC USE TO TEST BLOOD SUGAR UP TO 4 TIMES A DAY 200 each 3   ACCU-CHEK GUIDE test strip USE TO TEST BLOOD SUGAR UP TO 4 TIMES A DAY 200 each 5   blood glucose meter kit and supplies KIT Dispense based on patient and insurance preference. Use up to four times daily as directed. (FOR ICD-9 250.00, 250.01). 1 each 0   clotrimazole-betamethasone (LOTRISONE) cream APPLY TOPICALLY TO THE AFFECTED AREA TWICE DAILY 45 g 2   Continuous Blood Gluc Receiver (FREESTYLE LIBRE READER) DEVI Use to check blood sugars 1 each 0   diclofenac (VOLTAREN) 75 MG EC tablet Take 1 tablet (75 mg total) by mouth 2 (two) times daily. 50 tablet 2   fluticasone (FLONASE) 50 MCG/ACT nasal spray USE ONE SPRAY IN EACH NOSTRIL AS NEEDED 48 g 1   lisinopril (ZESTRIL) 20 MG tablet TAKE 1 TABLET(20 MG) BY MOUTH DAILY 90 tablet 3   loratadine (CLARITIN) 10 MG tablet TAKE 1 TABLET(10 MG) BY  MOUTH DAILY 90 tablet 1   omeprazole (PRILOSEC) 20 MG capsule TAKE 1 CAPSULE(20 MG) BY MOUTH DAILY 90 capsule 0   rosuvastatin (CRESTOR) 10 MG tablet TAKE 1 TABLET(10 MG) BY MOUTH DAILY 90 tablet 3   metFORMIN (GLUCOPHAGE) 500 MG tablet Take 1 tablet (500 mg total) by mouth 2 (two) times daily with a meal. 180 tablet 3   No current facility-administered medications on file prior to visit.    No Known Allergies   DIAGNOSTIC DATA (LABS, IMAGING, TESTING) - I reviewed patient records, labs, notes, testing and imaging myself where available.  Lab Results  Component Value Date   WBC 7.3 04/07/2021   HGB 16.1 04/07/2021   HCT 47.6 04/07/2021   MCV 90.6 04/07/2021   PLT 214.0 04/07/2021      Component Value Date/Time   NA 141 07/22/2022 0847   NA 140 10/13/2020 1603   K 4.0 07/22/2022 0847   CL 105 07/22/2022 0847   CO2 28 07/22/2022 0847   GLUCOSE 122 (H) 07/22/2022 0847   BUN 18 07/22/2022 0847   BUN 11 10/13/2020 1603   CREATININE 0.75 07/22/2022 0847   CREATININE 0.72 10/25/2015 0837   CALCIUM 9.6 07/22/2022 0847   PROT 8.1 07/22/2022 0847   PROT 7.7 10/13/2020 1603   ALBUMIN 4.4 07/22/2022 0847   ALBUMIN 4.7 10/13/2020 1603   AST 22 07/22/2022 0847   ALT 42 07/22/2022 0847   ALKPHOS 68 07/22/2022 0847   BILITOT 1.2 07/22/2022 0847   BILITOT 1.5 (H) 10/13/2020 1603   GFRNONAA 116 03/20/2020 0825   GFRAA 134 03/20/2020 0825   Lab Results  Component Value  Date   CHOL 172 07/22/2022   HDL 39.20 07/22/2022   LDLCALC 107 (H) 07/22/2022   TRIG 130.0 07/22/2022   CHOLHDL 4 07/22/2022   Lab Results  Component Value Date   HGBA1C 6.2 (A) 07/22/2022   No results found for: "VITAMINB12" Lab Results  Component Value Date   TSH 3.150 07/27/2016    PHYSICAL EXAM:  Today's Vitals   02/10/23 1503  BP: 128/72  Pulse: 63  Weight: 259 lb (117.5 kg)   Body mass index is 35.13 kg/m.   Wt Readings from Last 3 Encounters:  02/10/23 259 lb (117.5 kg)  10/07/22 259 lb  12.8 oz (117.8 kg)  07/22/22 256 lb 9.6 oz (116.4 kg)     Ht Readings from Last 3 Encounters:  10/07/22 6' (1.829 m)  07/22/22 5\' 11"  (1.803 m)  01/27/22 5\' 11"  (1.803 m)      General: Neck is supple. Mallampati 3,  neck circumference ; 20.5 . Nasal airflow congested , TMJ is not  evident . Retrognathia is*seen.  Cardiovascular:  Regular rate and rhythm  without  murmurs or carotid bruit, and without distended neck veins. Respiratory: Lungs are clear to auscultation. Skin:  Without evidence of edema, or rash Trunk: BMI is elevated . The patient's posture is erect     Neurologic exam : The patient is awake and alert, oriented to place and time.   Memory subjective  described as intact.  Attention span & concentration ability appears normal.  Speech is fluent,  without  dysarthria, dysphonia or aphasia.  Mood and affect are appropriate.   Cranial nerves: Pupils are equal and briskly reactive to light. Funduscopic exam without evidence of pallor or edema.  Extraocular movements  in vertical and horizontal planes intact and without nystagmus. Visual fields by finger perimetry are intact. Hearing to finger rub intact.   Facial sensation intact to fine touch.  Facial motor strength is symmetric and tongue and uvula move midline. Shoulder shrug was symmetrical.       ASSESSMENT AND PLAN 47 y.o. year old male  here with:   Severe OSA and hypoxia of sleep:  now back on CPAP> I would like for him to have an overnight pulse oximetry 1 night while on CPAP to make sure that he truly has no longer oxygen need.     Pretest HST study his Epworth score was 11 and his fatigue severity score 34 points, t oday his Epworth score is 5 ( from 11 points ) and his FSS at 9/ 63  from 34 points.  He feels  energized in the morning, he sleeps through the night 6 hours. Uses nasal pillows.  He has no headaches, no concentration difficulties.   I plan to follow up either personally or through our NP  within 12 months.   I would like to thank [place], [place], [place] [address] [place],  [place] [address] for allowing me to meet with and to take care of this pleasant patient.   After spending a total time of  12 minutes face to face and additional time for physical and neurologic examination, review of laboratory studies,  personal review of imaging studies, reports and results of other testing and review of referral information / records as far as provided in visit,   Electronically signed by: Dominic Novas, MD 02/10/2023 3:03 PM  [facility] and [facility] Board certified by The [employer] of Sleep Medicine and Diplomate of the [employer] of Sleep  Medicine. Board certified In Neurology through the ABPN, Fellow of the [employer] of Neurology.

## 2023-02-10 NOTE — Patient Instructions (Signed)
Once a year RV.

## 2023-02-22 ENCOUNTER — Ambulatory Visit: Payer: BC Managed Care – PPO | Admitting: Emergency Medicine

## 2023-02-26 DIAGNOSIS — G4733 Obstructive sleep apnea (adult) (pediatric): Secondary | ICD-10-CM | POA: Diagnosis not present

## 2023-04-06 DIAGNOSIS — G4733 Obstructive sleep apnea (adult) (pediatric): Secondary | ICD-10-CM | POA: Diagnosis not present

## 2023-04-07 ENCOUNTER — Ambulatory Visit: Payer: BC Managed Care – PPO | Admitting: Emergency Medicine

## 2023-04-07 ENCOUNTER — Encounter: Payer: Self-pay | Admitting: Emergency Medicine

## 2023-04-07 VITALS — BP 130/80 | HR 58 | Temp 98.6°F | Ht 72.0 in | Wt 255.2 lb

## 2023-04-07 DIAGNOSIS — E1159 Type 2 diabetes mellitus with other circulatory complications: Secondary | ICD-10-CM | POA: Diagnosis not present

## 2023-04-07 DIAGNOSIS — I152 Hypertension secondary to endocrine disorders: Secondary | ICD-10-CM

## 2023-04-07 DIAGNOSIS — E1169 Type 2 diabetes mellitus with other specified complication: Secondary | ICD-10-CM | POA: Diagnosis not present

## 2023-04-07 DIAGNOSIS — Z7984 Long term (current) use of oral hypoglycemic drugs: Secondary | ICD-10-CM

## 2023-04-07 DIAGNOSIS — E785 Hyperlipidemia, unspecified: Secondary | ICD-10-CM

## 2023-04-07 LAB — COMPREHENSIVE METABOLIC PANEL
ALT: 58 U/L — ABNORMAL HIGH (ref 0–53)
AST: 38 U/L — ABNORMAL HIGH (ref 0–37)
Albumin: 4.3 g/dL (ref 3.5–5.2)
Alkaline Phosphatase: 66 U/L (ref 39–117)
BUN: 13 mg/dL (ref 6–23)
CO2: 26 meq/L (ref 19–32)
Calcium: 9.1 mg/dL (ref 8.4–10.5)
Chloride: 102 meq/L (ref 96–112)
Creatinine, Ser: 0.73 mg/dL (ref 0.40–1.50)
GFR: 107.07 mL/min (ref >60.00)
Glucose, Bld: 94 mg/dL (ref 70–99)
Potassium: 3.7 meq/L (ref 3.5–5.1)
Sodium: 139 meq/L (ref 135–145)
Total Bilirubin: 1.6 mg/dL — ABNORMAL HIGH (ref 0.2–1.2)
Total Protein: 7.7 g/dL (ref 6.0–8.3)

## 2023-04-07 LAB — LIPID PANEL
Cholesterol: 111 mg/dL (ref 0–200)
HDL: 36.5 mg/dL — ABNORMAL LOW (ref >39.00)
LDL Cholesterol: 45 mg/dL (ref 0–99)
NonHDL: 74.27
Total CHOL/HDL Ratio: 3
Triglycerides: 145 mg/dL (ref 0.0–149.0)
VLDL: 29 mg/dL (ref 0.0–40.0)

## 2023-04-07 LAB — CBC WITH DIFFERENTIAL/PLATELET
Basophils Absolute: 0.1 10*3/uL (ref 0.0–0.1)
Basophils Relative: 0.7 % (ref 0.0–3.0)
Eosinophils Absolute: 0.5 10*3/uL (ref 0.0–0.7)
Eosinophils Relative: 6 % — ABNORMAL HIGH (ref 0.0–5.0)
HCT: 45.5 % (ref 39.0–52.0)
Hemoglobin: 15.4 g/dL (ref 13.0–17.0)
Lymphocytes Relative: 23.4 % (ref 12.0–46.0)
Lymphs Abs: 2 10*3/uL (ref 0.7–4.0)
MCHC: 33.8 g/dL (ref 30.0–36.0)
MCV: 91.1 fl (ref 78.0–100.0)
Monocytes Absolute: 0.7 10*3/uL (ref 0.1–1.0)
Monocytes Relative: 8.5 % (ref 3.0–12.0)
Neutro Abs: 5.1 10*3/uL (ref 1.4–7.7)
Neutrophils Relative %: 61.4 % (ref 43.0–77.0)
Platelets: 200 10*3/uL (ref 150.0–400.0)
RBC: 5 Mil/uL (ref 4.22–5.81)
RDW: 13 % (ref 11.5–15.5)
WBC: 8.4 10*3/uL (ref 4.0–10.5)

## 2023-04-07 LAB — POCT GLYCOSYLATED HEMOGLOBIN (HGB A1C): Hemoglobin A1C: 6 % — AB (ref 4.0–5.6)

## 2023-04-07 LAB — MICROALBUMIN / CREATININE URINE RATIO
Creatinine,U: 98.2 mg/dL
Microalb Creat Ratio: 0.7 mg/g (ref 0.0–30.0)
Microalb, Ur: <0.7 mg/dL (ref 0.0–1.9)

## 2023-04-07 MED ORDER — METFORMIN HCL 500 MG PO TABS
500.0000 mg | ORAL_TABLET | Freq: Two times a day (BID) | ORAL | 3 refills | Status: DC
Start: 2023-04-07 — End: 2024-05-25

## 2023-04-07 NOTE — Assessment & Plan Note (Signed)
BP Readings from Last 3 Encounters:  04/07/23 130/80  02/10/23 128/72  10/07/22 131/78  Well-controlled hypertension Continue lisinopril 20 mg daily Well-controlled diabetes with hemoglobin A1c of 6.0 Continue metformin 500 mg twice a day Cardiovascular risks associated with hypertension and diabetes discussed Diet and nutrition discussed Follow-up in 6 months

## 2023-04-07 NOTE — Patient Instructions (Signed)
Mantenimiento de Research officer, political party, Male Adoptar un estilo de vida saludable y recibir atencin preventiva son importantes para promover la salud y Counsellor. Consulte al mdico sobre: El esquema adecuado para hacerse pruebas y exmenes peridicos. Cosas que puede hacer por su cuenta para prevenir enfermedades y [place] sano. Qu debo saber sobre la dieta, el peso y el ejercicio? Consuma una dieta saludable  Consuma una dieta que incluya muchas verduras, frutas, productos lcteos con bajo contenido de [place] y Associate Professor. No consuma muchos alimentos ricos en grasas slidas, azcares agregados o sodio. Mantenga un peso saludable El ndice de masa muscular [facility]) es una medida que puede utilizarse para identificar posibles problemas de [place]. Proporciona una estimacin de la grasa corporal basndose en el peso y la altura. Su mdico puede ayudarle a Engineer, site IMC y a Personnel officer o Pharmacologist un peso saludable. Haga ejercicio con regularidad Haga ejercicio con regularidad. Esta es una de las prcticas ms importantes que puede hacer por su salud. La [employer] de los adultos deben seguir estas pautas: Education officer, environmental, al menos, 150 minutos de actividad fsica por semana. El ejercicio debe aumentar la frecuencia cardaca y Media planner transpirar (ejercicio de intensidad moderada). Hacer ejercicios de fortalecimiento por lo [facility] por semana. Agregue esto a su plan de ejercicio de intensidad moderada. Pase menos tiempo sentado. Incluso la actividad fsica ligera puede ser beneficiosa. Controle sus niveles de colesterol y lpidos en la sangre Comience a realizarse anlisis de lpidos y Oncologist en la sangre a los 20 aos y luego reptalos cada 5 aos. Es posible que Insurance underwriter los niveles de colesterol con mayor frecuencia si: Sus niveles de lpidos y colesterol son altos. Es mayor de 40 aos. Presenta un alto riesgo de padecer enfermedades cardacas. Qu debo  saber sobre las pruebas de deteccin del cncer? Muchos tipos de cncer pueden detectarse de manera temprana y, a menudo, pueden prevenirse. Segn su historia clnica y sus antecedentes familiares, es posible que deba realizarse pruebas de deteccin del cncer en diferentes edades. Esto puede incluir pruebas de deteccin de lo siguiente: Building services engineer. Cncer de prstata. Cncer de piel. Cncer de pulmn. Qu debo saber sobre la enfermedad cardaca, la diabetes y la hipertensin arterial? Presin arterial y enfermedad cardaca La hipertensin arterial causa enfermedades cardacas y [place] el riesgo de accidente cerebrovascular. Es ms probable que esto se manifieste en las personas que tienen lecturas de presin arterial alta o tienen sobrepeso. Hable con el mdico sobre sus valores de presin arterial deseados. Hgase controlar la presin arterial: Cada 3 a 5 aos si tiene entre 18 y 30 aos. Todos los aos si es mayor de 40 aos. Si tiene entre 65 y 65 aos y es fumador o Insurance underwriter, pregntele al mdico si debe realizarse una prueba de deteccin de aneurisma artico abdominal (AAA) por nica vez. Diabetes Realcese exmenes de deteccin de la diabetes con regularidad. Este anlisis revisa el nivel de azcar en la sangre en [place]. Hgase las pruebas de deteccin: Cada tres aos despus de los 45 aos de edad si tiene un peso normal y un bajo riesgo de padecer diabetes. Con ms frecuencia y a partir de [place] edad inferior si tiene sobrepeso o un alto riesgo de padecer diabetes. Qu debo saber sobre la prevencin de infecciones? Hepatitis B Si tiene un riesgo ms alto de contraer hepatitis B, debe someterse a un examen de deteccin de este virus. Hable con el mdico para averiguar si tiene riesgo de  contraer la infeccin por hepatitis B. Hepatitis C Se recomienda un anlisis de [place] para: Todos los que nacieron entre 1945 y [phone]. Todas las personas que tengan un riesgo de haber  contrado hepatitis C. Enfermedades de transmisin sexual (ETS) Debe realizarse pruebas de deteccin de ITS todos los aos, incluidas la gonorrea y la clamidia, si: Es sexualmente activo y es menor de [address]. Es mayor de [address], y Public affairs consultant informa que corre riesgo de tener este tipo de infecciones. La actividad sexual ha cambiado desde que le hicieron la ltima prueba de deteccin y tiene un riesgo mayor de Warehouse manager clamidia o Copy. Pregntele al mdico si usted tiene riesgo. Pregntele al mdico si usted tiene un alto riesgo de Primary school teacher VIH. El mdico tambin puede recomendarle un medicamento recetado para ayudar a evitar la infeccin por el VIH. Si elige tomar medicamentos para prevenir el VIH, primero debe [employer] de deteccin del VIH. Luego debe hacerse anlisis cada 3 meses mientras est tomando los medicamentos. Siga estas indicaciones en su casa: Consumo de alcohol No beba alcohol si el mdico se lo prohbe. Si bebe alcohol: Limite la cantidad que consume de 0 a 2 bebidas por da. Sepa cunta cantidad de alcohol hay en las bebidas que toma. En los [address], una medida equivale a una botella de cerveza de 12 oz (355 ml), un vaso de vino de 5 oz (148 ml) o un vaso de una bebida alcohlica de alta graduacin de 1 oz (44 ml). Estilo de vida No consuma ningn producto que contenga nicotina o tabaco. Estos productos incluyen cigarrillos, tabaco para Theatre manager y aparatos de vapeo, como los Administrator, Civil Service. Si necesita ayuda para dejar de consumir estos productos, consulte al mdico. No consuma drogas. No comparta agujas. Solicite ayuda a su mdico si necesita apoyo o informacin para abandonar las drogas. Indicaciones generales Realcese los estudios de rutina de [address], dentales y de Wellsite geologist. Mantngase al da con las vacunas. Infrmele a su mdico si: Se siente deprimido con frecuencia. Alguna vez ha sido vctima de [place] o no se siente seguro en su  casa. Resumen Adoptar un estilo de vida saludable y recibir atencin preventiva son importantes para promover la salud y Counsellor. Siga las instrucciones del mdico acerca de una dieta saludable, el ejercicio y la realizacin de pruebas o exmenes para Hotel manager. Siga las instrucciones del mdico con respecto al control del colesterol y la presin arterial. Esta informacin no tiene Theme park manager el consejo del mdico. Asegrese de hacerle al mdico cualquier pregunta que tenga. Document Revised: 12/17/2020 Document Reviewed: 12/17/2020 Elsevier Patient Education  2024 [employer].

## 2023-04-07 NOTE — Progress Notes (Signed)
Dominic [name] 47 y.o.   Chief Complaint  Patient presents with   Medical Management of Chronic Issues    F/u appt DM, no concerns     HISTORY OF PRESENT ILLNESS: This is a 47 y.o. male here for follow-up of diabetes and hypertension. Overall doing well.  Has no complaints or medical concerns. Eating better and staying physically active. Wt Readings from Last 3 Encounters:  04/07/23 255 lb 4 oz (115.8 kg)  02/10/23 259 lb (117.5 kg)  10/07/22 259 lb 12.8 oz (117.8 kg)     HPI   Prior to Admission medications   Medication Sig Start Date End Date Taking? Authorizing Provider  Accu-Chek FastClix Lancets MISC USE TO TEST BLOOD SUGAR UP TO 4 TIMES A DAY 04/07/21  Yes Pryor Guettler, Eilleen Kempf, MD  ACCU-CHEK GUIDE test strip USE TO TEST BLOOD SUGAR UP TO 4 TIMES A DAY 04/07/21  Yes Reid Nawrot, Eilleen Kempf, MD  blood glucose meter kit and supplies KIT Dispense based on patient and insurance preference. Use up to four times daily as directed. (FOR ICD-9 250.00, 250.01). 07/11/18  Yes Mattison Stuckey, Eilleen Kempf, MD  clotrimazole-betamethasone (LOTRISONE) cream APPLY TOPICALLY TO THE AFFECTED AREA TWICE DAILY 05/20/22  Yes Edoardo Laforte, Eilleen Kempf, MD  fluticasone [facility]) 50 MCG/ACT nasal spray USE ONE SPRAY IN EACH NOSTRIL AS NEEDED 09/24/22  Yes Danessa Mensch, Eilleen Kempf, MD  lisinopril (ZESTRIL) 20 MG tablet TAKE 1 TABLET(20 MG) BY MOUTH DAILY 10/31/22  Yes Samaya Boardley, Eilleen Kempf, MD  loratadine (CLARITIN) 10 MG tablet TAKE 1 TABLET(10 MG) BY MOUTH DAILY 09/24/22  Yes Yousuf Ager, Eilleen Kempf, MD  omeprazole (PRILOSEC) 20 MG capsule TAKE 1 CAPSULE(20 MG) BY MOUTH DAILY 01/26/23  Yes Raegen Tarpley, Eilleen Kempf, MD  rosuvastatin (CRESTOR) 10 MG tablet TAKE 1 TABLET(10 MG) BY MOUTH DAILY 01/26/23  Yes Venson Ferencz, Eilleen Kempf, MD  Continuous Blood Gluc Receiver (FREESTYLE LIBRE READER) DEVI Use to check blood sugars 08/12/22   Georgina Quint, MD  diclofenac (VOLTAREN) 75 MG EC tablet Take 1 tablet (75 mg total) by mouth 2  (two) times daily. Patient not taking: Reported on 04/07/2023 01/22/21   Lenn Sink, DPM  metFORMIN (GLUCOPHAGE) 500 MG tablet Take 1 tablet (500 mg total) by mouth 2 (two) times daily with a meal. 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 02/10/2023   Morbid obesity (HCC) 02/10/2023   Chronic intermittent hypoxia with obstructive sleep apnea 10/07/2022   Severe obstructive sleep apnea-hypopnea syndrome 10/07/2022   Dyslipidemia associated with type 2 diabetes mellitus (HCC) 04/21/2018   OSA on CPAP 01/15/2016   Hypertension associated with diabetes (HCC) 09/22/2015   Obesity 05/31/2014    Past Medical History:  Diagnosis Date   Allergy    Diabetes mellitus without complication (HCC)    GERD (gastroesophageal reflux disease)    Hernia    Hyperlipidemia    Hypertension     Past Surgical History:  Procedure Laterality Date   GROIN DISSECTION  11/19/2011   Procedure: Drucie Ip EXPLORATION;  Surgeon: Shelly Rubenstein, MD;  Location: [facility];  Service: General;  Laterality: Right;  Right Groin Exploration   INGUINAL HERNIA REPAIR  2012   right   INGUINAL HERNIA REPAIR  11/19/2011   Procedure: HERNIA REPAIR INGUINAL ADULT;  Surgeon: Shelly Rubenstein, MD;  Location: [facility];  Service: General;  Laterality: Right;  excision of right groin  mass    Social History   Socioeconomic History   Marital status: Married    Spouse name: Not on file   Number of children: Not on file   Years of education: Not on file   Highest education level: Not on file  Occupational History    Comment: Demolition work  Tobacco Use   Smoking status: Former    Current packs/day: 0.25    Average packs/day: 0.3 packs/day for 3.0 years (0.8 ttl pk-yrs)    Types: Cigarettes   Smokeless tobacco: Never  Vaping Use   Vaping status: Never Used  Substance and Sexual Activity   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      Children:  4 children; no grandchildren      Tobacco: none   Social Determinants of Corporate investment banker Strain: Not on file  Food Insecurity: Not on file  Transportation Needs: Not on file  Physical Activity: Not on file  Stress: Not on file  Social Connections: Not on file  Intimate Partner Violence: Not on file    Family History  Problem Relation Age of Onset   Hypertension Mother    Diabetes Mother    Diabetes Father    Colon cancer Neg Hx    Colon polyps Neg Hx    Esophageal cancer Neg Hx    Rectal cancer Neg Hx    Stomach cancer Neg Hx      Review of Systems  Constitutional: Negative.  Negative for chills and fever.  HENT: Negative.  Negative for congestion and sore throat.   Respiratory: Negative.  Negative for cough and shortness of breath.   Cardiovascular: Negative.  Negative for chest pain and palpitations.  Gastrointestinal:  Negative for abdominal pain, nausea and vomiting.  Genitourinary: Negative.  Negative for dysuria and hematuria.  Skin: Negative.  Negative for rash.  Neurological: Negative.  Negative for dizziness and headaches.  All other systems reviewed and are negative.   Vitals:   04/07/23 0916  BP: 130/80  Pulse: (!) 58  Temp: 98.6 F (37 C)  SpO2: 98%    Physical Exam Vitals reviewed.  Constitutional:      Appearance: Normal appearance.  HENT:     Head: Normocephalic.     Mouth/Throat:     Mouth: Mucous membranes are moist.     Pharynx: Oropharynx is clear.  Eyes:     Extraocular Movements: Extraocular movements intact.  Cardiovascular:     Rate and Rhythm: Normal rate and regular rhythm.  Pulmonary:     Effort: Pulmonary effort is normal.     Breath sounds: Normal breath sounds.  Musculoskeletal:        General:  Normal range of motion.     Cervical back: No tenderness.  Lymphadenopathy:     Cervical: No cervical adenopathy.  Skin:    General: Skin is warm and dry.  Neurological:     General: No focal deficit present.     Mental Status: He is alert and oriented to person, place, and time.  Psychiatric:        Mood and Affect: Mood normal.        Behavior: Behavior normal.    Results for orders placed or performed in visit on  04/07/23 (from the past 24 hour(s))  POCT HgB A1C     Status: Abnormal   Collection Time: 04/07/23 11:54 AM  Result Value Ref Range   Hemoglobin A1C 6.0 (A) 4.0 - 5.6 %   HbA1c POC (<> result, manual entry)     HbA1c, POC (prediabetic range)     HbA1c, POC (controlled diabetic range)       ASSESSMENT & PLAN: A total of 44 minutes was spent with the patient and counseling/coordination of care regarding preparing for this visit, review of most recent office visit notes, review of multiple chronic medical conditions under management, review of most recent blood work results including interpretation of today's hemoglobin A1c, review of all medications, cardiovascular risks associated with hypertension and diabetes, education on nutrition, review of health maintenance items, prognosis, documentation, and need for follow-up.  Problem List Items Addressed This Visit       Cardiovascular and Mediastinum   Hypertension associated with diabetes (HCC) - Primary    BP Readings from Last 3 Encounters:  04/07/23 130/80  02/10/23 128/72  10/07/22 131/78  Well-controlled hypertension Continue lisinopril 20 mg daily Well-controlled diabetes with hemoglobin A1c of 6.0 Continue metformin 500 mg twice a day Cardiovascular risks associated with hypertension and diabetes discussed Diet and nutrition discussed Follow-up in 6 months       Relevant Medications   metFORMIN (GLUCOPHAGE) 500 MG tablet   Other Relevant Orders   POCT HgB A1C   Urine Microalbumin w/creat. ratio   CBC  with Differential/Platelet   Comprehensive metabolic panel   Lipid panel     Endocrine   Dyslipidemia associated with type 2 diabetes mellitus (HCC)    Chronic stable condition Hemoglobin A1c is 6.0 Continue metformin 500 mg twice a day and rosuvastatin 10 mg daily Lipid profile and CMP done today Diet and nutrition discussed The 10-year ASCVD risk score (Arnett DK, et al., 2019) is: 7.5%   Values used to calculate the score:     Age: 18 years     Sex: Male     Is Non-Hispanic African American: No     Diabetic: Yes     Tobacco smoker: No     Systolic Blood Pressure: 130 mmHg     Is BP treated: Yes     HDL Cholesterol: 39.2 mg/dL     Total Cholesterol: 172 mg/dL       Relevant Medications   metFORMIN (GLUCOPHAGE) 500 MG tablet   Other Relevant Orders   Urine Microalbumin w/creat. ratio   Comprehensive metabolic panel   Lipid panel     Other   Morbid obesity (HCC)    Wt Readings from Last 3 Encounters:  04/07/23 255 lb 4 oz (115.8 kg)  02/10/23 259 lb (117.5 kg)  10/07/22 259 lb 12.8 oz (117.8 kg)  Diet and nutrition discussed Benefits of exercise discussed Advised to decrease amount of daily carbohydrate intake and daily calories and increase amount of plant based protein in his diet       Relevant Medications   metFORMIN (GLUCOPHAGE) 500 MG tablet   Patient Instructions  Mantenimiento de Radiographer, therapeutic en los hombres Health Maintenance, Male Adoptar un estilo de vida saludable y recibir atencin preventiva son importantes para promover la salud y Counsellor. Consulte al mdico sobre: El esquema adecuado para hacerse pruebas y exmenes peridicos. Cosas que puede hacer por su cuenta para prevenir enfermedades y [place] sano. Qu debo saber sobre la dieta, el peso y el ejercicio? Consuma una  dieta saludable  Consuma una dieta que incluya muchas verduras, frutas, productos lcteos con bajo contenido de [place] y Associate Professor. No consuma muchos alimentos ricos  en grasas slidas, azcares agregados o sodio. Mantenga un peso saludable El ndice de masa muscular [facility]) es una medida que puede utilizarse para identificar posibles problemas de [place]. Proporciona una estimacin de la grasa corporal basndose en el peso y la altura. Su mdico puede ayudarle a Engineer, site [facility] y a Personnel officer o Pharmacologist un peso saludable. Haga ejercicio con regularidad Haga ejercicio con regularidad. Esta es una de las prcticas ms importantes que puede hacer por su salud. La [employer] de los adultos deben seguir estas pautas: Education officer, environmental, al menos, 150 minutos de actividad fsica por semana. El ejercicio debe aumentar la frecuencia cardaca y Media planner transpirar (ejercicio de intensidad moderada). Hacer ejercicios de fortalecimiento por lo [employer] por semana. Agregue esto a su plan de ejercicio de intensidad moderada. Pase menos tiempo sentado. Incluso la actividad fsica ligera puede ser beneficiosa. Controle sus niveles de colesterol y lpidos en la sangre Comience a realizarse anlisis de lpidos y Oncologist en la sangre a los 20 aos y luego reptalos cada 5 aos. Es posible que Insurance underwriter los niveles de colesterol con mayor frecuencia si: Sus niveles de lpidos y colesterol son altos. Es mayor de 40 aos. Presenta un alto riesgo de padecer enfermedades cardacas. Qu debo saber sobre las pruebas de deteccin del cncer? Muchos tipos de cncer pueden detectarse de manera temprana y, a menudo, pueden prevenirse. Segn su historia clnica y sus antecedentes familiares, es posible que deba realizarse pruebas de deteccin del cncer en diferentes edades. Esto puede incluir pruebas de deteccin de lo siguiente: Building services engineer. Cncer de prstata. Cncer de piel. Cncer de pulmn. Qu debo saber sobre la enfermedad cardaca, la diabetes y la hipertensin arterial? Presin arterial y enfermedad cardaca La hipertensin arterial causa enfermedades cardacas y  [place] el riesgo de accidente cerebrovascular. Es ms probable que esto se manifieste en las personas que tienen lecturas de presin arterial alta o tienen sobrepeso. Hable con el mdico sobre sus valores de presin arterial deseados. Hgase controlar la presin arterial: Cada 3 a 5 aos si tiene entre 18 y 65 aos. Todos los aos si es mayor de 40 aos. Si tiene entre 65 y 69 aos y es fumador o Insurance underwriter, pregntele al mdico si debe realizarse una prueba de deteccin de aneurisma artico abdominal (AAA) por nica vez. Diabetes Realcese exmenes de deteccin de la diabetes con regularidad. Este anlisis revisa el nivel de azcar en la sangre en [place]. Hgase las pruebas de deteccin: Cada tres aos despus de los 45 aos de edad si tiene un peso normal y un bajo riesgo de padecer diabetes. Con ms frecuencia y a partir de [place] edad inferior si tiene sobrepeso o un alto riesgo de padecer diabetes. Qu debo saber sobre la prevencin de infecciones? Hepatitis B Si tiene un riesgo ms alto de contraer hepatitis B, debe someterse a un examen de deteccin de este virus. Hable con el mdico para averiguar si tiene riesgo de contraer la infeccin por hepatitis B. Hepatitis C Se recomienda un anlisis de [place] para: Todos los que nacieron entre 1947 y [phone]. Todas las personas que tengan un riesgo de haber contrado hepatitis C. Enfermedades de transmisin sexual (ETS) Debe realizarse pruebas de deteccin de ITS todos los aos, incluidas la gonorrea y la clamidia, si: Es sexualmente activo y es menor de [address]. Es  mayor de [address], y Public affairs consultant informa que corre riesgo de tener este tipo de infecciones. La actividad sexual ha cambiado desde que le hicieron la ltima prueba de deteccin y tiene un riesgo mayor de Warehouse manager clamidia o Copy. Pregntele al mdico si usted tiene riesgo. Pregntele al mdico si usted tiene un alto riesgo de Primary school teacher VIH. El mdico tambin puede recomendarle un  medicamento recetado para ayudar a evitar la infeccin por el VIH. Si elige tomar medicamentos para prevenir el VIH, primero debe [employer] de deteccin del VIH. Luego debe hacerse anlisis cada 3 meses mientras est tomando los medicamentos. Siga estas indicaciones en su casa: Consumo de alcohol No beba alcohol si el mdico se lo prohbe. Si bebe alcohol: Limite la cantidad que consume de 0 a 2 bebidas por da. Sepa cunta cantidad de alcohol hay en las bebidas que toma. En los [address], una medida equivale a una botella de cerveza de 12 oz (355 ml), un vaso de vino de 5 oz (148 ml) o un vaso de una bebida alcohlica de alta graduacin de 1 oz (44 ml). Estilo de vida No consuma ningn producto que contenga nicotina o tabaco. Estos productos incluyen cigarrillos, tabaco para Theatre manager y aparatos de vapeo, como los Administrator, Civil Service. Si necesita ayuda para dejar de consumir estos productos, consulte al mdico. No consuma drogas. No comparta agujas. Solicite ayuda a su mdico si necesita apoyo o informacin para abandonar las drogas. Indicaciones generales Realcese los estudios de rutina de [address], dentales y de Wellsite geologist. Mantngase al da con las vacunas. Infrmele a su mdico si: Se siente deprimido con frecuencia. Alguna vez ha sido vctima de [place] o no se siente seguro en su casa. Resumen Adoptar un estilo de vida saludable y recibir atencin preventiva son importantes para promover la salud y Counsellor. Siga las instrucciones del mdico acerca de una dieta saludable, el ejercicio y la realizacin de pruebas o exmenes para Hotel manager. Siga las instrucciones del mdico con respecto al control del colesterol y la presin arterial. Esta informacin no tiene Theme park manager el consejo del mdico. Asegrese de hacerle al mdico cualquier pregunta que tenga. Document Revised: 12/17/2020 Document Reviewed: 12/17/2020 Elsevier Patient Education  2024  Elsevier Inc.     Edwina Barth, MD [facility]

## 2023-04-07 NOTE — Assessment & Plan Note (Signed)
Wt Readings from Last 3 Encounters:  04/07/23 255 lb 4 oz (115.8 kg)  02/10/23 259 lb (117.5 kg)  10/07/22 259 lb 12.8 oz (117.8 kg)  Diet and nutrition discussed Benefits of exercise discussed Advised to decrease amount of daily carbohydrate intake and daily calories and increase amount of plant based protein in his diet

## 2023-04-07 NOTE — Assessment & Plan Note (Signed)
Chronic stable condition Hemoglobin A1c is 6.0 Continue metformin 500 mg twice a day and rosuvastatin 10 mg daily Lipid profile and CMP done today Diet and nutrition discussed The 10-year ASCVD risk score (Arnett DK, et al., 2019) is: 7.5%   Values used to calculate the score:     Age: 47 years     Sex: Male     Is Non-Hispanic African American: No     Diabetic: Yes     Tobacco smoker: No     Systolic Blood Pressure: 130 mmHg     Is BP treated: Yes     HDL Cholesterol: 39.2 mg/dL     Total Cholesterol: 172 mg/dL

## 2023-04-12 ENCOUNTER — Telehealth: Payer: Self-pay | Admitting: Neurology

## 2023-04-12 NOTE — Telephone Encounter (Signed)
Received the ONO on CPAP report that the pt completed on 04/04/23-04/05/23. There was a total recording of 6 hours and 45 min. The lowest the oxygen level dropped was 88%. There was only 17 seconds where the time was spent below 89%. There is no need for oxygen.

## 2023-04-26 ENCOUNTER — Other Ambulatory Visit: Payer: Self-pay | Admitting: Emergency Medicine

## 2023-04-26 DIAGNOSIS — K219 Gastro-esophageal reflux disease without esophagitis: Secondary | ICD-10-CM

## 2023-05-10 ENCOUNTER — Encounter: Payer: Self-pay | Admitting: Emergency Medicine

## 2023-05-10 DIAGNOSIS — E1165 Type 2 diabetes mellitus with hyperglycemia: Secondary | ICD-10-CM

## 2023-05-11 ENCOUNTER — Other Ambulatory Visit: Payer: Self-pay | Admitting: *Deleted

## 2023-05-11 MED ORDER — ACCU-CHEK GUIDE W/DEVICE KIT: PACK | 0 refills | Status: DC

## 2023-05-23 MED ORDER — ACCU-CHEK FASTCLIX LANCETS MISC: 3 refills | Status: AC

## 2023-05-23 MED ORDER — ACCU-CHEK GUIDE ME W/DEVICE KIT: PACK | 0 refills | Status: DC

## 2023-05-23 MED ORDER — ACCU-CHEK GUIDE VI STRP
ORAL_STRIP | 5 refills | Status: AC
Start: 2023-05-23 — End: ?

## 2023-07-23 DIAGNOSIS — E119 Type 2 diabetes mellitus without complications: Secondary | ICD-10-CM | POA: Diagnosis not present

## 2023-07-23 LAB — HM DIABETES EYE EXAM

## 2023-08-01 ENCOUNTER — Other Ambulatory Visit: Payer: Self-pay | Admitting: Emergency Medicine

## 2023-08-01 DIAGNOSIS — K219 Gastro-esophageal reflux disease without esophagitis: Secondary | ICD-10-CM

## 2023-09-11 ENCOUNTER — Other Ambulatory Visit: Payer: Self-pay | Admitting: Emergency Medicine

## 2023-09-23 ENCOUNTER — Other Ambulatory Visit: Payer: Self-pay | Admitting: Emergency Medicine

## 2023-09-23 DIAGNOSIS — J301 Allergic rhinitis due to pollen: Secondary | ICD-10-CM

## 2023-10-06 ENCOUNTER — Ambulatory Visit (INDEPENDENT_AMBULATORY_CARE_PROVIDER_SITE_OTHER): Admitting: Podiatry

## 2023-10-06 ENCOUNTER — Encounter: Payer: Self-pay | Admitting: Podiatry

## 2023-10-06 ENCOUNTER — Ambulatory Visit (INDEPENDENT_AMBULATORY_CARE_PROVIDER_SITE_OTHER)

## 2023-10-06 DIAGNOSIS — M722 Plantar fascial fibromatosis: Secondary | ICD-10-CM

## 2023-10-06 MED ORDER — TRIAMCINOLONE ACETONIDE 10 MG/ML IJ SUSP
10.0000 mg | Freq: Once | INTRAMUSCULAR | Status: AC
  Administered 2023-10-06: 10 mg via INTRA_ARTICULAR

## 2023-10-07 NOTE — Progress Notes (Signed)
 Subjective:   Patient ID: Dominic [name], male   DOB: 48 y.o.   MRN: [id number]   HPI Patient states he has a lot of pain in the bottom of his left heel and he had orthotics that are 50 years old and no longer providing him good support   ROS      Objective:  Physical Exam  Neurovascular status intact with exquisite discomfort medial band left plantar fascia at insertion calcaneus with flatfoot deformity     Assessment:  Acute plantar fasciitis left with flatfoot deformity     Plan:  H&P x-ray taken and due to excellent use which orthotics that have worn out pedorthist casted for new orthotic devices.  Sterile prep injected the medial band at insertion 3 mg Kenalog 5 mg Xylocaine and applied sterile dressing  X-rays indicate spur formation no indication stress fracture arthritis diminishment of arch height on weightbearing lateral view

## 2023-11-17 ENCOUNTER — Other Ambulatory Visit: Payer: Self-pay | Admitting: Emergency Medicine

## 2023-11-17 DIAGNOSIS — E1159 Type 2 diabetes mellitus with other circulatory complications: Secondary | ICD-10-CM

## 2023-11-21 ENCOUNTER — Other Ambulatory Visit

## 2023-12-15 ENCOUNTER — Other Ambulatory Visit: Payer: Self-pay | Admitting: Emergency Medicine

## 2023-12-15 DIAGNOSIS — J301 Allergic rhinitis due to pollen: Secondary | ICD-10-CM

## 2023-12-16 ENCOUNTER — Ambulatory Visit (INDEPENDENT_AMBULATORY_CARE_PROVIDER_SITE_OTHER): Admitting: Podiatry

## 2023-12-16 ENCOUNTER — Ambulatory Visit (INDEPENDENT_AMBULATORY_CARE_PROVIDER_SITE_OTHER)

## 2023-12-16 ENCOUNTER — Ambulatory Visit

## 2023-12-16 ENCOUNTER — Ambulatory Visit: Admitting: Podiatry

## 2023-12-16 DIAGNOSIS — M7732 Calcaneal spur, left foot: Secondary | ICD-10-CM

## 2023-12-16 DIAGNOSIS — M7752 Other enthesopathy of left foot: Secondary | ICD-10-CM

## 2023-12-16 DIAGNOSIS — M7751 Other enthesopathy of right foot: Secondary | ICD-10-CM

## 2023-12-16 DIAGNOSIS — M722 Plantar fascial fibromatosis: Secondary | ICD-10-CM | POA: Diagnosis not present

## 2023-12-16 MED ORDER — MELOXICAM 15 MG PO TABS: 15.0000 mg | ORAL_TABLET | Freq: Every day | ORAL | 0 refills | Status: DC | PRN

## 2023-12-16 NOTE — Progress Notes (Unsigned)
Patient presents today to pick up custom molded foot orthotics, diagnosed with PF by Dr. Charlsie Merles.   Orthotics were dispensed and fit was satisfactory. Reviewed instructions for break-in and wear. Written instructions given to patient.  Patient will follow up as needed.   Addison Bailey Cped, CFo, CFm

## 2023-12-16 NOTE — Progress Notes (Unsigned)
 Subjective: Chief Complaint  Patient presents with   Foot Pain    RM#11 Patient presents today with left heel pain when wearing shoes doesn't hurt as much but when patient is in bed pain get worse.   48 year old male presents the office today for follow-up evaluation of left heel pain.  Last seen by Dr. Celia Coles in March for the same issue.  He states that while the heel is doing better he still having discomfort.  He does get pain after rest particularly after laying in bed.  He is not reporting recent injuries.  No changes.   Objective: AAO x3, NAD DP/PT pulses palpable bilaterally, CRT less than 3 seconds The majority tenderness today is on the plantar aspect the left heel insertion of the plantar fascia.  There is no pain with lateral compression of the calcaneus.  There is no edema, erythema.  No pain in the Achilles tendon.  Flexor, extensor tendons appear to be intact.  MMT 5/5. No pain with calf compression, swelling, warmth, erythema  Assessment: Plantar fasciitis, heel pain  Plan: -All treatment options discussed with the patient including all alternatives, risks, complications.  -X-rays obtained reviewed.  Multiple views obtained.  Minimal calcaneal spurring noted inferiorly.  There is no evidence of acute fracture noted. -We discussed repeat injection but he wishes to hold off on this.  Prescribed meloxicam  to take as needed discussed side effects. -Night splint dispensed. Static or dynamic ankle foot orthosis, including soft interface material, adjustable for fit, for positioning, may be used for minimal ambulation, prefabricated, off-the-shelf was dispensed on the billed date of service  -Discussed stretching, icing. -Patient encouraged to call the office with any questions, concerns, change in symptoms.   Charity Conch DPM

## 2023-12-16 NOTE — Patient Instructions (Signed)
 For instructions on how to put on your Night Splint, please visit [email or web address]   Plantar Fasciitis (Heel Spur Syndrome) with Rehab The plantar fascia is a fibrous, ligament-like, soft-tissue structure that spans the bottom of the foot. Plantar fasciitis is a condition that causes pain in the foot due to inflammation of the tissue. SYMPTOMS   Pain and tenderness on the underneath side of the foot.  Pain that worsens with standing or walking. CAUSES  Plantar fasciitis is caused by irritation and injury to the plantar fascia on the underneath side of the foot. Common mechanisms of injury include:  Direct trauma to bottom of the foot.  Damage to a small nerve that runs under the foot where the main fascia attaches to the heel bone.  Stress placed on the plantar fascia due to bone spurs. RISK INCREASES WITH:   Activities that place stress on the plantar fascia (running, jumping, pivoting, or cutting).  Poor strength and flexibility.  Improperly fitted shoes.  Tight calf muscles.  Flat feet.  Failure to warm-up properly before activity.  Obesity. PREVENTION  Warm up and stretch properly before activity.  Allow for adequate recovery between workouts.  Maintain physical fitness:  Strength, flexibility, and endurance.  Cardiovascular fitness.  Maintain a health body weight.  Avoid stress on the plantar fascia.  Wear properly fitted shoes, including arch supports for individuals who have flat feet.  PROGNOSIS  If treated properly, then the symptoms of plantar fasciitis usually resolve without surgery. However, occasionally surgery is necessary.  RELATED COMPLICATIONS   Recurrent symptoms that may result in a chronic condition.  Problems of the lower back that are caused by compensating for the injury, such as limping.  Pain or weakness of the foot during push-off following surgery.  Chronic inflammation, scarring, and partial or complete fascia tear,  occurring more often from repeated injections.  TREATMENT  Treatment initially involves the use of ice and medication to help reduce pain and inflammation. The use of strengthening and stretching exercises may help reduce pain with activity, especially stretches of the Achilles tendon. These exercises may be performed at home or with a therapist. Your caregiver may recommend that you use heel cups of arch supports to help reduce stress on the plantar fascia. Occasionally, corticosteroid injections are given to reduce inflammation. If symptoms persist for greater than 6 months despite non-surgical (conservative), then surgery may be recommended.   MEDICATION   If pain medication is necessary, then nonsteroidal anti-inflammatory medications, such as aspirin and ibuprofen, or other minor pain relievers, such as acetaminophen, are often recommended.  Do not take pain medication within 7 days before surgery.  Prescription pain relievers may be given if deemed necessary by your caregiver. Use only as directed and only as much as you need.  Corticosteroid injections may be given by your caregiver. These injections should be reserved for the most serious cases, because they may only be given a certain number of times.  HEAT AND COLD  Cold treatment (icing) relieves pain and reduces inflammation. Cold treatment should be applied for 10 to 15 minutes every 2 to 3 hours for inflammation and pain and immediately after any activity that aggravates your symptoms. Use ice packs or massage the area with a piece of ice (ice massage).  Heat treatment may be used prior to performing the stretching and strengthening activities prescribed by your caregiver, physical therapist, or athletic trainer. Use a heat pack or soak the injury in warm water.  SEEK IMMEDIATE MEDICAL CARE  IF:  Treatment seems to offer no benefit, or the condition worsens.  Any medications produce adverse side effects.  EXERCISES- RANGE OF  MOTION (ROM) AND STRETCHING EXERCISES - Plantar Fasciitis (Heel Spur Syndrome) These exercises may help you when beginning to rehabilitate your injury. Your symptoms may resolve with or without further involvement from your physician, physical therapist or athletic trainer. While completing these exercises, remember:   Restoring tissue flexibility helps normal motion to return to the joints. This allows healthier, less painful movement and activity.  An effective stretch should be held for at least 30 seconds.  A stretch should never be painful. You should only feel a gentle lengthening or release in the stretched tissue.  RANGE OF MOTION - Toe Extension, Flexion  Sit with your right / left leg crossed over your opposite knee.  Grasp your toes and gently pull them back toward the top of your foot. You should feel a stretch on the bottom of your toes and/or foot.  Hold this stretch for 10 seconds.  Now, gently pull your toes toward the bottom of your foot. You should feel a stretch on the top of your toes and or foot.  Hold this stretch for 10 seconds. Repeat  times. Complete this stretch 3 times per day.   RANGE OF MOTION - Ankle Dorsiflexion, Active Assisted  Remove shoes and sit on a chair that is preferably not on a carpeted surface.  Place right / left foot under knee. Extend your opposite leg for support.  Keeping your heel down, slide your right / left foot back toward the chair until you feel a stretch at your ankle or calf. If you do not feel a stretch, slide your bottom forward to the edge of the chair, while still keeping your heel down.  Hold this stretch for 10 seconds. Repeat 3 times. Complete this stretch 2 times per day.   STRETCH  Gastroc, Standing  Place hands on wall.  Extend right / left leg, keeping the front knee somewhat bent.  Slightly point your toes inward on your back foot.  Keeping your right / left heel on the floor and your knee straight, shift  your weight toward the wall, not allowing your back to arch.  You should feel a gentle stretch in the right / left calf. Hold this position for 10 seconds. Repeat 3 times. Complete this stretch 2 times per day.  STRETCH  Soleus, Standing  Place hands on wall.  Extend right / left leg, keeping the other knee somewhat bent.  Slightly point your toes inward on your back foot.  Keep your right / left heel on the floor, bend your back knee, and slightly shift your weight over the back leg so that you feel a gentle stretch deep in your back calf.  Hold this position for 10 seconds. Repeat 3 times. Complete this stretch 2 times per day.  STRETCH  Gastrocsoleus, Standing  Note: This exercise can place a lot of stress on your foot and ankle. Please complete this exercise only if specifically instructed by your caregiver.   Place the ball of your right / left foot on a step, keeping your other foot firmly on the same step.  Hold on to the wall or a rail for balance.  Slowly lift your other foot, allowing your body weight to press your heel down over the edge of the step.  You should feel a stretch in your right / left calf.  Hold this position  for 10 seconds.  Repeat this exercise with a slight bend in your right / left knee. Repeat 3 times. Complete this stretch 2 times per day.   STRENGTHENING EXERCISES - Plantar Fasciitis (Heel Spur Syndrome)  These exercises may help you when beginning to rehabilitate your injury. They may resolve your symptoms with or without further involvement from your physician, physical therapist or athletic trainer. While completing these exercises, remember:   Muscles can gain both the endurance and the strength needed for everyday activities through controlled exercises.  Complete these exercises as instructed by your physician, physical therapist or athletic trainer. Progress the resistance and repetitions only as guided.  STRENGTH - Towel Curls  Sit in  a chair positioned on a non-carpeted surface.  Place your foot on a towel, keeping your heel on the floor.  Pull the towel toward your heel by only curling your toes. Keep your heel on the floor. Repeat 3 times. Complete this exercise 2 times per day.  STRENGTH - Ankle Inversion  Secure one end of a rubber exercise band/tubing to a fixed object (table, pole). Loop the other end around your foot just before your toes.  Place your fists between your knees. This will focus your strengthening at your ankle.  Slowly, pull your big toe up and in, making sure the band/tubing is positioned to resist the entire motion.  Hold this position for 10 seconds.  Have your muscles resist the band/tubing as it slowly pulls your foot back to the starting position. Repeat 3 times. Complete this exercises 2 times per day.  Document Released: 07/12/2005 Document Revised: 10/04/2011 Document Reviewed: 10/24/2008 [facility] Patient Information 2014 [place], [place].

## 2024-01-30 ENCOUNTER — Encounter: Payer: Self-pay | Admitting: Podiatry

## 2024-01-30 ENCOUNTER — Ambulatory Visit: Admitting: Emergency Medicine

## 2024-01-30 ENCOUNTER — Ambulatory Visit: Admitting: Podiatry

## 2024-01-30 DIAGNOSIS — M722 Plantar fascial fibromatosis: Secondary | ICD-10-CM

## 2024-01-30 MED ORDER — TRIAMCINOLONE ACETONIDE 10 MG/ML IJ SUSP
10.0000 mg | Freq: Once | INTRAMUSCULAR | Status: AC
Start: 2024-01-30 — End: 2024-01-30
  Administered 2024-01-30: 10 mg

## 2024-01-30 NOTE — Progress Notes (Signed)
 Patient presents today with complaint of plantar heel left.  More in the lateral but on the medial side.  Does not bother him for several weeks and getting worse.  He does not recall any injury to the area.   Physical exam:  General appearance: Pleasant, and in no acute distress. AOx3.  Vascular: Pedal pulses: DP 2/4 bilaterally, PT 2 to/4 bilaterally.  Mild edema lower legs bilaterally. Capillary fill time immediate bilaterally.  Neurological: Negative Tinel's sign tarsal tunnel and porta pedis left negative Tinel's sign sural nerve left  Dermatologic:   Skin normal temperature bilaterally.  Skin normal color, tone, and texture bilaterally.   Musculoskeletal: Tenderness plantar lateral calcaneal tubercle along the plantar fascia left.  No tenderness lotta compression of the calcaneus.  No tenderness along the arch of the foot.    Diagnosis: 1.  Plantar fasciitis left  Plan: -Discussed with him plantar fasciitis.  Recommended icing 20 minutes an hour as needed.  Discussed proper shoes.  -injected 3cc 2:1 mixture 0.5 cc Marcaine :Kenolog 10mg /55ml at plantar lateral origin of plantar fascia left  - Return 2 weeks follow-up injection plantar fascia left.

## 2024-02-10 NOTE — Progress Notes (Deleted)
 [facility] [address] [place]. [place] [address] [phone]       OFFICE FOLLOW UP NOTE  Mr. CHICO CAWOOD Date of Birth:  07-20-1976 Medical Record Number:  [id number]    Primary neurologist: Dr. Chalice Reason for visit: CPAP follow-up    SUBJECTIVE:   CHIEF COMPLAINT:  No chief complaint on file.   Follow-up visit:  Prior visit: 02/10/2019 for Dr. Chalice  Brief HPI:   JOVE BEYL is a 48 y.o. male who is followed for OSA on CPAP initially diagnosed in 2017.  Repeat sleep study 10/2022 still with severe degree of sleep apnea with total AHI of 81.8/h and over 18 minutes of oxygen desaturation under 90% and received new AutoPap machine 11/2022.   At prior visit with Dr. Chalice, compliance report showed optimal usage at 77% and optimal residual AHI. Continues same pressure settings, recommended completion of  ONO to assess for resolution of hypoxia.  ESS 5/24, previously 11/24.    Interval history:  Patient returns for yearly CPAP follow-up visit.      ROS:   14 system review of systems performed and negative with exception of those listed in HPI  PMH:  Past Medical History:  Diagnosis Date   Allergy    Diabetes mellitus without complication (HCC)    GERD (gastroesophageal reflux disease)    Hernia    Hyperlipidemia    Hypertension     PSH:  Past Surgical History:  Procedure Laterality Date   GROIN DISSECTION  11/19/2011   Procedure: GROIN EXPLORATION;  Surgeon: Vicenta DELENA Poli, MD;  Location: [facility];  Service: General;  Laterality: Right;  Right Groin Exploration   INGUINAL HERNIA REPAIR  2012   right   INGUINAL HERNIA REPAIR  11/19/2011   Procedure: HERNIA REPAIR INGUINAL ADULT;  Surgeon: Vicenta DELENA Poli, MD;  Location: [facility];  Service: General;  Laterality: Right;  excision of right groin mass    Social History:  Social History   Socioeconomic History   Marital status: Married    Spouse name: Not on file    Number of children: Not on file   Years of education: Not on file   Highest education level: Not on file  Occupational History    Comment: Demolition work  Tobacco Use   Smoking status: Former    Current packs/day: 0.25    Average packs/day: 0.3 packs/day for 3.0 years (0.8 ttl pk-yrs)    Types: Cigarettes   Smokeless tobacco: Never  Vaping Use   Vaping status: Never Used  Substance and Sexual Activity   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      Children:  4 children; no grandchildren      Tobacco: none   Social Drivers of Corporate investment banker Strain: Not on file  Food Insecurity: Not on file  Transportation Needs: Not on file  Physical Activity: Not on file  Stress: Not on file  Social Connections: Not on file  Intimate Partner Violence: Not on file    Family History:  Family History  Problem Relation Age of Onset   Hypertension Mother    Diabetes Mother    Diabetes Father    Colon cancer Neg Hx    Colon polyps  Neg Hx    Esophageal cancer Neg Hx    Rectal cancer Neg Hx    Stomach cancer Neg Hx     Medications:   Current Outpatient Medications on File Prior to Visit  Medication Sig Dispense Refill   Accu-Chek FastClix Lancets MISC USE TO TEST BLOOD SUGAR UP TO 4 TIMES A DAY 200 each 3   ACCU-CHEK GUIDE test strip USE TO TEST BLOOD SUGAR UP TO 4 TIMES A DAY 200 each 5   blood glucose meter kit and supplies KIT Dispense based on patient and insurance preference. Use up to four times daily as directed. (FOR ICD-9 250.00, 250.01). 1 each 0   Blood Glucose Monitoring Suppl (ACCU-CHEK GUIDE ME) w/Device KIT AS DIRECTED TO CHECK BLOOD SUGAR EVERY DAY 1 kit 3   clotrimazole -betamethasone  (LOTRISONE ) cream APPLY TOPICALLY TO THE AFFECTED AREA TWICE DAILY 45 g 2   Continuous Blood Gluc  Receiver (FREESTYLE LIBRE READER) DEVI Use to check blood sugars 1 each 0   diclofenac  (VOLTAREN ) 75 MG EC tablet Take 1 tablet (75 mg total) by mouth 2 (two) times daily. 50 tablet 2   fluticasone  (FLONASE ) 50 MCG/ACT nasal spray USE ONE SPRAY IN EACH NOSTRIL AS NEEDED 48 g 1   lisinopril  (ZESTRIL ) 20 MG tablet TAKE 1 TABLET(20 MG) BY MOUTH DAILY 90 tablet 3   loratadine  (CLARITIN ) 10 MG tablet TAKE 1 TABLET(10 MG) BY MOUTH DAILY 90 tablet 1   meloxicam  (MOBIC ) 15 MG tablet Take 1 tablet (15 mg total) by mouth daily as needed for pain. 30 tablet 0   metFORMIN  (GLUCOPHAGE ) 500 MG tablet Take 1 tablet (500 mg total) by mouth 2 (two) times daily with a meal. 180 tablet 3   omeprazole  (PRILOSEC) 20 MG capsule TAKE 1 CAPSULE(20 MG) BY MOUTH DAILY 90 capsule 0   rosuvastatin  (CRESTOR ) 10 MG tablet TAKE 1 TABLET(10 MG) BY MOUTH DAILY 90 tablet 3   No current facility-administered medications on file prior to visit.    Allergies:  No Known Allergies    OBJECTIVE:  Physical Exam  There were no vitals filed for this visit. There is no height or weight on file to calculate BMI. No results found.   General: well developed, well nourished, seated, in no evident distress Head: head normocephalic and atraumatic.   Neck: supple with no carotid or supraclavicular bruits Cardiovascular: regular rate and rhythm, no murmurs  Neurologic Exam Mental Status: Awake and fully alert. Oriented to place and time. Recent and remote memory intact. Attention span, concentration and fund of knowledge appropriate. Mood and affect appropriate.  Cranial Nerves: Pupils equal, briskly reactive to light. Extraocular movements full without nystagmus. Visual fields full to confrontation. Hearing intact. Facial sensation intact. Face, tongue, palate moves normally and symmetrically.  Motor: Normal bulk and tone. Normal strength in all tested extremity muscles Gait and Station: Arises from chair without difficulty. Stance  is normal. Gait demonstrates normal stride length and balance without use of AD.         ASSESSMENT/PLAN: ROSALIE GELPI is a 48 y.o. year old male    OSA on CPAP :  Compliance report shows suboptimal usage *** Continue current pressure settings of 5-20 with EPR 1 Discussed importance of nightly usage with ensuring greater than 4 hours nightly for optimal benefit and per insurance purposes.   DME [facility], continue to follow with DME company for any needed supplies or CPAP related concerns CPAP set up 11/2022     Follow up in ***  or call earlier if needed   CC:  PCP: Purcell Emil Schanz, MD    I personally spent a total of *** minutes in the care of the patient today including {Time Based Coding:[id number]}.     Harlene Bogaert, AGNP-BC  [facility] [address] [place], [place] [address]  Phone [phone] Fax [phone] Note: This document was prepared with digital dictation and possible smart phrase technology. Any transcriptional errors that result from this process are unintentional.

## 2024-02-13 ENCOUNTER — Encounter: Payer: Self-pay | Admitting: Adult Health

## 2024-02-13 ENCOUNTER — Ambulatory Visit: Payer: BC Managed Care – PPO | Admitting: Adult Health

## 2024-02-17 ENCOUNTER — Ambulatory Visit: Admitting: Podiatry

## 2024-03-12 ENCOUNTER — Ambulatory Visit: Payer: Self-pay | Admitting: Emergency Medicine

## 2024-03-12 ENCOUNTER — Ambulatory Visit: Admitting: Emergency Medicine

## 2024-03-12 ENCOUNTER — Encounter: Payer: Self-pay | Admitting: Emergency Medicine

## 2024-03-12 VITALS — BP 138/96 | HR 68 | Temp 97.9°F | Ht 72.0 in | Wt 251.0 lb

## 2024-03-12 DIAGNOSIS — Z7984 Long term (current) use of oral hypoglycemic drugs: Secondary | ICD-10-CM

## 2024-03-12 DIAGNOSIS — G4733 Obstructive sleep apnea (adult) (pediatric): Secondary | ICD-10-CM | POA: Diagnosis not present

## 2024-03-12 DIAGNOSIS — E1159 Type 2 diabetes mellitus with other circulatory complications: Secondary | ICD-10-CM | POA: Diagnosis not present

## 2024-03-12 DIAGNOSIS — E785 Hyperlipidemia, unspecified: Secondary | ICD-10-CM | POA: Diagnosis not present

## 2024-03-12 DIAGNOSIS — I152 Hypertension secondary to endocrine disorders: Secondary | ICD-10-CM | POA: Diagnosis not present

## 2024-03-12 DIAGNOSIS — Z6834 Body mass index (BMI) 34.0-34.9, adult: Secondary | ICD-10-CM

## 2024-03-12 DIAGNOSIS — E1169 Type 2 diabetes mellitus with other specified complication: Secondary | ICD-10-CM | POA: Diagnosis not present

## 2024-03-12 LAB — CBC WITH DIFFERENTIAL/PLATELET
Basophils Absolute: 0 K/uL (ref 0.0–0.1)
Basophils Relative: 0.7 % (ref 0.0–3.0)
Eosinophils Absolute: 0.2 K/uL (ref 0.0–0.7)
Eosinophils Relative: 3.3 % (ref 0.0–5.0)
HCT: 46.7 % (ref 39.0–52.0)
Hemoglobin: 16 g/dL (ref 13.0–17.0)
Lymphocytes Relative: 27.4 % (ref 12.0–46.0)
Lymphs Abs: 1.8 K/uL (ref 0.7–4.0)
MCHC: 34.2 g/dL (ref 30.0–36.0)
MCV: 89.8 fl (ref 78.0–100.0)
Monocytes Absolute: 0.4 K/uL (ref 0.1–1.0)
Monocytes Relative: 6.5 % (ref 3.0–12.0)
Neutro Abs: 4.1 K/uL (ref 1.4–7.7)
Neutrophils Relative %: 62.1 % (ref 43.0–77.0)
Platelets: 168 K/uL (ref 150.0–400.0)
RBC: 5.2 Mil/uL (ref 4.22–5.81)
RDW: 13.2 % (ref 11.5–15.5)
WBC: 6.5 K/uL (ref 4.0–10.5)

## 2024-03-12 LAB — LIPID PANEL
Cholesterol: 167 mg/dL (ref 0–200)
HDL: 34.3 mg/dL — ABNORMAL LOW (ref >39.00)
LDL Cholesterol: 98 mg/dL (ref 0–99)
NonHDL: 132.51
Total CHOL/HDL Ratio: 5
Triglycerides: 174 mg/dL — ABNORMAL HIGH (ref 0.0–149.0)
VLDL: 34.8 mg/dL (ref 0.0–40.0)

## 2024-03-12 LAB — COMPREHENSIVE METABOLIC PANEL WITH GFR
ALT: 38 U/L (ref 0–53)
AST: 24 U/L (ref 0–37)
Albumin: 4.3 g/dL (ref 3.5–5.2)
Alkaline Phosphatase: 67 U/L (ref 39–117)
BUN: 17 mg/dL (ref 6–23)
CO2: 25 meq/L (ref 19–32)
Calcium: 8.8 mg/dL (ref 8.4–10.5)
Chloride: 104 meq/L (ref 96–112)
Creatinine, Ser: 0.65 mg/dL (ref 0.40–1.50)
GFR: 110.16 mL/min (ref >60.00)
Glucose, Bld: 131 mg/dL — ABNORMAL HIGH (ref 70–99)
Potassium: 3.7 meq/L (ref 3.5–5.1)
Sodium: 138 meq/L (ref 135–145)
Total Bilirubin: 1.5 mg/dL — ABNORMAL HIGH (ref 0.2–1.2)
Total Protein: 7.4 g/dL (ref 6.0–8.3)

## 2024-03-12 LAB — MICROALBUMIN / CREATININE URINE RATIO
Creatinine,U: 106.4 mg/dL
Microalb Creat Ratio: 8.3 mg/g (ref 0.0–30.0)
Microalb, Ur: 0.9 mg/dL (ref 0.0–1.9)

## 2024-03-12 LAB — POCT GLYCOSYLATED HEMOGLOBIN (HGB A1C): Hemoglobin A1C: 6.1 % — AB (ref 4.0–5.6)

## 2024-03-12 NOTE — Patient Instructions (Signed)
 Mantenimiento de Research officer, political party, Male Adoptar un estilo de vida saludable y recibir atencin preventiva son importantes para promover la salud y Counsellor. Consulte al mdico sobre: El esquema adecuado para hacerse pruebas y exmenes peridicos. Cosas que puede hacer por su cuenta para prevenir enfermedades y [place] sano. Qu debo saber sobre la dieta, el peso y el ejercicio? Consuma una dieta saludable  Consuma una dieta que incluya muchas verduras, frutas, productos lcteos con bajo contenido de [place] y Associate Professor. No consuma muchos alimentos ricos en grasas slidas, azcares agregados o sodio. Mantenga un peso saludable El ndice de masa muscular [facility]) es una medida que puede utilizarse para identificar posibles problemas de [place]. Proporciona una estimacin de la grasa corporal basndose en el peso y la altura. Su mdico puede ayudarle a Engineer, site IMC y a Personnel officer o Pharmacologist un peso saludable. Haga ejercicio con regularidad Haga ejercicio con regularidad. Esta es una de las prcticas ms importantes que puede hacer por su salud. La [employer] de los adultos deben seguir estas pautas: Education officer, environmental, al menos, 150 minutos de actividad fsica por semana. El ejercicio debe aumentar la frecuencia cardaca y Media planner transpirar (ejercicio de intensidad moderada). Hacer ejercicios de fortalecimiento por lo [employer] por semana. Agregue esto a su plan de ejercicio de intensidad moderada. Pase menos tiempo sentado. Incluso la actividad fsica ligera puede ser beneficiosa. Controle sus niveles de colesterol y lpidos en la sangre Comience a realizarse anlisis de lpidos y Oncologist en la sangre a los 20 aos y luego reptalos cada 5 aos. Es posible que Insurance underwriter los niveles de colesterol con mayor frecuencia si: Sus niveles de lpidos y colesterol son altos. Es mayor de 40 aos. Presenta un alto riesgo de padecer enfermedades cardacas. Qu debo  saber sobre las pruebas de deteccin del cncer? Muchos tipos de cncer pueden detectarse de manera temprana y, a menudo, pueden prevenirse. Segn su historia clnica y sus antecedentes familiares, es posible que deba realizarse pruebas de deteccin del cncer en diferentes edades. Esto puede incluir pruebas de deteccin de lo siguiente: Building services engineer. Cncer de prstata. Cncer de piel. Cncer de pulmn. Qu debo saber sobre la enfermedad cardaca, la diabetes y la hipertensin arterial? Presin arterial y enfermedad cardaca La hipertensin arterial causa enfermedades cardacas y [place] el riesgo de accidente cerebrovascular. Es ms probable que esto se manifieste en las personas que tienen lecturas de presin arterial alta o tienen sobrepeso. Hable con el mdico sobre sus valores de presin arterial deseados. Hgase controlar la presin arterial: Cada 3 a 5 aos si tiene entre 18 y 71 aos. Todos los aos si es mayor de 40 aos. Si tiene entre 65 y 26 aos y es fumador o Insurance underwriter, pregntele al mdico si debe realizarse una prueba de deteccin de aneurisma artico abdominal (AAA) por nica vez. Diabetes Realcese exmenes de deteccin de la diabetes con regularidad. Este anlisis revisa el nivel de azcar en la sangre en [place]. Hgase las pruebas de deteccin: Cada tres aos despus de los 45 aos de edad si tiene un peso normal y un bajo riesgo de padecer diabetes. Con ms frecuencia y a partir de [place] edad inferior si tiene sobrepeso o un alto riesgo de padecer diabetes. Qu debo saber sobre la prevencin de infecciones? Hepatitis B Si tiene un riesgo ms alto de contraer hepatitis B, debe someterse a un examen de deteccin de este virus. Hable con el mdico para averiguar si tiene riesgo de  contraer la infeccin por hepatitis B. Hepatitis C Se recomienda un anlisis de Benjamin Perez para: Todos los que nacieron entre 1945 y [phone]. Todas las personas que tengan un riesgo de haber  contrado hepatitis C. Enfermedades de transmisin sexual (ETS) Debe realizarse pruebas de deteccin de ITS todos los aos, incluidas la gonorrea y la clamidia, si: Es sexualmente activo y es menor de [address]. Es mayor de [address], y Public affairs consultant informa que corre riesgo de tener este tipo de infecciones. La actividad sexual ha cambiado desde que le hicieron la ltima prueba de deteccin y tiene un riesgo mayor de Warehouse manager clamidia o Copy. Pregntele al mdico si usted tiene riesgo. Pregntele al mdico si usted tiene un alto riesgo de Primary school teacher VIH. El mdico tambin puede recomendarle un medicamento recetado para ayudar a evitar la infeccin por el VIH. Si elige tomar medicamentos para prevenir el VIH, primero debe [employer] de deteccin del VIH. Luego debe hacerse anlisis cada 3 meses mientras est tomando los medicamentos. Siga estas indicaciones en su casa: Consumo de alcohol No beba alcohol si el mdico se lo prohbe. Si bebe alcohol: Limite la cantidad que consume de 0 a 2 bebidas por da. Sepa cunta cantidad de alcohol hay en las bebidas que toma. En los [address], una medida equivale a una botella de cerveza de 12 oz (355 ml), un vaso de vino de 5 oz (148 ml) o un vaso de una bebida alcohlica de alta graduacin de 1 oz (44 ml). Estilo de vida No consuma ningn producto que contenga nicotina o tabaco. Estos productos incluyen cigarrillos, tabaco para Theatre manager y aparatos de vapeo, como los Administrator, Civil Service. Si necesita ayuda para dejar de consumir estos productos, consulte al mdico. No consuma drogas. No comparta agujas. Solicite ayuda a su mdico si necesita apoyo o informacin para abandonar las drogas. Indicaciones generales Realcese los estudios de rutina de [address], dentales y de Wellsite geologist. Mantngase al da con las vacunas. Infrmele a su mdico si: Se siente deprimido con frecuencia. Alguna vez ha sido vctima de [place] o no se siente seguro en su  casa. Resumen Adoptar un estilo de vida saludable y recibir atencin preventiva son importantes para promover la salud y Counsellor. Siga las instrucciones del mdico acerca de una dieta saludable, el ejercicio y la realizacin de pruebas o exmenes para Hotel manager. Siga las instrucciones del mdico con respecto al control del colesterol y la presin arterial. Esta informacin no tiene Theme park manager el consejo del mdico. Asegrese de hacerle al mdico cualquier pregunta que tenga. Document Revised: 12/17/2020 Document Reviewed: 12/17/2020 Elsevier Patient Education  2024 [employer].

## 2024-03-12 NOTE — Assessment & Plan Note (Signed)
 Chronic stable condition Hemoglobin A1c is 6.1 Continue metformin  500 mg twice a day and rosuvastatin  10 mg daily Lipid profile and CMP done today Diet and nutrition discussed

## 2024-03-12 NOTE — Assessment & Plan Note (Signed)
 BP Readings from Last 3 Encounters:  03/12/24 (!) 138/96  04/07/23 130/80  02/10/23 128/72  Elevated blood pressure reading in the office today but normal readings at home Continue lisinopril  20 mg daily Well-controlled diabetes with hemoglobin A1c of 6.1 Cardiovascular risks associated with diabetes and hypertension discussed Diet and nutrition discussed Blood work and urine today Follow-up in 6 months

## 2024-03-12 NOTE — Assessment & Plan Note (Signed)
 Very compliant with CPAP but last night did not use it and woke up with a headache this morning Headache making his blood pressure go up

## 2024-03-12 NOTE — Progress Notes (Signed)
 Dominic [name] Code 48 y.o.   Chief Complaint  Patient presents with   Diabetes    Patient here for DM f/u. No other concerns     HISTORY OF PRESENT ILLNESS: This is a 48 y.o. male here for follow-up of chronic medical conditions including diabetes, hypertension, and dyslipidemia Overall doing well.  Has no complaints or medical concerns today. Lab Results  Component Value Date   HGBA1C 6.0 (A) 04/07/2023   BP Readings from Last 3 Encounters:  04/07/23 130/80  02/10/23 128/72  10/07/22 131/78   Wt Readings from Last 3 Encounters:  04/07/23 255 lb 4 oz (115.8 kg)  02/10/23 259 lb (117.5 kg)  10/07/22 259 lb 12.8 oz (117.8 kg)     Diabetes Pertinent negatives for hypoglycemia include no dizziness or headaches. Pertinent negatives for diabetes include no chest pain.     Prior to Admission medications   Medication Sig Start Date End Date Taking? Authorizing Provider  Accu-Chek FastClix Lancets MISC USE TO TEST BLOOD SUGAR UP TO 4 TIMES A DAY 05/23/23  Yes Kely Dohn, Emil Schanz, MD  ACCU-CHEK GUIDE test strip USE TO TEST BLOOD SUGAR UP TO 4 TIMES A DAY 05/23/23  Yes Leilah Polimeni, Emil Schanz, MD  blood glucose meter kit and supplies KIT Dispense based on patient and insurance preference. Use up to four times daily as directed. (FOR ICD-9 250.00, 250.01). 07/11/18  Yes Amariah Kierstead, Emil Schanz, MD  Blood Glucose Monitoring Suppl (ACCU-CHEK GUIDE ME) w/Device KIT AS DIRECTED TO CHECK BLOOD SUGAR EVERY DAY 09/12/23  Yes Mandee Pluta, Emil Schanz, MD  clotrimazole -betamethasone  (LOTRISONE ) cream APPLY TOPICALLY TO THE AFFECTED AREA TWICE DAILY 05/20/22  Yes Sharaine Delange, Emil Schanz, MD  fluticasone  (FLONASE ) 50 MCG/ACT nasal spray USE ONE SPRAY IN EACH NOSTRIL AS NEEDED Patient taking differently: Place 2 sprays into both nostrils as needed for allergies. USE ONE SPRAY IN EACH NOSTRIL AS NEEDED 09/24/22  Yes Aayra Hornbaker, Emil Schanz, MD  lisinopril  (ZESTRIL ) 20 MG tablet TAKE 1 TABLET(20 MG) BY MOUTH DAILY  11/18/23  Yes Addie Alonge, Emil Schanz, MD  metFORMIN  (GLUCOPHAGE ) 500 MG tablet Take 1 tablet (500 mg total) by mouth 2 (two) times daily with a meal. 04/07/23 04/01/24 Yes Rodarius Kichline, Emil Schanz, MD  omeprazole  (PRILOSEC) 20 MG capsule TAKE 1 CAPSULE(20 MG) BY MOUTH DAILY 08/01/23  Yes Mirriam Vadala, Emil Schanz, MD  Continuous Blood Gluc Receiver (FREESTYLE LIBRE READER) DEVI Use to check blood sugars Patient not taking: Reported on 03/12/2024 08/12/22   Ryna Beckstrom Osmel, MD  diclofenac  (VOLTAREN ) 75 MG EC tablet Take 1 tablet (75 mg total) by mouth 2 (two) times daily. Patient not taking: Reported on 03/12/2024 01/22/21   Magdalen Pasco RAMAN, DPM  loratadine  (CLARITIN ) 10 MG tablet TAKE 1 TABLET(10 MG) BY MOUTH DAILY 09/23/23   Valencia Kassa Kajuan, MD  meloxicam  (MOBIC ) 15 MG tablet Take 1 tablet (15 mg total) by mouth daily as needed for pain. 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 02/10/2023   Morbid obesity (HCC) 02/10/2023   Chronic intermittent hypoxia with obstructive sleep apnea 10/07/2022   Severe obstructive sleep apnea-hypopnea syndrome 10/07/2022   Dyslipidemia associated with type 2 diabetes mellitus (HCC) 04/21/2018   OSA on CPAP  01/15/2016   Hypertension associated with diabetes (HCC) 09/22/2015   Obesity 05/31/2014    Past Medical History:  Diagnosis Date   Allergy    Diabetes mellitus without complication (HCC)    GERD (gastroesophageal reflux disease)    Hernia    Hyperlipidemia    Hypertension     Past Surgical History:  Procedure Laterality Date   GROIN DISSECTION  11/19/2011   Procedure: GROIN EXPLORATION;  Surgeon: Vicenta [name] Poli, MD;  Location: [facility];  Service:  General;  Laterality: Right;  Right Groin Exploration   INGUINAL HERNIA REPAIR  2012   right   INGUINAL HERNIA REPAIR  11/19/2011   Procedure: HERNIA REPAIR INGUINAL ADULT;  Surgeon: Vicenta [name] Poli, MD;  Location: [facility];  Service: General;  Laterality: Right;  excision of right groin mass    Social History   Socioeconomic History   Marital status: Married    Spouse name: Not on file   Number of children: Not on file   Years of education: Not on file   Highest education level: Not on file  Occupational History    Comment: Demolition work  Tobacco Use   Smoking status: Former    Current packs/day: 0.25    Average packs/day: 0.3 packs/day for 3.0 years (0.8 ttl pk-yrs)    Types: Cigarettes   Smokeless tobacco: Never  Vaping Use   Vaping status: Never Used  Substance and Sexual Activity   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      Children:  4 children; no grandchildren      Tobacco: none   Social Drivers of Corporate investment banker Strain: Not on file  Food Insecurity: Not on file  Transportation Needs: Not on file  Physical Activity: Not on file  Stress: Not on file  Social Connections: Not on file  Intimate Partner Violence: Not on file    Family History  Problem Relation Age of Onset   Hypertension Mother    Diabetes Mother    Diabetes Father    Colon cancer Neg Hx    Colon polyps Neg Hx    Esophageal cancer Neg Hx    Rectal cancer Neg Hx    Stomach cancer Neg Hx      Review of Systems  Constitutional: Negative.  Negative for chills and fever.  HENT: Negative.  Negative for congestion and sore throat.   Respiratory: Negative.  Negative for cough and shortness of breath.   Cardiovascular: Negative.  Negative for chest pain and palpitations.  Gastrointestinal:  Negative for  abdominal pain, diarrhea, nausea and vomiting.  Genitourinary: Negative.  Negative for dysuria and hematuria.  Skin: Negative.  Negative for rash.  Neurological: Negative.  Negative for dizziness and headaches.  All other systems reviewed and are negative.   Today's Vitals   03/12/24 0822  BP: (!) 138/96  Pulse: 68  Temp: 97.9 F (36.6 C)  TempSrc: Oral  SpO2: 98%  Weight: 251 lb (113.9 kg)  Height: 6' (1.829 m)   Body mass index is 34.04 kg/m.   Physical Exam Vitals reviewed.  Constitutional:      Appearance: Normal appearance.  HENT:     Head: Normocephalic.     Right Ear: Tympanic membrane, ear canal and  external ear normal.     Left Ear: Tympanic membrane, ear canal and external ear normal.     Mouth/Throat:     Mouth: Mucous membranes are moist.     Pharynx: Oropharynx is clear.  Eyes:     Extraocular Movements: Extraocular movements intact.     Conjunctiva/sclera: Conjunctivae normal.     Pupils: Pupils are equal, round, and reactive to light.  Cardiovascular:     Rate and Rhythm: Normal rate and regular rhythm.     Pulses: Normal pulses.     Heart sounds: Normal heart sounds.  Pulmonary:     Effort: Pulmonary effort is normal.     Breath sounds: Normal breath sounds.  Abdominal:     Palpations: Abdomen is soft.     Tenderness: There is no abdominal tenderness.  Musculoskeletal:     Cervical back: No tenderness.  Lymphadenopathy:     Cervical: No cervical adenopathy.  Skin:    General: Skin is warm and dry.     Capillary Refill: Capillary refill takes less than 2 seconds.  Neurological:     General: No focal deficit present.     Mental Status: He is alert and oriented to person, place, and time.  Psychiatric:        Mood and Affect: Mood normal.        Behavior: Behavior normal.    Results for orders placed or performed in visit on 03/12/24 (from the past 24 hours)  POCT glycosylated hemoglobin (Hb A1C)     Status: Abnormal   Collection Time:  03/12/24  9:48 AM  Result Value Ref Range   Hemoglobin A1C 6.1 (A) 4.0 - 5.6 %   HbA1c POC (<> result, manual entry)     HbA1c, POC (prediabetic range)     HbA1c, POC (controlled diabetic range)       ASSESSMENT & PLAN: A total of 45 minutes was spent with the patient and counseling/coordination of care regarding preparing for this visit, review of most recent office visit notes, review of multiple chronic medical conditions and their management, cardiovascular risks associated with hypertension and diabetes, review of all medications, review of most recent bloodwork results including interpretation of today's hemoglobin A1c, review of health maintenance items, education on nutrition, prognosis, documentation, and need for follow up.   Problem List Items Addressed This Visit       Cardiovascular and Mediastinum   Hypertension associated with diabetes (HCC) - Primary   BP Readings from Last 3 Encounters:  03/12/24 (!) 138/96  04/07/23 130/80  02/10/23 128/72  Elevated blood pressure reading in the office today but normal readings at home Continue lisinopril  20 mg daily Well-controlled diabetes with hemoglobin A1c of 6.1 Cardiovascular risks associated with diabetes and hypertension discussed Diet and nutrition discussed Blood work and urine today Follow-up in 6 months       Relevant Orders   Microalbumin / creatinine urine ratio   POCT glycosylated hemoglobin (Hb A1C)   CBC with Differential/Platelet   Comprehensive metabolic panel with GFR   Lipid panel     Respiratory   OSA on CPAP   Very compliant with CPAP but last night did not use it and woke up with a headache this morning Headache making his blood pressure go up        Endocrine   Dyslipidemia associated with type 2 diabetes mellitus (HCC)   Chronic stable condition Hemoglobin A1c is 6.1 Continue metformin  500 mg twice a day and rosuvastatin  10  mg daily Lipid profile and CMP done today Diet and nutrition  discussed      Relevant Orders   Microalbumin / creatinine urine ratio   POCT glycosylated hemoglobin (Hb A1C)   CBC with Differential/Platelet   Comprehensive metabolic panel with GFR   Lipid panel     Other   Morbid obesity (HCC)   Wt Readings from Last 3 Encounters:  03/12/24 251 lb (113.9 kg)  04/07/23 255 lb 4 oz (115.8 kg)  02/10/23 259 lb (117.5 kg)  Diet and nutrition discussed Benefits of exercise discussed Advised to decrease amount of daily carbohydrate intake and daily calories and increase amount of plant based protein in his diet           Emil Schaumann, MD [facility]

## 2024-03-12 NOTE — Assessment & Plan Note (Signed)
 Wt Readings from Last 3 Encounters:  03/12/24 251 lb (113.9 kg)  04/07/23 255 lb 4 oz (115.8 kg)  02/10/23 259 lb (117.5 kg)  Diet and nutrition discussed Benefits of exercise discussed Advised to decrease amount of daily carbohydrate intake and daily calories and increase amount of plant based protein in his diet

## 2024-05-25 ENCOUNTER — Other Ambulatory Visit: Payer: Self-pay | Admitting: Emergency Medicine

## 2024-05-25 DIAGNOSIS — I152 Hypertension secondary to endocrine disorders: Secondary | ICD-10-CM

## 2024-09-10 ENCOUNTER — Ambulatory Visit: Admitting: Emergency Medicine
